# Patient Record
Sex: Female | Born: 1940
Health system: Southern US, Community
[De-identification: ages and names within clinical notes are randomized; demographics above are authoritative.]

## PROBLEM LIST (undated history)

## (undated) DIAGNOSIS — Z860100 Personal history of colon polyps, unspecified: Secondary | ICD-10-CM

## (undated) DIAGNOSIS — R110 Nausea: Secondary | ICD-10-CM

## (undated) DIAGNOSIS — F419 Anxiety disorder, unspecified: Secondary | ICD-10-CM

## (undated) DIAGNOSIS — R21 Rash and other nonspecific skin eruption: Secondary | ICD-10-CM

## (undated) DIAGNOSIS — I493 Ventricular premature depolarization: Secondary | ICD-10-CM

## (undated) DIAGNOSIS — K219 Gastro-esophageal reflux disease without esophagitis: Secondary | ICD-10-CM

## (undated) DIAGNOSIS — M549 Dorsalgia, unspecified: Secondary | ICD-10-CM

## (undated) DIAGNOSIS — C50919 Malignant neoplasm of unspecified site of unspecified female breast: Secondary | ICD-10-CM

## (undated) DIAGNOSIS — R351 Nocturia: Secondary | ICD-10-CM

## (undated) DIAGNOSIS — Z9889 Other specified postprocedural states: Secondary | ICD-10-CM

## (undated) DIAGNOSIS — E039 Hypothyroidism, unspecified: Secondary | ICD-10-CM

## (undated) DIAGNOSIS — L509 Urticaria, unspecified: Secondary | ICD-10-CM

## (undated) DIAGNOSIS — F329 Major depressive disorder, single episode, unspecified: Secondary | ICD-10-CM

## (undated) DIAGNOSIS — K5790 Diverticulosis of intestine, part unspecified, without perforation or abscess without bleeding: Secondary | ICD-10-CM

## (undated) DIAGNOSIS — M1990 Unspecified osteoarthritis, unspecified site: Secondary | ICD-10-CM

## (undated) DIAGNOSIS — R0602 Shortness of breath: Secondary | ICD-10-CM

## (undated) DIAGNOSIS — Z9289 Personal history of other medical treatment: Secondary | ICD-10-CM

## (undated) DIAGNOSIS — R350 Frequency of micturition: Secondary | ICD-10-CM

## (undated) DIAGNOSIS — I10 Essential (primary) hypertension: Secondary | ICD-10-CM

## (undated) DIAGNOSIS — T7840XA Allergy, unspecified, initial encounter: Secondary | ICD-10-CM

## (undated) DIAGNOSIS — F32A Depression, unspecified: Secondary | ICD-10-CM

## (undated) DIAGNOSIS — E785 Hyperlipidemia, unspecified: Secondary | ICD-10-CM

## (undated) DIAGNOSIS — R42 Dizziness and giddiness: Secondary | ICD-10-CM

## (undated) DIAGNOSIS — G8929 Other chronic pain: Secondary | ICD-10-CM

## (undated) DIAGNOSIS — R112 Nausea with vomiting, unspecified: Secondary | ICD-10-CM

## (undated) DIAGNOSIS — Z8619 Personal history of other infectious and parasitic diseases: Secondary | ICD-10-CM

## (undated) HISTORY — DX: Allergy, unspecified, initial encounter: T78.40XA

## (undated) HISTORY — DX: Malignant neoplasm of unspecified site of unspecified female breast: C50.919

## (undated) HISTORY — PX: OTHER SURGICAL HISTORY: SHX169

## (undated) HISTORY — PX: COLONOSCOPY: SHX174

## (undated) HISTORY — DX: Ventricular premature depolarization: I49.3

## (undated) HISTORY — DX: Personal history of other medical treatment: Z92.89

## (undated) HISTORY — DX: Rash and other nonspecific skin eruption: R21

## (undated) HISTORY — PX: HEMORRHOID SURGERY: SHX153

## (undated) HISTORY — DX: Urticaria, unspecified: L50.9

---

## 1970-07-05 HISTORY — PX: BREAST SURGERY: SHX581

## 1999-07-06 HISTORY — PX: ABDOMINAL HYSTERECTOMY: SHX81

## 2001-03-24 ENCOUNTER — Ambulatory Visit (HOSPITAL_COMMUNITY): Admission: RE | Admit: 2001-03-24 | Discharge: 2001-03-24 | Payer: Self-pay | Admitting: Obstetrics and Gynecology

## 2001-03-24 ENCOUNTER — Encounter: Payer: Self-pay | Admitting: Obstetrics and Gynecology

## 2001-04-18 ENCOUNTER — Other Ambulatory Visit: Admission: RE | Admit: 2001-04-18 | Discharge: 2001-04-18 | Payer: Self-pay | Admitting: Obstetrics and Gynecology

## 2001-06-09 ENCOUNTER — Ambulatory Visit (HOSPITAL_COMMUNITY): Admission: RE | Admit: 2001-06-09 | Discharge: 2001-06-09 | Payer: Self-pay | Admitting: Internal Medicine

## 2002-04-09 ENCOUNTER — Encounter: Payer: Self-pay | Admitting: Family Medicine

## 2002-04-09 ENCOUNTER — Ambulatory Visit (HOSPITAL_COMMUNITY): Admission: RE | Admit: 2002-04-09 | Discharge: 2002-04-09 | Payer: Self-pay | Admitting: Family Medicine

## 2003-05-17 ENCOUNTER — Ambulatory Visit (HOSPITAL_COMMUNITY): Admission: RE | Admit: 2003-05-17 | Discharge: 2003-05-17 | Payer: Self-pay | Admitting: General Surgery

## 2003-06-01 ENCOUNTER — Emergency Department (HOSPITAL_COMMUNITY): Admission: EM | Admit: 2003-06-01 | Discharge: 2003-06-01 | Payer: Self-pay | Admitting: Emergency Medicine

## 2003-06-06 ENCOUNTER — Ambulatory Visit (HOSPITAL_COMMUNITY): Admission: RE | Admit: 2003-06-06 | Discharge: 2003-06-06 | Payer: Self-pay | Admitting: Internal Medicine

## 2004-01-20 ENCOUNTER — Ambulatory Visit (HOSPITAL_COMMUNITY): Admission: RE | Admit: 2004-01-20 | Discharge: 2004-01-20 | Payer: Self-pay | Admitting: Family Medicine

## 2004-11-27 ENCOUNTER — Ambulatory Visit (HOSPITAL_COMMUNITY): Admission: RE | Admit: 2004-11-27 | Discharge: 2004-11-27 | Payer: Self-pay | Admitting: Family Medicine

## 2005-02-04 ENCOUNTER — Ambulatory Visit: Payer: Self-pay | Admitting: Internal Medicine

## 2005-02-11 ENCOUNTER — Ambulatory Visit (HOSPITAL_COMMUNITY): Admission: RE | Admit: 2005-02-11 | Discharge: 2005-02-11 | Payer: Self-pay | Admitting: Internal Medicine

## 2005-02-11 ENCOUNTER — Ambulatory Visit: Payer: Self-pay | Admitting: Internal Medicine

## 2007-03-31 ENCOUNTER — Ambulatory Visit (HOSPITAL_COMMUNITY): Admission: RE | Admit: 2007-03-31 | Discharge: 2007-03-31 | Payer: Self-pay | Admitting: Internal Medicine

## 2007-07-06 HISTORY — PX: TRANSTHORACIC ECHOCARDIOGRAM: SHX275

## 2008-12-30 ENCOUNTER — Ambulatory Visit (HOSPITAL_COMMUNITY): Admission: RE | Admit: 2008-12-30 | Discharge: 2008-12-30 | Payer: Self-pay | Admitting: Family Medicine

## 2009-07-05 DIAGNOSIS — Z8619 Personal history of other infectious and parasitic diseases: Secondary | ICD-10-CM

## 2009-07-05 HISTORY — DX: Personal history of other infectious and parasitic diseases: Z86.19

## 2010-11-03 DIAGNOSIS — Z9289 Personal history of other medical treatment: Secondary | ICD-10-CM

## 2010-11-03 HISTORY — DX: Personal history of other medical treatment: Z92.89

## 2010-11-20 NOTE — Op Note (Signed)
   NAME:  Leamer, SHANVI MOYD                          ACCOUNT NO.:  [id number]   MEDICAL RECORD NO.:  [id number]                   PATIENT TYPE:  AMB   LOCATION:  DAY                                  FACILITY:  [facility]   PHYSICIAN:  Dalia Heading, M.D.               DATE OF BIRTH:  May 12, 1941   DATE OF PROCEDURE:  05/17/2003  DATE OF DISCHARGE:                                 OPERATIVE REPORT   PREOPERATIVE DIAGNOSIS:  Hemorrhoidal disease.   POSTOPERATIVE DIAGNOSIS:  Hemorrhoidal disease.   PROCEDURE:  Extensive hemorrhoidectomy.   SURGEON:  Dalia Heading, M.D.   ANESTHESIA:  General endotracheal.   INDICATIONS FOR PROCEDURE:  The patient is a 70 year old white female who  presents for treatment of intractable hemorrhoidal disease.  The risks and  benefits of the procedure, including bleeding, infection, and recurrence of  the hemorrhoidal disease were fully explained to the patient who gave  informed consent.   DESCRIPTION OF PROCEDURE:  The patient was placed in the lithotomy position  after the induction of general endotracheal anesthesia.  The perineum was  prepped and draped using the usual sterile technique with Betadine.  Surgical site confirmation was performed.   The patient had previous hemorrhoid surgery along the left aspect of the  anus.  She had internal and external hemorrhoid disease at both the 7  o'clock and 11 o'clock positions.  Both internal and external hemorrhoids in  these areas were excised.  Any bleeding was controlled using Bovie  electrocautery.  The mucosal edges were reapproximated using 2-0 Vicryl  running suture.  Sensorcaine 0.5% was instilled into the surrounding  perineum.  No other mass lesions were noted.  Surgicel and viscous Xylocaine  packing was placed into the rectum.   The patient tolerated the procedure well.  The patient was extubated in the  operating room and went back to the recovery room awake and in stable  condition.   COMPLICATIONS:  None.    SPECIMENS:  Hemorrhoids.   ESTIMATED BLOOD LOSS:  Minimal.      ___________________________________________                                            Dalia Heading, M.D.   MAJ/MEDQ  D:  05/17/2003  T:  05/17/2003  Job:  [id number]   cc:   Patrica Duel, M.D.  [address]  [place]  [place] [address]  Fax: [phone]

## 2010-11-20 NOTE — Op Note (Signed)
NAME:  Jane Wilkerson, Jane Wilkerson                ACCOUNT NO.:  [id number]   MEDICAL RECORD NO.:  [id number]          PATIENT TYPE:  AMB   LOCATION:  DAY                           FACILITY:  [facility]   PHYSICIAN:  Lionel December, M.D.    DATE OF BIRTH:  April 22, 1941   DATE OF PROCEDURE:  02/11/2005  DATE OF DISCHARGE:                                 OPERATIVE REPORT   PROCEDURE:  Colonoscopy.   INDICATIONS:  [name] is a 70 year old Caucasian female with a family history  of colon carcinoma in her mother.  Her last colonoscopy was in June 2000.  On that exam, she had few sigmoid diverticula and four polyps, all of which  were hyperplastic.  She is complaining of intermittent LUQ pain with some  change in her bowel habits.  It is suspected that she may have IBS.  The  procedure risks were reviewed with the patient and informed consent was  obtained.   PREMEDICATION:  Demerol 50 mg IV Versed 5 mg IV.   FINDINGS:  Procedure performed in endoscopy suite.  The patient's vital  signs and O2 saturation were monitored during the procedure and remained  stable.  The patient was placed left lateral recumbent position and a rectal  examination performed.  No abnormality noted on external or digital exam.  The Olympus video scope was placed in the rectum and advanced under vision  into sigmoid colon and beyond.  The preparation was felt to be satisfactory.  She had few tiny diverticula at sigmoid colon.  As the scope was passed  across the splenic flexure, she was complaining of some discomfort, the same  pain that she has had off and on recently.  The scope was advanced into the  cecum, which was identified by ileocecal valve and appendiceal orifice.  Pictures taken for the record.  As the scope was withdrawn, the colonic  mucosa was carefully examined.  There was a tiny polyp in mid-transverse  colon, which was seen on the way out; however, as I got ready to remove it  via cold biopsy, I was not able to find it.  Mucosa of the rest of the colon was normal.  Rectal mucosa similarly was  normal.  The scope was retroflexed to examine anorectal junction, which was  unremarkable.  Endoscope was straightened and withdrawn.  The patient  tolerated the procedure well.   FINAL DIAGNOSES:  1.  Few tiny diverticula at sigmoid colon.  2.  Tiny polyp noted at midtransverse colon but could not be found again to      remove it.  It appeared to be hyperplastic.   RECOMMENDATIONS:  1.  High-fiber diet.  2.  Continue Levsin SL t.i.d. p.r.n.  3.  If she remains with pain, will consider abdominal-pelvic CT.  She will      call our office with a progress report in three to four weeks.       NR/MEDQ  D:  02/11/2005  T:  02/11/2005  Job:  [id number]   cc:   Patrica Duel, M.D.  [address], [address]  [place] [address]  Fax: [phone]

## 2010-11-20 NOTE — H&P (Signed)
NAME:  Jane Wilkerson                ACCOUNT NO.:  [id number]   MEDICAL RECORD NO.:  [id number]          PATIENT TYPE:  AMB   LOCATION:                                FACILITY:  [facility]   PHYSICIAN:  R. Roetta Sessions, M.D. DATE OF BIRTH:  October 12, 1940   DATE OF ADMISSION:  DATE OF DISCHARGE:  LH                                HISTORY & PHYSICAL   PRIMARY CARE PHYSICIAN:  Dr. Nobie Putnam.   CHIEF COMPLAINT:  Colonoscopy, family history of colon cancer.   HISTORY OF PRESENT ILLNESS:  Jane Wilkerson is a 70 year old Caucasian female,  patient of Dr. Patty Sermons, with history of irritable bowel syndrome. She  presents today requesting followup colonoscopy which was due last year. She  had initial colonoscopy by Dr. Karilyn Cota on December 26, 1998. She was found to  have a few small diverticula in the sigmoid colon, four small polyps - two  at the transverse and two at the rectum were benign and hyperplastic. She  has continued to do well. More recently, she has been under a significant  amount of stress at work. She has noticed some aching left upper quadrant  and left sided abdominal pain that lasts a few minutes generally. She also  has had some diarrhea and has been on antibiotics recently. She continues to  complain of postprandial fecal urgency with bowel movements about two to  three times a day. Denies any rectal bleeding, melena, nausea, or vomiting.   PAST MEDICAL HISTORY:  1.  Hypertension.  2.  GERD.  3.  IBS.  4.  Panic attacks.  5.  Hypercholesterolemia  6.  Hypothyroidism.  7.  Arthritis.  8.  Complete hysterectomy with bladder tack and hemorrhoidectomy.  9.  She had an EGD June 09, 2001 by Dr. Karilyn Cota. She was found to have      ulcerative esophagitis with small hiatal hernia.   CURRENT MEDICATIONS:  1.  Estradiol 0.5 mg daily.  2.  Avapro 150 mg daily.  3.  Ziac 2.5 mg daily.  4.  Nexium 40 mg b.i.d.  5.  Lorazepam 1 mg p.r.n.  6.  Zocor 20 mg daily.  7.  Allegra 180 mg daily.   ALLERGIES:  PENICILLIN.   FAMILY HISTORY:  Positive for mother diagnosed with colon cancer at age 45.  She has one son with hypertension and one son with diabetes mellitus. Father  deceased at age 32.   SOCIAL HISTORY:  [name] has been married for 44 years. She has three  children. She is employed full time with [employer]. She denies any  tobacco or drug use. She reports occasional alcohol use.   REVIEW OF SYSTEMS:  CONSTITUTIONAL:  Weight is stable. She is complaining of  some anorexia. Denies any fever or chills. CARDIOVASCULAR:  Denies any chest  pain or palpitations. PULMONARY:  Denies shortness of breath, dyspnea,  cough, or hemoptysis. GASTROINTESTINAL:  See HPI. She does have history of  chronic GERD. Her symptoms are well controlled on Nexium 40 mg daily.  EXTREMITIES:  She is complaining of frequent leg cramps, especially  while at  work, in both legs. Potassium was recently checked by her primary care  Jeanmarie Mccowen which was normal.   PHYSICAL EXAMINATION:  VITAL SIGNS:  Weight 149 pounds, height 62 inches,  temperature 98.2, blood pressure 122/80, pulse 64.  GENERAL:  [name] is a 70 year old Caucasian female who is alert,  oriented, pleasant, cooperative in no acute distress.  HEENT:  Sclerae are clear, nonicteric. Conjunctivae pink. Oropharynx pink  and moist without any lesions.  NECK:  Supple without any masses or thyromegaly.  CHEST:  Heart regular rate and rhythm with normal S1 and S2 without murmurs,  clicks, rubs, or gallops. Lungs are clear to auscultation bilaterally.  ABDOMEN:  Positive bowel sounds x4. No bruits auscultated. Soft, nontender,  nondistended. ________________ hepatosplenomegaly. No rebound tenderness or  guarding.  RECTAL:  Deferred.  EXTREMITIES:  Without clubbing or edema bilaterally.  SKIN:  Pink, warm and dry without any rash or jaundice.   IMPRESSION:  [name] is a 70 year old Caucasian female with a family  history of colon  cancer. She is due for repeat colonoscopy. She has a  history of IBS and has been under a significant amount of stress lately. She  is having some left upper quadrant and left sided abdominal pain and loose  stools which I suspect is due to her IBS. We will give a trial of  antispasmodics and see how she does. She also has chronic GERD which is well  controlled.   PLAN:  1.  Prescription for Levsin 1 sublingual up to t.i.d. p.r.n. abdominal      cramping and diarrhea #60 with 1 refill.  2.  She is to continue Nexium 40 mg b.i.d.  3.  Will schedule colonoscopy with Dr. Karilyn Cota in the near future. I have      discussed the procedure including the risks and benefits which include      but are not limited to bleeding, infection, perforation, and drug      reaction. She agrees with this plan, and consent will be obtained.       KC/MEDQ  D:  02/04/2005  T:  02/04/2005  Job:  [id number]   cc:   Patrica Duel, M.D.  [address]  [place]  [place] [address]  Fax: [phone]

## 2010-11-20 NOTE — H&P (Signed)
NAME:  Jane Wilkerson, Jane Wilkerson NO.:  [id number]   MEDICAL RECORD NO.:  [id number]                  PATIENT TYPE:   LOCATION:                                       FACILITY:   PHYSICIAN:  Dalia Heading, M.D.               DATE OF BIRTH:   DATE OF ADMISSION:  DATE OF DISCHARGE:                                HISTORY & PHYSICAL   CHIEF COMPLAINT:  Hemorrhoidal disease.   HISTORY OF PRESENT ILLNESS:  The patient is a 70 year old white female who  was referred for evaluation treatment of intractable hemorrhoidal disease.  She has had hemorrhoidal disease for years, but recently it has become more  prominent and painful.  It is made worse with constipation or straining.  Medications have not been helpful.  No bleeding is noted.  She had a  colonoscopy several years ago which was unremarkable.   PAST MEDICAL HISTORY:  Includes:  1. Reflux disease.  2. Hypertension.  3. Hypothyroidism.   PAST SURGICAL HISTORY:  Hysterectomy.   CURRENT MEDICATIONS:  1. Prevacid 30 mg p.o. every day.  2. Meprobamate 400 mg p.o. every day.  3. Synthroid 0.05 mg p.o. every day.  4. Avapro 1/2 tablet p.o. every day.  5. Estradiol 1 mg p.o. every day.  6. Ziac 2.5 mg p.o. every day.  7. Zocor 20 mg p.o. every day.   ALLERGIES:  PENICILLIN though she is unsure of her reaction from many years  ago.   REVIEW OF SYSTEMS:  Patient denies drinking or smoking.  She denies any  other cardiopulmonary difficulties except as listed.   PHYSICAL EXAMINATION:  GENERAL:  The patient is a well-developed, well-  nourished, white female in no acute distress.  VITAL SIGNS:  She is afebrile and vital signs are stable.  LUNGS:  Clear to auscultation with equal breath sounds bilaterally.  HEART:  Regular, rate and rhythm without S3, S4, or murmurs.  ABDOMEN:  Benign.  RECTAL:  Reveals hemorrhoidal disease circumferentially but most prominently  along the right lateral aspect.  Internal and  external hemorrhoidal disease  is noted in this area.  No other masses are noted.  The stool is hemoccult  negative.   IMPRESSION:  Hemorrhoidal disease.   PLAN:  The patient is scheduled for a hemorrhoidectomy on May 17, 2003.  The risks and benefits of the procedure including bleeding, infection, and  recurrence of the hemorrhoidal disease were fully explained to the patient,  gave informed consent.     ___________________________________________                                         Dalia Heading, M.D.   MAJ/MEDQ  D:  05/14/2003  T:  05/14/2003  Job:  [id number]   cc:   Patrica Duel, M.D.  [address], [address]  [place]  [place] [address]  Fax: [phone]

## 2011-04-21 ENCOUNTER — Other Ambulatory Visit (HOSPITAL_COMMUNITY): Payer: Self-pay | Admitting: Internal Medicine

## 2011-04-21 DIAGNOSIS — Z139 Encounter for screening, unspecified: Secondary | ICD-10-CM

## 2011-04-27 ENCOUNTER — Other Ambulatory Visit (HOSPITAL_COMMUNITY): Payer: Self-pay

## 2011-04-27 ENCOUNTER — Ambulatory Visit (HOSPITAL_COMMUNITY): Payer: Self-pay

## 2011-04-28 ENCOUNTER — Other Ambulatory Visit (HOSPITAL_COMMUNITY): Payer: Self-pay

## 2011-05-04 ENCOUNTER — Ambulatory Visit (HOSPITAL_COMMUNITY)
Admission: RE | Admit: 2011-05-04 | Discharge: 2011-05-04 | Disposition: A | Discharge status: Home / Self Care | Payer: Medicare Other | Source: Ambulatory Visit | Attending: Internal Medicine | Admitting: Internal Medicine

## 2011-05-04 DIAGNOSIS — Z139 Encounter for screening, unspecified: Secondary | ICD-10-CM

## 2011-05-04 DIAGNOSIS — Z780 Asymptomatic menopausal state: Secondary | ICD-10-CM | POA: Insufficient documentation

## 2011-05-04 DIAGNOSIS — M899 Disorder of bone, unspecified: Secondary | ICD-10-CM | POA: Insufficient documentation

## 2011-05-04 DIAGNOSIS — Z1231 Encounter for screening mammogram for malignant neoplasm of breast: Secondary | ICD-10-CM | POA: Insufficient documentation

## 2011-05-04 DIAGNOSIS — Z13820 Encounter for screening for osteoporosis: Secondary | ICD-10-CM | POA: Insufficient documentation

## 2011-05-13 ENCOUNTER — Other Ambulatory Visit: Payer: Self-pay | Admitting: Internal Medicine

## 2011-05-13 DIAGNOSIS — R928 Other abnormal and inconclusive findings on diagnostic imaging of breast: Secondary | ICD-10-CM

## 2011-06-02 ENCOUNTER — Ambulatory Visit (HOSPITAL_COMMUNITY)
Admission: RE | Admit: 2011-06-02 | Discharge: 2011-06-02 | Disposition: A | Discharge status: Home / Self Care | Payer: Medicare Other | Source: Ambulatory Visit | Attending: Internal Medicine | Admitting: Internal Medicine

## 2011-06-02 ENCOUNTER — Other Ambulatory Visit: Payer: Self-pay | Admitting: Internal Medicine

## 2011-06-02 DIAGNOSIS — R921 Mammographic calcification found on diagnostic imaging of breast: Secondary | ICD-10-CM

## 2011-06-02 DIAGNOSIS — R928 Other abnormal and inconclusive findings on diagnostic imaging of breast: Secondary | ICD-10-CM

## 2011-06-08 ENCOUNTER — Ambulatory Visit
Admission: RE | Admit: 2011-06-08 | Discharge: 2011-06-08 | Disposition: A | Discharge status: Home / Self Care | Payer: Medicare Other | Source: Ambulatory Visit | Attending: Internal Medicine | Admitting: Internal Medicine

## 2011-06-08 DIAGNOSIS — R921 Mammographic calcification found on diagnostic imaging of breast: Secondary | ICD-10-CM

## 2011-06-09 ENCOUNTER — Other Ambulatory Visit: Payer: Self-pay | Admitting: Internal Medicine

## 2011-06-09 DIAGNOSIS — C50911 Malignant neoplasm of unspecified site of right female breast: Secondary | ICD-10-CM

## 2011-06-10 ENCOUNTER — Other Ambulatory Visit: Payer: Self-pay | Admitting: *Deleted

## 2011-06-10 ENCOUNTER — Telehealth: Payer: Self-pay | Admitting: *Deleted

## 2011-06-10 DIAGNOSIS — D0510 Intraductal carcinoma in situ of unspecified breast: Secondary | ICD-10-CM

## 2011-06-10 NOTE — Telephone Encounter (Signed)
Confirmed [facility] for 06/16/11 at 0815.  Instructions and contact information given.  

## 2011-06-15 ENCOUNTER — Ambulatory Visit
Admission: RE | Admit: 2011-06-15 | Discharge: 2011-06-15 | Disposition: A | Discharge status: Home / Self Care | Payer: Medicare Other | Source: Ambulatory Visit | Attending: Internal Medicine | Admitting: Internal Medicine

## 2011-06-15 DIAGNOSIS — C50911 Malignant neoplasm of unspecified site of right female breast: Secondary | ICD-10-CM

## 2011-06-15 MED ORDER — GADOBENATE DIMEGLUMINE 529 MG/ML IV SOLN
7.0000 mL | Freq: Once | INTRAVENOUS | Status: AC | PRN
  Administered 2011-06-15: 7 mL via INTRAVENOUS

## 2011-06-16 ENCOUNTER — Ambulatory Visit (HOSPITAL_BASED_OUTPATIENT_CLINIC_OR_DEPARTMENT_OTHER): Payer: Medicare Other

## 2011-06-16 ENCOUNTER — Ambulatory Visit
Admission: RE | Admit: 2011-06-16 | Discharge: 2011-06-16 | Disposition: A | Discharge status: Home / Self Care | Payer: Medicare Other | Source: Ambulatory Visit | Attending: Radiation Oncology | Admitting: Radiation Oncology

## 2011-06-16 ENCOUNTER — Ambulatory Visit (HOSPITAL_BASED_OUTPATIENT_CLINIC_OR_DEPARTMENT_OTHER): Payer: Medicare Other | Admitting: Oncology

## 2011-06-16 ENCOUNTER — Other Ambulatory Visit: Payer: Medicare Other | Admitting: Lab

## 2011-06-16 ENCOUNTER — Encounter: Payer: Self-pay | Admitting: Oncology

## 2011-06-16 ENCOUNTER — Ambulatory Visit (HOSPITAL_BASED_OUTPATIENT_CLINIC_OR_DEPARTMENT_OTHER): Payer: Medicare Other | Admitting: Surgery

## 2011-06-16 ENCOUNTER — Ambulatory Visit: Payer: Medicare Other | Attending: Surgery | Admitting: Physical Therapy

## 2011-06-16 ENCOUNTER — Telehealth: Payer: Self-pay | Admitting: Oncology

## 2011-06-16 ENCOUNTER — Encounter: Payer: Self-pay | Admitting: *Deleted

## 2011-06-16 ENCOUNTER — Encounter (INDEPENDENT_AMBULATORY_CARE_PROVIDER_SITE_OTHER): Payer: Self-pay | Admitting: Surgery

## 2011-06-16 VITALS — BP 150/84 | HR 69 | Temp 97.9°F | Ht 60.5 in | Wt 152.7 lb

## 2011-06-16 VITALS — BP 150/84 | HR 69 | Temp 97.9°F | Ht 64.0 in | Wt 152.7 lb

## 2011-06-16 DIAGNOSIS — C50919 Malignant neoplasm of unspecified site of unspecified female breast: Secondary | ICD-10-CM | POA: Insufficient documentation

## 2011-06-16 DIAGNOSIS — M40299 Other kyphosis, site unspecified: Secondary | ICD-10-CM | POA: Insufficient documentation

## 2011-06-16 DIAGNOSIS — Z800 Family history of malignant neoplasm of digestive organs: Secondary | ICD-10-CM | POA: Insufficient documentation

## 2011-06-16 DIAGNOSIS — Z170 Estrogen receptor positive status [ER+]: Secondary | ICD-10-CM | POA: Insufficient documentation

## 2011-06-16 DIAGNOSIS — D0510 Intraductal carcinoma in situ of unspecified breast: Secondary | ICD-10-CM

## 2011-06-16 DIAGNOSIS — R293 Abnormal posture: Secondary | ICD-10-CM | POA: Insufficient documentation

## 2011-06-16 DIAGNOSIS — D0590 Unspecified type of carcinoma in situ of unspecified breast: Secondary | ICD-10-CM | POA: Insufficient documentation

## 2011-06-16 DIAGNOSIS — C50419 Malignant neoplasm of upper-outer quadrant of unspecified female breast: Secondary | ICD-10-CM

## 2011-06-16 DIAGNOSIS — E039 Hypothyroidism, unspecified: Secondary | ICD-10-CM | POA: Insufficient documentation

## 2011-06-16 DIAGNOSIS — D0511 Intraductal carcinoma in situ of right breast: Secondary | ICD-10-CM | POA: Insufficient documentation

## 2011-06-16 DIAGNOSIS — Z853 Personal history of malignant neoplasm of breast: Secondary | ICD-10-CM

## 2011-06-16 DIAGNOSIS — Z79899 Other long term (current) drug therapy: Secondary | ICD-10-CM | POA: Insufficient documentation

## 2011-06-16 DIAGNOSIS — C50411 Malignant neoplasm of upper-outer quadrant of right female breast: Secondary | ICD-10-CM

## 2011-06-16 DIAGNOSIS — IMO0001 Reserved for inherently not codable concepts without codable children: Secondary | ICD-10-CM | POA: Insufficient documentation

## 2011-06-16 LAB — COMPREHENSIVE METABOLIC PANEL
ALT: 14 U/L (ref 0–35)
Albumin: 4.1 g/dL (ref 3.5–5.2)
BUN: 10 mg/dL (ref 6–23)
CO2: 32 mEq/L (ref 19–32)
Calcium: 9.9 mg/dL (ref 8.4–10.5)
Chloride: 97 mEq/L (ref 96–112)
Creatinine, Ser: 1.14 mg/dL — ABNORMAL HIGH (ref 0.50–1.10)
Potassium: 3.5 mEq/L (ref 3.5–5.3)

## 2011-06-16 LAB — CBC WITH DIFFERENTIAL/PLATELET
BASO%: 0.5 % (ref 0.0–2.0)
Basophils Absolute: 0 10*3/uL (ref 0.0–0.1)
HCT: 37.2 % (ref 34.8–46.6)
HGB: 12.6 g/dL (ref 11.6–15.9)
MONO#: 0.8 10*3/uL (ref 0.1–0.9)
NEUT%: 62.9 % (ref 38.4–76.8)
WBC: 6.1 10*3/uL (ref 3.9–10.3)
lymph#: 1.3 10*3/uL (ref 0.9–3.3)

## 2011-06-16 NOTE — Progress Notes (Signed)
CC:   Jane Wilkerson, M.D. Jane Wilkerson, M.D. Jane Rear. Sherwood Gambler, MD Jane Hilty, MD  DIAGNOSIS:  Ductal carcinoma in situ of the right breast.  PREVIOUS INTERVENTION:  Right core needle biopsy 06/08/2011 revealing ductal carcinoma in situ.  HISTORY OF PRESENT ILLNESS:  Jane Wilkerson is a pleasant 70 year old female who underwent a screening mammogram.  She has found to have calcifications in the right lower inner quadrant.  These were biopsied and found to be high-grade DCIS with necrosis.  They were ER positive, PR negative.  An MRI confirmed the presence of a 2.4 x 2 x 1.2 cm mass. Nothing was seen on the left breast.  There were no enlarged lymph nodes.  She [facility] for consideration of treatment options in her newly diagnosed DCIS.  She has recovered well from her biopsy.  She is accompanied by her husband and her daughter.  She reports no pain or breast-related complaints prior to her mammogram.  PAST MEDICAL HISTORY: 1. Hypothyroidism. 2. Anxiety. 3. Eczema.  MEDICATIONS:  Aspirin, fish oil, Bystolic, estradiol, Cozaar, ProAir inhaler, levothyroxine, hydrochlorothiazide, calcium, lorazepam, Nexium, simvastatin, lidocaine, hydrocortisone cream.  ALLERGIES:  Penicillin causes a rash.  FAMILY HISTORY:  Her father had pancreatic cancer at 43.  Her mother had colon cancer at 17.  SOCIAL HISTORY:  She is retired.  She is accompanied by her husband and her daughter.  She has 3 children.  She denies any alcohol or tobacco use.  GYN HISTORY:  She had menses at 14.  She underwent menopause after her hysterectomy and has been on hormone replacement therapy for 10 years. She is currently taking estradiol.  She is GX, P3, with her 1st pregnancy at 70.  REVIEW OF SYSTEMS:  Positive for fatigue, cramping, glasses, palpitations, feet swelling, shortness of breath, three-pillow orthopnea, heartburn, history of ulcer, incontinence, breast lump,  easy bruising, arthritis, anxiety, and hypothyroidism.  All other systems were reviewed and found to be negative.  PHYSICAL EXAMINATION:  General:  She is a pleasant female in no distress, sitting comfortably on the exam room table.  Vitals Signs: Weight 152 pounds.  Height 5 feet 0 inches.  Blood pressure 150/84, pulse 69, respirations 20, temperature 97.9.  She has no palpable cervical or supraclavicular adenopathy.  She has ptotic breasts bilaterally.  She has no palpable abnormalities of her bilateral breasts.  She is alert and oriented x3.  IMPRESSION:  Ductal carcinoma in situ of right breast.  RECOMMENDATIONS:  Jane Wilkerson has a newly diagnosed ductal carcinoma in situ of the right breast.  We discussed the role of breast conservation in the management of this disease.  We discussed the role of radiation in decreasing local failures.  We discussed 30-35 treatments as an outpatient.  We discussed the process of simulation and the placement of tattoos.  Her surgery is going to be scheduled after the holidays, which I think is fine.  We discussed the excellent prognosis associated with ductal carcinoma in situ and the low likelihood of any spread to other parts of her body.  We discussed treatment in [place] versus treatment here at [facility].  She and her family are going to discuss this further.  We discussed enrollment in B43 and she will be meeting with Jane Wilkerson to discuss that as well.  We discussed possible side effects of treatment including but not limited to skin redness and fatigue.  She will follow up with me after her surgery.  I asked her again to  see me at [facility] if she wished to be treated there or here at [facility] if she wishes to be treated here.  I have also recommended that she stopped taking her estradiol.    ______________________________ Jane Wilkerson, M.D. SW/MEDQ  D:  06/16/2011  T:  06/16/2011  Job:  [id number]

## 2011-06-16 NOTE — Progress Notes (Signed)
Patient ID: Jane Wilkerson, female   DOB: 03/24/1941, 70 y.o.   MRN: [id number]  Chief Complaint  Patient presents with  . Breast Cancer    HPI Jane Wilkerson is a 70 y.o. female.   HPI This is a very pleasant female referred by Dr. Sherwood Gambler for evaluation of abnormal calcifications in her right breast. She had undergone normal screening mammography with calcifications were found. She was uncertain of her last mammograms.  She has had no problems with her breasts. She denies nipple discharge. She had a benign biopsy of the left breast in the 1970s. She is otherwise without complaints.  No past medical history on file.  Past Surgical History  Procedure Date  . Abdominal hysterectomy     Family History  Problem Relation Age of Onset  . Cancer Mother     colon  . Cancer Father     pancreatic    Social History History  Substance Use Topics  . Smoking status: Never Smoker   . Smokeless tobacco: Not on file  . Alcohol Use: No    Allergies  Allergen Reactions  . Penicillins Rash    Current Outpatient Prescriptions  Medication Sig Dispense Refill  . albuterol (PROVENTIL HFA;VENTOLIN HFA) 108 (90 BASE) MCG/ACT inhaler Inhale 2 puffs into the lungs every 6 (six) hours as needed.        Marland Kitchen aspirin 81 MG tablet Take 81 mg by mouth daily.        . Calcium Carbonate-Vit D-Min (CALCIUM 1200 PO) Take by mouth.        . esomeprazole (NEXIUM) 40 MG capsule Take 40 mg by mouth daily before breakfast.        . estradiol (VIVELLE-DOT) 0.05 MG/24HR Place 1 patch onto the skin once a week.        . fish oil-omega-3 fatty acids 1000 MG capsule Take 2 g by mouth daily.        . hydrochlorothiazide (HYDRODIURIL) 25 MG tablet Take 25 mg by mouth daily.        . hydrocortisone cream 0.5 % Apply topically 2 (two) times daily.        Marland Kitchen levothyroxine (SYNTHROID, LEVOTHROID) 50 MCG tablet Take 50 mcg by mouth daily.        Marland Kitchen LORazepam (ATIVAN) 1 MG tablet Take 1 mg by mouth every 4 (four) hours.        Marland Kitchen  losartan (COZAAR) 100 MG tablet Take 100 mg by mouth daily.        . nebivolol (BYSTOLIC) 10 MG tablet Take 10 mg by mouth daily.        . simvastatin (ZOCOR) 40 MG tablet Take 40 mg by mouth at bedtime.         No current facility-administered medications for this visit.   Facility-Administered Medications Ordered in Other Visits  Medication Dose Route Frequency Provider Last Rate Last Dose  . gadobenate dimeglumine (MULTIHANCE) injection 7 mL  7 mL Intravenous Once PRN Medication Radiologist   7 mL at 06/15/11 1004    Review of Systems Review of Systems  HENT: Positive for postnasal drip.   Respiratory: Positive for shortness of breath.   Cardiovascular: Positive for palpitations.  Gastrointestinal: Negative.   Genitourinary: Negative.   Musculoskeletal: Positive for arthralgias.  Neurological: Negative.   Hematological: Bruises/bleeds easily.  Psychiatric/Behavioral: Negative.     Blood pressure 150/84, pulse 69, temperature 97.9 F (36.6 C), height 5\' 4"  (1.626 m), weight 152 lb 11.2 oz (69.264  kg).  Physical Exam Physical Exam  Constitutional: She is oriented to person, place, and time. She appears well-developed and well-nourished. No distress.  HENT:  Head: Normocephalic and atraumatic.  Right Ear: External ear normal.  Left Ear: External ear normal.  Nose: Nose normal.  Mouth/Throat: Oropharynx is clear and moist. No oropharyngeal exudate.  Eyes: Conjunctivae are normal. Pupils are equal, round, and reactive to light. No scleral icterus.  Neck: Normal range of motion. Neck supple. No tracheal deviation present. No thyromegaly present.  Cardiovascular: Normal rate, regular rhythm, normal heart sounds and intact distal pulses.   No murmur heard. Pulmonary/Chest: Effort normal and breath sounds normal. No respiratory distress. She has no wheezes. She has no rales.  Abdominal: Soft. Bowel sounds are normal. She exhibits no distension. There is no tenderness.    Musculoskeletal: She exhibits no edema and no tenderness.  Lymphadenopathy:    She has no cervical adenopathy.    She has no axillary adenopathy.       Right: No supraclavicular adenopathy present.       Left: No supraclavicular adenopathy present.  Neurological: She is alert and oriented to person, place, and time.  Skin: Skin is warm and dry. No rash noted. No erythema.  Psychiatric: Her behavior is normal. Judgment normal.  On examination of her breast, there are no palpable masses in either breast. Areola and nipples are normal. Her biopsy incision is well-healed.  Data Reviewed I reviewed the mammograms and pathology report. Rectal mammograms and MRI, the calcifications are in the right breast for an area of enhancement of 2.4 cm. There are no other abnormalities. The final pathology showed intermediate grade ER positive ductal carcinoma in situ with some necrosis  Assessment     Patient with ductal carcinoma in situ of the right breast.    Plan    I had a long discussion with the patient and her family regarding the diagnosis. She has been discussed thoroughly in the multidisciplinary breast conference. We are recommending a needle localized lumpectomy and sentinel lymph node biopsy. I discussed this with her in detail. She was also given literature.  I did discuss conservative measures versus mastectomy with her as well. She went to proceed with needle localized lumpectomy and sentinel node biopsy. I discussed the risk of surgery which includes but is not limited to bleeding, infection, injury to surrounding structures including nerves, need for further surgery should margins be positive, etc. She understands and wishes to proceed. Likelihood of success is good.       Rhandi Despain A 06/16/2011, 9:39 AM

## 2011-06-16 NOTE — Progress Notes (Signed)
Jane Wilkerson [id number] April 01, 1941 70 y.o. 06/16/2011 11:35 AM  CC  Dr. Carman Ching  Dr. Glorious Peach., MD [address] [place] [address]  REASON FOR CONSULTATION:  70 year old female with new diagnosis of ductal carcinoma in situ of the right breast status post right needle core biopsy on 06/08/2011. Tumor was estrogen receptor +100% progesterone receptor -0% Patient was seen in the Multidisciplinary Breast Clinic for discussion of her treatment options. She was seen by Dr. Drue Second, Radiation Oncologist [facility]  STAGE: [id number]  REFERRING PHYSICIAN: Dr. [name] Miyamoto  HISTORY OF PRESENT ILLNESS:  Jane Wilkerson is a 70 y.o. female.  With medical history significant for hypothyroidism hypertension anxiety and hyperlipidemia. Patient began having screening mammograms in her 1s. She most recently presented for a screening mammogram at the breast Center on 05/04/2011. The mammogram showed calcifications in the right breast indicating additional evaluation. She went on to have a unilateral right diagnostic mammogram on 06/02/2011. There was noted to be suspicious calcifications in the right upper inner quadrant posteriorly. On 06/08/2011 patient underwent a stereotactic right breast biopsy with the pathology revealing a grade intermediate grade ductal carcinoma in situ with calcifications of the lower inner quadrant of the right breast. The tumor was ER +100% PR +0% on 06/15/2011 patient had MRI of the breasts performed bilaterally. The MRI showed biopsy-proven DCIS in the right breast at the 3:00 location there was a biopsy cavity with adjacent clumped looked nodular enhancement measuring 2.4 x 2.0 x 1.2 cm. Associated clip artifact was noted. No lymphadenopathy was identified. Patient is now seen in the multidisciplinary breast clinic for discussion of treatment options. She is without any  complaints.   Past Medical History:  #1 hypertension  #2 hypothyroidism  #3 anxiety  #4 hyperlipidemia No past medical history on file.  Past Surgical History: Past Surgical History  Procedure Date  . Abdominal hysterectomy     Family History: Family History  Problem Relation Age of Onset  . Cancer Mother     colon  . Cancer Father     pancreatic    Social History History  Substance Use Topics  . Smoking status: Never Smoker   . Smokeless tobacco: Not on file  . Alcohol Use: No    Allergies: Allergies  Allergen Reactions  . Penicillins Rash    Current Medications:  Current Outpatient Prescriptions  Medication Sig Dispense Refill  . albuterol (PROVENTIL HFA;VENTOLIN HFA) 108 (90 BASE) MCG/ACT inhaler Inhale 2 puffs into the lungs every 6 (six) hours as needed.        Marland Kitchen aspirin 81 MG tablet Take 81 mg by mouth daily.        . Calcium Carbonate-Vit D-Min (CALCIUM 1200 PO) Take by mouth.        . esomeprazole (NEXIUM) 40 MG capsule Take 40 mg by mouth daily before breakfast.        . estradiol (VIVELLE-DOT) 0.05 MG/24HR Place 1 patch onto the skin once a week.        . fish oil-omega-3 fatty acids 1000 MG capsule Take 2 g by mouth daily.        . hydrochlorothiazide (HYDRODIURIL) 25 MG tablet Take 25 mg by mouth daily.        . hydrocortisone cream 0.5 % Apply topically 2 (two) times daily.        Marland Kitchen levothyroxine (SYNTHROID, LEVOTHROID) 50 MCG tablet Take 50 mcg by mouth daily.        Marland Kitchen  LORazepam (ATIVAN) 1 MG tablet Take 1 mg by mouth every 4 (four) hours.        Marland Kitchen losartan (COZAAR) 100 MG tablet Take 100 mg by mouth daily.        . nebivolol (BYSTOLIC) 10 MG tablet Take 10 mg by mouth daily.        . simvastatin (ZOCOR) 40 MG tablet Take 40 mg by mouth at bedtime.          OB/GYN History: Menarche at age 69 she did go on hormone replacement therapy for 10 years today she has discontinued the hormone replacement therapy. Patient has 3 children Jane Wilkerson and  Jane Wilkerson she is G3 P3 00 first live birth was at the age of 25.  Fertility Discussion: Patient is postmenopausal Prior History of Cancer: She has no prior history of cancers  Health Maintenance:  Colonoscopy last colonoscopy was in 2011 Bone Density last bone density done October 2012 Last PAP smear last Pap smear was 15 years ago  Mammogram started in the 21s.  ECOG PERFORMANCE STATUS: 0 - Asymptomatic  Genetic Counseling/testing: Patient's family history is only significant for colon cancer in her mom at the age of 81 and bad with pancreatic cancer at the age of 84. Her father died at the age of 73 mother died at the age of 48. Patient has one brother who was alive and well one sister who is alive and well. There is no indication for genetic counseling or testing.  REVIEW OF SYSTEMS:  Constitutional: negative Eyes: negative Ears, nose, mouth, throat, and face: negative Respiratory: negative Cardiovascular: negative Gastrointestinal: negative Genitourinary:negative Integument/breast: positive for breast lump and breast tenderness Hematologic/lymphatic: negative Musculoskeletal:positive for arthralgias and stiff joints Neurological: negative Behavioral/Psych: negative Endocrine: negative Allergic/Immunologic: negative  PHYSICAL EXAMINATION: Blood pressure 150/84, pulse 69, temperature 97.9 F (36.6 C), temperature source Oral, height 5' 0.5" (1.537 m), weight 152 lb 11.2 oz (69.264 kg).  ZOX:WRUEA, healthy, no distress, well nourished, well developed and smiling SKIN: skin color, texture, turgor are normal, no rashes or significant lesions HEAD: Normocephalic, No masses, lesions, tenderness or abnormalities EYES: normal, PERRLA, EOMI, Conjunctiva are pink and non-injected, sclera clear EARS: External ears normal, Canals clear OROPHARYNX:no exudate, no erythema, lips, buccal mucosa, and tongue normal and dentition normal  NECK: supple, no adenopathy, no bruits, no JVD,  thyroid normal size, non-tender, without nodularity LYMPH:  no palpable lymphadenopathy, no hepatosplenomegaly BREAST:right breast normal without mass, skin or nipple changes or axillary nodes, left breast normal without mass, skin or nipple changes or axillary nodes, right breast does reveal site of recent biopsy otherwise no skin findings LUNGS: clear to auscultation , and palpation, clear to auscultation and percussion HEART: regular rate & rhythm, no murmurs, no gallops, S1 normal and S2 normal ABDOMEN:abdomen soft, non-tender, obese, normal bowel sounds and no masses or organomegaly BACK: Back symmetric, no curvature., No CVA tenderness, Range of motion is normal EXTREMITIES:less then 2 second capillary refill, no joint deformities, effusion, or inflammation, no edema, no skin discoloration, no clubbing, no cyanosis  NEURO: alert & oriented x 3 with fluent speech, no focal motor/sensory deficits, gait normal, reflexes normal and symmetric     STUDIES/RESULTS: Mr Breast Bilateral W Wo Contrast  06/15/2011  *RADIOLOGY REPORT*  Clinical Data: Newly-diagnosed right breast DCIS manifesting as mammographically detected calcifications  BUN and creatinine were obtained on site at [facility] at [address] Results:  BUN 13 mg/dL,  Creatinine 1.3 mg/dL.  BILATERAL BREAST MRI  WITH AND WITHOUT CONTRAST  Technique: Multiplanar, multisequence MR images of both breasts were obtained prior to and following the intravenous administration of 7ml of Multihance.  Three dimensional images were evaluated at the independent DynaCad workstation.  Comparison:  Prior mammograms  Findings: At the site of biopsy-proven DCIS in the right breast three o'clock location, there is a biopsy cavity with adjacent clumped nodular enhancement measuring overall 2.4 x 2.0 x 1.2 cm. Associated clip artifact is noted. This area corresponds approximately to the area of mammographically detectable calcifications.   Separate post biopsy change is noted along the expected location of the biopsy tract but this is not included in overall measurements.  Associated plateau type enhancement kinetics are noted.  No other area of abnormal enhancement is seen on either side.  No lymphadenopathy or abnormal T2-weighted hyperintensity is identified.  IMPRESSION: Solitary area of abnormal enhancement associated with post biopsy change in the right breast three o'clock location corresponding to biopsy-proven DCIS.  No MRI evidence for contralateral or multifocal/multicentric disease.  THREE-DIMENSIONAL MR IMAGE RENDERING ON INDEPENDENT WORKSTATION:  Three-dimensional MR images were rendered by post-processing of the original MR data on an independent workstation.  The three- dimensional MR images were interpreted, and findings were reported in the accompanying complete MRI report for this study.  BI-RADS CATEGORY 6:  Known biopsy-proven malignancy - appropriate action should be taken.  Recommendation:  Treatment plan  Original Report Authenticated By: Harrel Lemon, M.D.   Mm Breast Stereo Biopsy Right  06/09/2011  **ADDENDUM** CREATED: 06/09/2011 12:07:24  The pathology demonstrated DCIS.  This is concordant with imaging findings.  Dr. Jean Rosenthal discussed the findings with the patient by telephone and answered her questions.  The patient will be scheduled for the breast cancer multidisciplinary clinic on 06/16/2011.  Breast MRI is planned for 06/15/2011. The patient states that her biopsy site is clean and dry without hematoma formation.  Post biopsy wound care instructions were reviewed with the patient.  The patient was encouraged the Breast Center for additional questions or concerns.  Addended by:  Rolla Plate, M.D. on 06/09/2011 11:91:47.  **END ADDENDUM** SIGNED BY: Rolla Plate, M.D.   06/09/2011  *RADIOLOGY REPORT*  Clinical Data:  Right breast calcifications.  STEREOTACTIC-GUIDED VACUUM ASSISTED BIOPSY OF THE RIGHT  BREAST AND SPECIMEN RADIOGRAPH  I met with the patient, and we discussed the procedure of ultrasound-guided biopsy, including benefits and alternatives.  We discussed the high likelihood of a successful procedure. We discussed the risks of the procedure, including infection, bleeding, tissue injury, clip migration, and inadequate sampling.  Informed, written consent was given. Using sterile technique, 2% lidocaine, stereotactic guidance, and a 9 gauge vacuum assisted device, biopsy was performed of the calcifications located within the lower inner quadrant of the right breast.  Specimen radiograph was performed, showing representative calcifications within the specimen.  Specimens with calcifications are identified for pathology.  At the conclusion of the procedure, a T-shaped tissue marker clip was deployed into the biopsy cavity.  Follow-up 2-view mammogram confirmed clip to be in appropriate position.  IMPRESSION: Stereotactic-guided biopsy of right breast calcifications as discussed above.  No apparent complications.  Original Report Authenticated By: Rolla Plate, M.D.   Mm Breast Surgical Specimen  06/09/2011  **ADDENDUM** CREATED: 06/09/2011 12:07:24  The pathology demonstrated DCIS.  This is concordant with imaging findings.  Dr. Jean Rosenthal discussed the findings with the patient by telephone and answered her questions.  The patient will be scheduled for the breast cancer multidisciplinary clinic  on 06/16/2011.  Breast MRI is planned for 06/15/2011. The patient states that her biopsy site is clean and dry without hematoma formation.  Post biopsy wound care instructions were reviewed with the patient.  The patient was encouraged the Breast Center for additional questions or concerns.  Addended by:  Rolla Plate, M.D. on 06/09/2011 11:91:47.  **END ADDENDUM** SIGNED BY: Rolla Plate, M.D.   06/09/2011  *RADIOLOGY REPORT*  Clinical Data:  Right breast calcifications.  STEREOTACTIC-GUIDED VACUUM  ASSISTED BIOPSY OF THE RIGHT BREAST AND SPECIMEN RADIOGRAPH  I met with the patient, and we discussed the procedure of ultrasound-guided biopsy, including benefits and alternatives.  We discussed the high likelihood of a successful procedure. We discussed the risks of the procedure, including infection, bleeding, tissue injury, clip migration, and inadequate sampling.  Informed, written consent was given. Using sterile technique, 2% lidocaine, stereotactic guidance, and a 9 gauge vacuum assisted device, biopsy was performed of the calcifications located within the lower inner quadrant of the right breast.  Specimen radiograph was performed, showing representative calcifications within the specimen.  Specimens with calcifications are identified for pathology.  At the conclusion of the procedure, a T-shaped tissue marker clip was deployed into the biopsy cavity.  Follow-up 2-view mammogram confirmed clip to be in appropriate position.  IMPRESSION: Stereotactic-guided biopsy of right breast calcifications as discussed above.  No apparent complications.  Original Report Authenticated By: Rolla Plate, M.D.   Mm Digital Diagnostic Unilat R  06/02/2011  *RADIOLOGY REPORT*  Clinical Data:  The patient returns for evaluation of calcifications in the right breast noted on recent screening study dated 05/04/2011.  DIGITAL DIAGNOSTIC RIGHT MAMMOGRAM WITH CAD  Comparison:  12/30/2008  Findings:  Magnification views demonstrate calcifications in the right upper inner quadrant posteriorly that vary in size and shape. Linear forms are present.  Appearance is suspicious and biopsy is suggested.  Stereotactic core needle biopsy was discussed with the patient and she agreed with this plan.  IMPRESSION: Suspicious calcifications in the right upper inner quadrant posteriorly.  Stereotactic core needle biopsy is suggested.  This will be scheduled at a convenient time for the patient at the [facility].   BI-RADS CATEGORY 4:  Suspicious abnormality - biopsy should be considered.  Original Report Authenticated By: Daryl Eastern, M.D.     LABS:    Chemistry      Component Value Date/Time   NA 136 06/16/2011 0828   K 3.5 06/16/2011 0828   CL 97 06/16/2011 0828   CO2 32 06/16/2011 0828   BUN 10 06/16/2011 0828   CREATININE 1.14* 06/16/2011 0828      Component Value Date/Time   CALCIUM 9.9 06/16/2011 0828   ALKPHOS 57 06/16/2011 0828   AST 18 06/16/2011 0828   ALT 14 06/16/2011 0828   BILITOT 0.5 06/16/2011 0828      Lab Results  Component Value Date   WBC 6.1 06/16/2011   HGB 12.6 06/16/2011   HCT 37.2 06/16/2011   MCV 89.4 06/16/2011   PLT 299 06/16/2011       ASSESSMENT    71 year old female with new diagnosis of ductal carcinoma in situ with calcifications of the right breast. The area of concern on MRI measuring 2.4 cm in the right breast. The pathology does show a ductal carcinoma in situ with calcifications intermediate grade. The estrogen receptor on the DCIS is 100% positive progesterone receptor -0%. Patient is now seen in the multidisciplinary breast clinic for consideration of treatment  options. She was seen by Dr. Carman Ching and Dr. Lurline Hare.  Recommendations are made today by all of [place]. It is recommended that patient undergo a lumpectomy with sentinel node biopsy by Dr. Magnus Ivan. She will then go on to receive radiation therapy to the residual breast. Patient and I discussed adjuvant treatment consisting of an aromatase inhibitor or tamoxifen. Information on these 2 drugs are given to the patient.  Clinical Trial Eligibility: We also discussed clinical trial and her eligibility. Patient has DCIS and she certainly would be a candidate for the NSABP B. 43 clinical trial I discussed this with her extensively and she has agreed to participate. 1 she returns to followup with me I will have our research nurses see the patient.  Multidisciplinary  conference discussion patient's case was discussed at the month at the multidisciplinary breast clinic and conference today. The above recommendations were made as a group    PLAN:    We will proceed with her lumpectomy with sentinel node biopsy. Dr. Eliberto Ivory office will call the patient for appointment scheduling. Once her final pathology is released then we will discuss her case again at our multidisciplinary breast conference. She is also set up to see me back in about 3 weeks time to discuss further treatment options including the NSABP B- 43 clinical trial as well as ongoing adjuvant treatment and post lumpectomy and radiation.       Discussion: Patient is being treated per NCCN breast cancer care guidelines appropriate for stage. Clinical stage 0 (DCIS)   Thank you so much for allowing me to participate in the care of Jane Wilkerson. I will continue to follow up the patient with you and assist in her care.  All questions were answered. The patient knows to call the clinic with any problems, questions or concerns. We can certainly see the patient much sooner if necessary.  I spent 40 minutes counseling the patient face to face. The total time spent in the appointment was 60 minutes.  Drue Second, MD MedicalOncology [facility] [phone] (beeper) [phone] (Office)  06/16/2011, 11:35 AM 06/16/2011, 11:35 AM

## 2011-06-16 NOTE — Telephone Encounter (Signed)
gv pt appt schedule for jan. Per 1212 elec pof f/u 3wks.

## 2011-06-16 NOTE — Progress Notes (Signed)
Mailed after appt letter to pt. 

## 2011-06-16 NOTE — Patient Instructions (Addendum)
Breast Cancer, Early Stage, Surgery Choices Breast cancer is the most common cancer, except for skin cancer. It is the second most common cause of death, except for lung cancer, in women. The risk of a woman developing breast cancer is 12.5%. Breast cancer in women younger than 70 years old is rare. Most of these cancer cases have spread to the breast from another part of the body (metastatic). Finding out that you have breast cancer is an emotional shock and is difficult to understand, because it is an overwhelming, serious, and life-threatening disease. You will need to make important decisions about how you want it treated.  As a woman with early-stage breast cancer (DCIS or Stage I, IIA, IIB, IIIA, or IV) you may be able to choose which type of breast surgery to have. Your caregiver will help you understand what stage you are in. Often, your choices are:  Removal of the cancerous part(s) of the breast (breast-sparing surgery).   Lumpectomy.   Partial/Segmental mastectomy.   Removal of the whole breast (simple mastectomy). Research shows that women with early-stage breast cancer who have breast-sparing surgery along with radiation therapy live as long as those who have a mastectomy. Most women with breast cancer will lead long, healthy lives after treatment.   Reconstructive surgery. This type of surgery is to make the breast and nipple area as normal looking as it was before the mastectomy. It is usually done by a plastic surgeon at the same time as the mastectomy. There are several types of reconstructive surgery that should be discussed with your caregiver and plastic surgeon.   Lymph node surgery.   Sentinel (removing those lymph nodes in the armpit that breast cancer spreads to first).   Axillary lymph node dissection (removing all the lymph nodes in the armpit).  TREATMENT Treatment for breast cancer usually begins a few weeks after diagnosis. In these weeks, you should meet with a  surgeon, learn the facts about your surgery choices, treatment options, get a second opinion, and think about what is important to you.  Treatment choices include:  Surgery.   Radiation.   Chemotherapy.   Medicines, hormones.   Reconstructive breast surgery.   Any combination of the above treatments.  Choose which kind of surgery to have. If radiation, chemotherapy, or hormone therapy is considered, this also should be discussed before the surgery. Radical breast surgery is rarely performed now. Usually, lumpectomy, partial/segmental mastectomy, simple mastectomy in combination with radiation, chemotherapy, and/or hormone treatment is recommended. Clinical trial protocols (specific treatment plan with surgery and radiation, chemotherapy, and hormones) for breast cancer have been put in place in hospitals, universities, medical schools, and cancer centers all over the country. These patients are followed for several years to find out what treatment gives the best results for the protocol given, for the different stages of breast cancer.  Most women want to make this choice with help from their caregiver. After all, the kind of surgery you have will affect how you look and feel. But it is often hard to decide what to do. The more information you have, the better the decision you can make.  Talk to a surgeon about your breast cancer surgery choices. Find out what happens during surgery, types of problems that sometimes occur, and other kinds of treatment (if any) you will need after surgery. Be sure to ask a lot of questions and learn as much as you can. You may also wish to talk with family members, friends, or  others who have had breast cancer surgery, radiation therapy, chemotherapy, or hormone therapy.   After talking with a surgeon, you may want a second opinion. This means talking with another doctor or surgeon who might tell you about other treatment options. They may simply give you  information that can help you feel better about the choice you are making. Do not worry about hurting your surgeon's feelings. It is common practice to get a second opinion, and some insurance companies require it. Plus, it is better to get a second opinion than to worry that you have made the wrong choice.  STAGES OF BREAST CANCER Staging of breast cancer depends on the size of the tumor, if and how far it has spread, lymph node involvement in the armpits, and whether it has spread to other parts of the body. If you are unsure of the stage of your cancer, ask your caregiver. The following are the stages of breast cancer:  Stage 0: This means you either have DCIS or LCIS.   DCIS (Ductal Carcinoma in Situ) is very early breast cancer. It is often too small to form a lump. Your caregiver may refer to DCIS as noninvasive cancer.   LCIS (Lobular Carcinoma in Situ) is not cancer, but it may increase the [place] that you will get breast cancer. Talk with your caregiver about treatment options, if you are diagnosed with LCIS.   The 5 year survival rate in this stage is almost 100%.   Stage I:   Your cancer is less than 1 inch across (2 centimeters) or about the size of a quarter. The cancer is only in the breast. It has not spread to lymph nodes or other parts of your body.   Stage IIA:   No cancer is found in your breast. However, cancer is found in the lymph nodes under your arm, or   Your cancer is 1 inch (2 centimeters) or smaller. It has spread to 3 lymph nodes or less (the lymph nodes under your arm), or   Your cancer is about 1-2 inches (2-5 centimeters). It has not spread to the lymph nodes under your arm.   Stage IIB:   Your cancer is about 1-2 inches (2-5 centimeters). It has spread to the lymph nodes under your arm, or   Your cancer is larger than 2 inches (5 centimeters). It has not spread to the lymph nodes under your arm.   The 5 year survival rate is 85 to 90%.   Stage IIIA:     No cancer is found in the breast. It is found in lymph nodes under your arm. The lymph nodes are attached to each other, or   Your cancer is 2 inches (5 centimeters) or smaller. It has spread to lymph nodes under your arm. The lymph nodes are attached to each other, or   Your cancer is larger than 2 inches (5 centimeters) and has spread to lymph nodes under your arm, and they are not attached to each other.   Your cancer is smaller than 2 inches (5 centimeters) and has spread to lymph nodes above your collarbone.   Inflammatory cancer of the breast (red rash, tender and swollen breasts) is in the stage III category.   The 5 year survival rate is 45 to 67%.   Stage IV:   Cancer cells have spread to other parts of the body.   The 5 year survival rate is about 20%.  ABOUT LYMPH NODES  Lymph nodes  are part of your body's immune system, which helps fight infection and disease. Lymph nodes are small, round, and clustered (like a bunch of grapes) throughout your body.   Axillary lymph nodes are in the area under your arm. Breast cancer may spread to these lymph nodes first, even when the tumor in the breast is small. This is why most surgeons take out some of these lymph nodes at the time of breast surgery.   Lymphedema is swelling caused by a buildup of lymph fluid. You may have this type of swelling in your arm, if your lymph nodes are taken out with surgery or damaged by radiation therapy.   Lymphedema can show up soon after surgery. The symptoms are often mild and last for a short time.   Lymphedema can show up months or even years after cancer treatment is over. Often, lymphedema develops after an insect bite, minor injury, or burn on the arm where your lymph nodes were removed. Sometimes, this can be painful. One way to reduce the swelling is to work with a caregiver who specializes in rehabilitation, or with a physical therapist.   Sentinel lymph node biopsy is surgery to remove as  few lymph nodes as possible from under the arm. The surgeon first injects a dye in the breast to see which lymph nodes the breast tumor drains into. Then, he or she removes these nodes to see if they have any cancer. If there is no cancer, the surgeon may leave the other lymph nodes in place. This surgery is fairly new and is being studied experimentally. Talk with your surgeon if you want to learn more.  COMPARE YOUR CHOICES  BREAST-SPARING SURGERY  Is this surgery right for me?   Breast-sparing surgery with radiation is a safe choice for most women who have early-stage breast cancer. This means that your cancer is DCIS or at Stage I, IIA, IIB, or IIIA.  What are the names of the different kinds of surgery?  Lumpectomy.   Partial mastectomy.   Breast-sparing surgery.   Segmental mastectomy.   Simple mastectomy.   Sentinel lymph node removal.   Axillary lymph node excision.  What doctors am I likely to see?  Oncologist.   Surgeon.   Radiation Oncologist.   Chemotherapy Oncologist.  What will my breast look like after surgery?  Your breast should look a lot like it did before surgery. But if your tumor is large, your breast may look different or smaller after breast-sparing surgery.  Will I have feeling in the area around my breast?  Yes. You should still have feeling in your breast, nipple, and areola (dark area around your nipple).  Will I have pain after the surgery?  You may have pain after surgery. Talk with your caregiver about ways to control this pain.  What other problems can I expect?  You may feel very tired after radiation therapy. You may get swelling in your arm (lymphedema).  Will I need more surgery?  Maybe. You may need more surgery to remove lymph nodes from under your arm. Also, if the surgeon does not remove all your cancer the first time, you may need more surgery.  What other types of treatment will I need?  You may need radiation therapy, given  almost every day for 5 to 8 weeks. You may also need chemotherapy, hormone therapy, or both.  Will insurance pay for my surgery?  Check with your insurance company to find out how much it pays for  breast cancer surgery and other needed treatments.  Will the type of surgery I have affect how long I live?  Women with early-stage breast cancer who have breast-sparing surgery followed by radiation live just as long as women who have a mastectomy. Most women with breast cancer will lead long, healthy lives after treatment.  What are the chances that my cancer will come back after surgery?  About 10% of women who have breast-sparing surgery along with radiation therapy get cancer in the same breast within 12 years. If this happens, you will need a mastectomy, but it will not affect how long you live.  MASTECTOMY SURGERY Is this surgery right for me?   Mastectomy is a safe choice for women who have early-stage breast cancer (DCIS, Stage I, IIA, IIB, or IIIA). You may need a mastectomy if:   You have small breasts and a large tumor.   You have cancer in more than one part of your breast.   The tumor is under the nipple.   You do not have access to radiation therapy.  What are the names of the different kinds of surgery?  Total mastectomy.   Modified radical mastectomy (not usually done now).   Double mastectomy.   Simple mastectomy.  What doctors am I likely to see?  Oncologist.   Surgeon.   Radiation Oncologist.   Chemotherapy Oncologist.  What will my breast look like after surgery?  Your breast and nipple will be removed. You will have a flat chest on the side of your body where the breast was removed.  Will I have feeling in the area around my breast?  Maybe. After surgery, you will have no feeling (numb) in your chest wall and maybe also under your arm. This numb feeling should go away in 1-2 years, but it will never feel like it did before. Also, the skin where your breast  was may feel tight.  Will I have pain after the surgery?  You may have pain after surgery. Talk with your caregiver about ways to control this pain.  What other problems can I expect?  You may have pain in your neck or back. You may feel out of balance, if you had large breasts and do not have reconstruction surgery. You may get swelling in your arm (lymphedema).  Will I need more surgery?  Maybe. You may need surgery to remove lymph nodes from under your arm. Also, if you have problems after your mastectomy, you may need to see your surgeon for treatment.  What other types of treatment will I need?  You may also need chemotherapy, hormone therapy, or radiation therapy. Some women get all 3 types of therapy or any combination of the 3.  Will insurance pay for my surgery?  Check with your insurance company to find out how much it pays for breast cancer surgery and other needed treatments.  Will the type of surgery I have affect how long I live?  Women with early-stage breast cancer who have a mastectomy live as long as women who have breast-sparing surgery followed by radiation therapy. Most women with breast cancer will lead long, healthy lives after treatment.  What are the chances that my cancer will come back after surgery?  About 5% of women who have a mastectomy will get cancer on the same side of their chest within 12 years.  MASTECTOMY AND BREAST RECONSTRUCTION SURGERY  Is this surgery right for me?  If you have a mastectomy, you might  also want breast reconstruction surgery.   You can choose to have reconstruction surgery at the same time as your mastectomy.   You can wait and have it at a later date.  What are the names of the different kinds of surgery?  Breast implant.   Tissue flap surgery.   Reconstructive plastic surgery.  What doctors am I likely to see?  Oncologist.   Surgeon.   Radiation Oncologist.   Reconstructive Plastic Surgeon.   Chemotherapy  Oncologist.  What will my breast look like after surgery?  Although you will have a breast-like shape, your breast will not look the same as it did before surgery.  Will I have feeling in the area around my breast?  No. The area around your breast will always be numb (have no feeling).  Will I have pain after the surgery?  You are likely to have pain after major surgery, such as mastectomy and reconstruction surgery. There are many ways to deal with pain. Let your caregiver know if you need relief from pain.  What other problems can I expect?  It may take you many weeks or even months to recover from breast reconstruction surgery. If you have an implant, you may get infections, pain, or hardness. Also, you may not like how your breast-like shape looks. You may need more surgery if your implant breaks or leaks. If you have tissue flap surgery, you may lose strength in the part of your body where the flap came from. You may get swelling in your arm (lymphedema).  Will I need more surgery?  Yes. You will need surgery at least 2 more times to build a new breast-like shape. With implants, you may need more surgery months or years later. You may also need surgery to remove lymph nodes from under your arm.  What other types of treatment will I need?  You may need chemotherapy, hormone therapy, or radiation therapy. Some women get all 3 types of therapy or any combination of the 3.  Will insurance pay for my surgery?  Check with your insurance company to find out if it pays for breast reconstruction surgery. You should also ask if your insurance will pay for problems that may result from breast reconstruction surgery.  Will the type of surgery I have affect how long I live?  Women with early-stage breast cancer who have a mastectomy live the same amount of time as women who have breast-sparing surgery followed by radiation therapy. Most women with breast cancer will lead long, healthy lives after  treatment.  What are the chances that my cancer will come back after surgery?  About 5% of women who have a mastectomy will get cancer on the same side of their chest within 12 years. Breast reconstruction surgery does not affect the chances of your cancer coming back.  Where can I learn more about coping with life after cancer? To learn more about life after cancer, you might want to read "Facing Forward: Life After Cancer Treatment." You can get this booklet at ReviewTheMovie.co.za or by calling 1-800-4-CANCER. THINK ABOUT WHAT IS IMPORTANT TO YOU   After you have talked with your surgeon and learned the facts, you may also want to talk with your spouse or partner, family, friends, or other women who have had breast cancer surgery. The more you know about breast cancer, the treatment, and what happens afterward, the more informed you will be and the easier it will be for you to make good decisions  that are important to you.   Think about what is important to you. Here are some questions to think about:   Do I want to get a second opinion?   How important is it to me how my breast looks after cancer surgery?   How important is it to me how my breast feels after cancer surgery?   If I have breast-sparing surgery or more extensive surgery, am I willing to get radiation, hormone and/or chemotherapy?   If I have a mastectomy, do I also want breast reconstruction surgery?   If I have breast reconstruction surgery, do I want it at the same time as my mastectomy?   What treatment does my insurance cover, and what do I need to pay for?   Who would I like to talk with about my surgery, radiation, hormone, and chemotherapy choices?   What else do I want to know, do, or learn before I make my choice about breast cancer surgery?   After I have learned all I could and have talked with my surgeon, I will make a choice that feels right for me.   A patient who takes the time to be  well-informed will feel more comfortable about her decisions regarding surgery, and will feel better about the procedure following the surgery.   Coping and Support:   Be open and willing to talk to your family and friends about your cancer. Family and friends can be your best support group, to help you work through your concerns and worries.   Discussing your thoughts, concerns, and intimacy issues with your spouse or partner is very important and helpful.   Join support groups to share and learn how others with breast cancer cope and deal with their cancer. The [employer] Society can help you find support groups in your area.   Breast cancer may affect your confidence and feelings about being a total woman. It may interfere with your intimate relationship with your partner. Counseling may be necessary to help you overcome these feelings.   Talk to a counselor, your clergyperson, psychologist, or psychiatrist.   Talk to a medical social worker, if you have financial questions or problems.   Do not be afraid to ask for help, especially during your treatment.   Learn to be as independent as possible, as soon as possible.  Document Released: 09/11/2003 Document Revised: 03/03/2011 Document Reviewed: 05/19/2009 [facility] Patient Information 2012 [place], [place].  Tamoxifen oral tablet What is this medicine? TAMOXIFEN (ta MOX i fen) blocks the effects of estrogen. It is commonly used to treat breast cancer. It is also used to decrease the [place] of breast cancer coming back in women who have received treatment for the disease. It may also help prevent breast cancer in women who have a high risk of developing breast cancer. This medicine may be used for other purposes; ask your health care provider or pharmacist if you have questions. What should I tell my health care provider before I take this medicine? They need to know if you have any of these conditions: -blood clots -blood  disease -cataracts or impaired eyesight -endometriosis -high calcium levels -high cholesterol -irregular menstrual cycles -liver disease -stroke -uterine fibroids -an unusual or allergic reaction to tamoxifen, other medicines, foods, dyes, or preservatives -pregnant or trying to get pregnant -breast-feeding How should I use this medicine? Take this medicine by mouth with a glass of water. Follow the directions on the prescription label. You can take it with or  without food. Take your medicine at regular intervals. Do not take your medicine more often than directed. Do not stop taking except on your doctor's advice. A special MedGuide will be given to you by the pharmacist with each prescription and refill. Be sure to read this information carefully each time. Talk to your pediatrician regarding the use of this medicine in children. While this drug may be prescribed for selected conditions, precautions do apply. Overdosage: If you think you have taken too much of this medicine contact a poison control center or emergency room at once. NOTE: This medicine is only for you. Do not share this medicine with others. What if I miss a dose? If you miss a dose, take it as soon as you can. If it is almost time for your next dose, take only that dose. Do not take double or extra doses. What may interact with this medicine? -aminoglutethimide -bromocriptine -chemotherapy drugs -female hormones, like estrogens and birth control pills -letrozole -medroxyprogesterone -phenobarbital -rifampin -warfarin This list may not describe all possible interactions. Give your health care provider a list of all the medicines, herbs, non-prescription drugs, or dietary supplements you use. Also tell them if you smoke, drink alcohol, or use illegal drugs. Some items may interact with your medicine. What should I watch for while using this medicine? Visit your doctor or health care professional for regular checks on  your progress. You will need regular pelvic exams, breast exams, and mammograms. If you are taking this medicine to reduce your risk of getting breast cancer, you should know that this medicine does not prevent all types of breast cancer. If breast cancer or other problems occur, there is no guarantee that it will be found at an early stage. Do not become pregnant while taking this medicine or for 2 months after stopping this medicine. Stop taking this medicine if you get pregnant or think you are pregnant and contact your doctor. This medicine may harm your unborn baby. Women who can possibly become pregnant should use birth control methods that do not use hormones during tamoxifen treatment and for 2 months after therapy has stopped. Talk with your health care provider for birth control advice. Do not breast feed while taking this medicine. What side effects may I notice from receiving this medicine? Side effects that you should report to your doctor or health care professional as soon as possible: -changes in vision (blurred vision) -changes in your menstrual cycle -difficulty breathing or shortness of breath -difficulty walking or talking -new breast lumps -numbness -pelvic pain or pressure -redness, blistering, peeling or loosening of the skin, including inside the mouth -skin rash or itching (hives) -sudden chest pain -swelling of lips, face, or tongue -swelling, pain or tenderness in your calf or leg -unusual bruising or bleeding -vaginal discharge that is bloody, brown, or rust -weakness -yellowing of the whites of the eyes or skin Side effects that usually do not require medical attention (report to your doctor or health care professional if they continue or are bothersome): -fatigue -hair loss, although uncommon and is usually mild -headache -hot flashes -impotence (in men) -nausea, vomiting (mild) -vaginal discharge (white or clear) This list may not describe all possible side  effects. Call your doctor for medical advice about side effects. You may report side effects to FDA at 1-800-FDA-1088. Where should I keep my medicine? Keep out of the reach of children. Store at room temperature between 20 and 25 degrees C (68 and 77 degrees F). Protect from  light. Keep container tightly closed. Throw away any unused medicine after the expiration date. NOTE: This sheet is a summary. It may not cover all possible information. If you have questions about this medicine, talk to your doctor, pharmacist, or health care provider.  2012, [employer]. (03/07/2008 12:01:56 PM)  Exemestane tablets What is this medicine? EXEMESTANE (ex e MES tane) blocks the production of the hormone estrogen. Some types of breast cancer depend on estrogen to grow, and this medicine can stop tumor growth by blocking estrogen production. This medicine is for the treatment of breast cancer in postmenopausal women only. This medicine may be used for other purposes; ask your health care provider or pharmacist if you have questions. What should I tell my health care provider before I take this medicine? They need to know if you have any of these conditions: -an unusual or allergic reaction to exemestane, other medicines, foods, dyes, or preservatives -pregnant or trying to get pregnant -breast-feeding How should I use this medicine? Take this medicine by mouth with a glass of water. Follow the directions on the prescription label. Take your doses at regular intervals after a meal. Do not take your medicine more often than directed. Do not stop taking except on the advice of your doctor or health care professional. Contact your pediatrician regarding the use of this medicine in children. Special care may be needed. Overdosage: If you think you have taken too much of this medicine contact a poison control center or emergency room at once. NOTE: This medicine is only for you. Do not share this medicine  with others. What if I miss a dose? If you miss a dose, take the next dose as usual. Do not try to make up the missed dose. Do not take double or extra doses. What may interact with this medicine? Do not take this medicine with any of the following medications: -female hormones, like estrogens and birth control pills This medicine may also interact with the following medications: -androstenedione -phenytoin -rifabutin, rifampin, or rifapentine -St. John's Wort This list may not describe all possible interactions. Give your health care provider a list of all the medicines, herbs, non-prescription drugs, or dietary supplements you use. Also tell them if you smoke, drink alcohol, or use illegal drugs. Some items may interact with your medicine. What should I watch for while using this medicine? Visit your doctor or health care professional for regular checks on your progress. If you experience hot flashes or sweating while taking this medicine, avoid alcohol, smoking and drinks with caffeine. This may help to decrease these side effects. What side effects may I notice from receiving this medicine? Side effects that you should report to your doctor or health care professional as soon as possible: -any new or unusual symptoms -changes in vision -fever -leg or arm swelling -pain in bones, joints, or muscles -pain in hips, back, ribs, arms, shoulders, or legs Side effects that usually do not require medical attention (report to your doctor or health care professional if they continue or are bothersome): -difficulty sleeping -headache -hot flashes -sweating -unusually weak or tired This list may not describe all possible side effects. Call your doctor for medical advice about side effects. You may report side effects to FDA at 1-800-FDA-1088. Where should I keep my medicine? Keep out of the reach of children. Store at room temperature between 15 and 30 degrees C (59 and 86 degrees F). Throw  away any unused medicine after the expiration date. NOTE: This  sheet is a summary. It may not cover all possible information. If you have questions about this medicine, talk to your doctor, pharmacist, or health care provider.  2012, Elsevier/Gold Standard. (10/24/2007 11:48:29 AM)  Lab Results  Component Value Date   WBC 6.1 06/16/2011   HGB 12.6 06/16/2011   HCT 37.2 06/16/2011   MCV 89.4 06/16/2011   PLT 299 06/16/2011

## 2011-06-17 ENCOUNTER — Encounter: Payer: Self-pay | Admitting: *Deleted

## 2011-06-17 ENCOUNTER — Other Ambulatory Visit (INDEPENDENT_AMBULATORY_CARE_PROVIDER_SITE_OTHER): Payer: Self-pay | Admitting: Surgery

## 2011-06-17 DIAGNOSIS — D0510 Intraductal carcinoma in situ of unspecified breast: Secondary | ICD-10-CM

## 2011-06-17 NOTE — Progress Notes (Signed)
CSW met with pt, pt's daughter,and pt's husband in [facility].  CSW informed pt on the support services available at [facility], and provided pt with a Patient and Family support calendar.  Pt was thankful for the information, and reported feelings of increased stress since her diagnosis.  CSW validated pt's feelings and encouraged her to take advantage of the resources available.  Pt stated she would consider participating in support programs after her surgery.  Pt stated "i just needs some time alone, and away from everyone." CSW applauded pt's honesty and willingness to share.  CSW offerred pt additonal support and encouraged her to call CSW with any needs or concerns.  Tamala Julian, LCSW

## 2011-06-21 ENCOUNTER — Encounter (HOSPITAL_COMMUNITY): Payer: Self-pay | Admitting: Pharmacy Technician

## 2011-06-22 ENCOUNTER — Telehealth: Payer: Self-pay | Admitting: *Deleted

## 2011-06-22 NOTE — Telephone Encounter (Signed)
Levt VM for pt to return call concerning [facility] from 06/16/11.

## 2011-06-24 ENCOUNTER — Encounter (HOSPITAL_COMMUNITY): Payer: Self-pay

## 2011-06-24 ENCOUNTER — Encounter (HOSPITAL_COMMUNITY)
Admission: RE | Admit: 2011-06-24 | Discharge: 2011-06-24 | Disposition: A | Discharge status: Home / Self Care | Payer: Medicare Other | Source: Ambulatory Visit | Attending: Surgery | Admitting: Surgery

## 2011-06-24 ENCOUNTER — Encounter (HOSPITAL_COMMUNITY)
Admission: RE | Admit: 2011-06-24 | Discharge: 2011-06-24 | Disposition: A | Discharge status: Home / Self Care | Payer: Medicare Other | Source: Ambulatory Visit | Attending: Anesthesiology | Admitting: Anesthesiology

## 2011-06-24 DIAGNOSIS — D0510 Intraductal carcinoma in situ of unspecified breast: Secondary | ICD-10-CM

## 2011-06-24 HISTORY — DX: Nausea with vomiting, unspecified: R11.2

## 2011-06-24 HISTORY — DX: Unspecified osteoarthritis, unspecified site: M19.90

## 2011-06-24 HISTORY — DX: Personal history of colonic polyps: Z86.010

## 2011-06-24 HISTORY — DX: Personal history of other infectious and parasitic diseases: Z86.19

## 2011-06-24 HISTORY — DX: Frequency of micturition: R35.0

## 2011-06-24 HISTORY — DX: Other chronic pain: M54.9

## 2011-06-24 HISTORY — DX: Nocturia: R35.1

## 2011-06-24 HISTORY — DX: Personal history of colon polyps, unspecified: Z86.0100

## 2011-06-24 HISTORY — DX: Hypothyroidism, unspecified: E03.9

## 2011-06-24 HISTORY — DX: Dizziness and giddiness: R42

## 2011-06-24 HISTORY — DX: Other chronic pain: G89.29

## 2011-06-24 HISTORY — DX: Gastro-esophageal reflux disease without esophagitis: K21.9

## 2011-06-24 HISTORY — DX: Essential (primary) hypertension: I10

## 2011-06-24 HISTORY — DX: Other specified postprocedural states: Z98.890

## 2011-06-24 HISTORY — DX: Hyperlipidemia, unspecified: E78.5

## 2011-06-24 HISTORY — DX: Anxiety disorder, unspecified: F41.9

## 2011-06-24 HISTORY — DX: Shortness of breath: R06.02

## 2011-06-24 HISTORY — DX: Diverticulosis of intestine, part unspecified, without perforation or abscess without bleeding: K57.90

## 2011-06-24 LAB — BASIC METABOLIC PANEL
GFR calc Af Amer: 54 mL/min — ABNORMAL LOW (ref >90)
GFR calc non Af Amer: 47 mL/min — ABNORMAL LOW (ref >90)
Potassium: 4.2 mEq/L (ref 3.5–5.1)
Sodium: 138 mEq/L (ref 135–145)

## 2011-06-24 LAB — CBC
Hemoglobin: 12.7 g/dL (ref 12.0–15.0)
RBC: 4.29 MIL/uL (ref 3.87–5.11)
WBC: 7.6 10*3/uL (ref 4.0–10.5)

## 2011-06-24 LAB — SURGICAL PCR SCREEN
MRSA, PCR: NEGATIVE
Staphylococcus aureus: NEGATIVE

## 2011-06-24 NOTE — Progress Notes (Signed)
ekg requested from Dr.Fusco @ [place] in [place]

## 2011-06-24 NOTE — Progress Notes (Signed)
Pt states that stress test was done last year d/t having "palpitations" but she had just gotten into a fight with her son and granddaughter was sent away.She said a stress test was done to rule out any issues with heart.It was Anxiety-per pt

## 2011-06-24 NOTE — Progress Notes (Signed)
Pt states another allergy but unsure;will call Dr.Fusco's office to find out this

## 2011-06-24 NOTE — Pre-Procedure Instructions (Signed)
20 Jane Wilkerson  06/24/2011   Your procedure is scheduled on:  Fri,Dec 28 @ 1:00pm  Report to [facility] at 1000 AM.  Call this number if you have problems the morning of surgery: [phone]   Remember:   Do not eat food:After Midnight.  May have clear liquids: up to 4 Hours before arrival.(until 6am)  Clear liquids include soda, tea, black coffee, apple or grape juice, broth.  Take these medicines the morning of surgery with A SIP OF WATER: Albuterol--Bring your inhaler with you!!Nexium,Synthroid,Lorazepam,Bystolic   Do not wear jewelry, make-up or nail polish.  Do not wear lotions, powders, or perfumes. You may wear deodorant.  Do not shave 48 hours prior to surgery.  Do not bring valuables to the hospital.  Contacts, dentures or bridgework may not be worn into surgery.  Leave suitcase in the car. After surgery it may be brought to your room.  For patients admitted to the hospital, checkout time is 11:00 AM the day of discharge.   Patients discharged the day of surgery will not be allowed to drive home.  Name and phone number of your driver:   Special Instructions: CHG Shower Use Special Wash: 1/2 bottle night before surgery and 1/2 bottle morning of surgery.   Please read over the following fact sheets that you were given: Pain Booklet, Coughing and Deep Breathing, MRSA Information and Surgical Site Infection Prevention

## 2011-06-24 NOTE — Progress Notes (Signed)
Cardiologist is Dr.Hilty with [facility];last visit early Nov 2012-to request report  Stress test done in 2011 to request report Echo done 5+yrs ago

## 2011-07-01 MED ORDER — CIPROFLOXACIN IN D5W 400 MG/200ML IV SOLN
400.0000 mg | INTRAVENOUS | Status: AC
  Administered 2011-07-02: 400 mg via INTRAVENOUS
  Filled 2011-07-01: qty 200

## 2011-07-02 ENCOUNTER — Encounter (HOSPITAL_COMMUNITY)
Admission: RE | Disposition: A | Discharge status: Home / Self Care | Payer: Self-pay | Source: Ambulatory Visit | Attending: Surgery

## 2011-07-02 ENCOUNTER — Other Ambulatory Visit (INDEPENDENT_AMBULATORY_CARE_PROVIDER_SITE_OTHER): Payer: Self-pay | Admitting: Surgery

## 2011-07-02 ENCOUNTER — Ambulatory Visit
Admission: RE | Admit: 2011-07-02 | Discharge: 2011-07-02 | Disposition: A | Discharge status: Home / Self Care | Payer: Medicare Other | Source: Ambulatory Visit | Attending: Surgery | Admitting: Surgery

## 2011-07-02 ENCOUNTER — Ambulatory Visit (HOSPITAL_COMMUNITY): Payer: Medicare Other | Admitting: *Deleted

## 2011-07-02 ENCOUNTER — Encounter (HOSPITAL_COMMUNITY): Payer: Self-pay | Admitting: *Deleted

## 2011-07-02 ENCOUNTER — Ambulatory Visit (HOSPITAL_COMMUNITY)
Admission: RE | Admit: 2011-07-02 | Discharge: 2011-07-02 | Disposition: A | Discharge status: Home / Self Care | Payer: Medicare Other | Source: Ambulatory Visit | Attending: Surgery | Admitting: Surgery

## 2011-07-02 DIAGNOSIS — D0590 Unspecified type of carcinoma in situ of unspecified breast: Secondary | ICD-10-CM | POA: Insufficient documentation

## 2011-07-02 DIAGNOSIS — Z01812 Encounter for preprocedural laboratory examination: Secondary | ICD-10-CM | POA: Insufficient documentation

## 2011-07-02 DIAGNOSIS — J45909 Unspecified asthma, uncomplicated: Secondary | ICD-10-CM | POA: Insufficient documentation

## 2011-07-02 DIAGNOSIS — Z01818 Encounter for other preprocedural examination: Secondary | ICD-10-CM | POA: Insufficient documentation

## 2011-07-02 DIAGNOSIS — D0510 Intraductal carcinoma in situ of unspecified breast: Secondary | ICD-10-CM

## 2011-07-02 DIAGNOSIS — K219 Gastro-esophageal reflux disease without esophagitis: Secondary | ICD-10-CM | POA: Insufficient documentation

## 2011-07-02 DIAGNOSIS — I10 Essential (primary) hypertension: Secondary | ICD-10-CM | POA: Insufficient documentation

## 2011-07-02 HISTORY — PX: BREAST LUMPECTOMY: SHX2

## 2011-07-02 SURGERY — BREAST LUMPECTOMY WITH SENTINEL LYMPH NODE BX
Anesthesia: General | Site: Breast | Laterality: Right | Wound class: Clean

## 2011-07-02 MED ORDER — METHYLENE BLUE 1 % INJ SOLN
INTRAMUSCULAR | Status: DC | PRN
  Administered 2011-07-02: 2 mL

## 2011-07-02 MED ORDER — PROMETHAZINE HCL 25 MG/ML IJ SOLN: 6.2500 mg | INTRAMUSCULAR | Status: DC | PRN

## 2011-07-02 MED ORDER — EPHEDRINE SULFATE 50 MG/ML IJ SOLN
INTRAMUSCULAR | Status: DC | PRN
  Administered 2011-07-02 (×6): 5 mg via INTRAVENOUS

## 2011-07-02 MED ORDER — ONDANSETRON HCL 4 MG/2ML IJ SOLN
INTRAMUSCULAR | Status: DC | PRN
  Administered 2011-07-02: 4 mg via INTRAVENOUS

## 2011-07-02 MED ORDER — LIDOCAINE HCL (CARDIAC) 20 MG/ML IV SOLN
INTRAVENOUS | Status: DC | PRN
  Administered 2011-07-02: 60 mg via INTRAVENOUS

## 2011-07-02 MED ORDER — SODIUM CHLORIDE 0.9 % IJ SOLN
INTRAMUSCULAR | Status: DC | PRN
  Administered 2011-07-02: 3 mL

## 2011-07-02 MED ORDER — MIDAZOLAM HCL 5 MG/5ML IJ SOLN
INTRAMUSCULAR | Status: DC | PRN
  Administered 2011-07-02: 2 mg via INTRAVENOUS

## 2011-07-02 MED ORDER — MEPERIDINE HCL 25 MG/ML IJ SOLN: 6.2500 mg | INTRAMUSCULAR | Status: DC | PRN

## 2011-07-02 MED ORDER — TECHNETIUM TC 99M SULFUR COLLOID FILTERED
1.0000 | Freq: Once | INTRAVENOUS | Status: AC | PRN
  Administered 2011-07-02: 1 via INTRADERMAL

## 2011-07-02 MED ORDER — PROPOFOL 10 MG/ML IV EMUL
INTRAVENOUS | Status: DC | PRN
  Administered 2011-07-02: 100 mg via INTRAVENOUS

## 2011-07-02 MED ORDER — FENTANYL CITRATE 0.05 MG/ML IJ SOLN
INTRAMUSCULAR | Status: DC | PRN
  Administered 2011-07-02 (×8): 25 ug via INTRAVENOUS

## 2011-07-02 MED ORDER — DEXAMETHASONE SODIUM PHOSPHATE 10 MG/ML IJ SOLN
INTRAMUSCULAR | Status: DC | PRN
  Administered 2011-07-02: 10 mg via INTRAVENOUS

## 2011-07-02 MED ORDER — HYDROMORPHONE HCL PF 1 MG/ML IJ SOLN
0.2500 mg | INTRAMUSCULAR | Status: DC | PRN
  Administered 2011-07-02 (×2): 0.25 mg via INTRAVENOUS

## 2011-07-02 MED ORDER — LACTATED RINGERS IV SOLN
INTRAVENOUS | Status: DC | PRN
  Administered 2011-07-02 (×2): via INTRAVENOUS

## 2011-07-02 MED ORDER — OXYCODONE-ACETAMINOPHEN 5-325 MG PO TABS: ORAL_TABLET | ORAL | Status: AC

## 2011-07-02 MED ORDER — BUPIVACAINE-EPINEPHRINE 0.25% -1:200000 IJ SOLN
INTRAMUSCULAR | Status: DC | PRN
  Administered 2011-07-02: 16 mL

## 2011-07-02 MED ORDER — DROPERIDOL 2.5 MG/ML IJ SOLN
INTRAMUSCULAR | Status: DC | PRN
  Administered 2011-07-02: 0.625 mg via INTRAVENOUS

## 2011-07-02 MED ORDER — KETOROLAC TROMETHAMINE 30 MG/ML IJ SOLN
INTRAMUSCULAR | Status: DC | PRN
  Administered 2011-07-02: 30 mg via INTRAVENOUS

## 2011-07-02 SURGICAL SUPPLY — 51 items
APPLIER CLIP 9.375 MED OPEN (MISCELLANEOUS)
BENZOIN TINCTURE PRP APPL 2/3 (GAUZE/BANDAGES/DRESSINGS) IMPLANT
BINDER BREAST LRG (GAUZE/BANDAGES/DRESSINGS) IMPLANT
BINDER BREAST XLRG (GAUZE/BANDAGES/DRESSINGS) IMPLANT
BLADE SURG 15 STRL LF DISP TIS (BLADE) IMPLANT
BLADE SURG 15 STRL SS (BLADE)
CANISTER SUCTION 2500CC (MISCELLANEOUS) ×2 IMPLANT
CHLORAPREP W/TINT 26ML (MISCELLANEOUS) ×2 IMPLANT
CLIP APPLIE 9.375 MED OPEN (MISCELLANEOUS) IMPLANT
CLOTH BEACON ORANGE TIMEOUT ST (SAFETY) ×2 IMPLANT
CONT SPEC 4OZ CLIKSEAL STRL BL (MISCELLANEOUS) ×8 IMPLANT
COVER PROBE W GEL 5X96 (DRAPES) ×2 IMPLANT
COVER SURGICAL LIGHT HANDLE (MISCELLANEOUS) ×2 IMPLANT
DEVICE DUBIN SPECIMEN MAMMOGRA (MISCELLANEOUS) ×2 IMPLANT
DRAPE LAPAROSCOPIC ABDOMINAL (DRAPES) ×2 IMPLANT
DRSG TEGADERM 2-3/8X2-3/4 SM (GAUZE/BANDAGES/DRESSINGS) ×2 IMPLANT
DRSG TEGADERM 4X4.75 (GAUZE/BANDAGES/DRESSINGS) ×2 IMPLANT
ELECT CAUTERY BLADE 6.4 (BLADE) IMPLANT
ELECT REM PT RETURN 9FT ADLT (ELECTROSURGICAL) ×2
ELECTRODE REM PT RTRN 9FT ADLT (ELECTROSURGICAL) ×1 IMPLANT
GLOVE BIO SURGEON STRL SZ 6.5 (GLOVE) ×2 IMPLANT
GLOVE BIOGEL PI IND STRL 7.0 (GLOVE) ×1 IMPLANT
GLOVE BIOGEL PI INDICATOR 7.0 (GLOVE) ×1
GLOVE SURG SIGNA 7.5 PF LTX (GLOVE) ×4 IMPLANT
GLOVE SURG SS PI 8.0 STRL IVOR (GLOVE) ×2 IMPLANT
GOWN PREVENTION PLUS XLARGE (GOWN DISPOSABLE) ×2 IMPLANT
GOWN STRL NON-REIN LRG LVL3 (GOWN DISPOSABLE) ×2 IMPLANT
KIT BASIN OR (CUSTOM PROCEDURE TRAY) ×2 IMPLANT
KIT MARKER MARGIN INK (KITS) ×2 IMPLANT
KIT ROOM TURNOVER OR (KITS) ×2 IMPLANT
NEEDLE 18GX1X1/2 (RX/OR ONLY) (NEEDLE) ×2 IMPLANT
NEEDLE HYPO 25GX1X1/2 BEV (NEEDLE) ×4 IMPLANT
NS IRRIG 1000ML POUR BTL (IV SOLUTION) ×2 IMPLANT
PACK GENERAL/GYN (CUSTOM PROCEDURE TRAY) ×2 IMPLANT
PACK SURGICAL SETUP 50X90 (CUSTOM PROCEDURE TRAY) IMPLANT
PAD ARMBOARD 7.5X6 YLW CONV (MISCELLANEOUS) ×4 IMPLANT
PENCIL BUTTON HOLSTER BLD 10FT (ELECTRODE) ×2 IMPLANT
SPONGE GAUZE 4X4 12PLY (GAUZE/BANDAGES/DRESSINGS) IMPLANT
SPONGE LAP 4X18 X RAY DECT (DISPOSABLE) IMPLANT
STRIP CLOSURE SKIN 1/2X4 (GAUZE/BANDAGES/DRESSINGS) IMPLANT
SUT MON AB 4-0 PC3 18 (SUTURE) ×4 IMPLANT
SUT SILK 2 0 SH (SUTURE) IMPLANT
SUT VIC AB 3-0 SH 27 (SUTURE) ×1
SUT VIC AB 3-0 SH 27XBRD (SUTURE) ×1 IMPLANT
SYR BULB 3OZ (MISCELLANEOUS) ×2 IMPLANT
SYR CONTROL 10ML LL (SYRINGE) ×4 IMPLANT
TOWEL OR 17X24 6PK STRL BLUE (TOWEL DISPOSABLE) ×2 IMPLANT
TOWEL OR 17X26 10 PK STRL BLUE (TOWEL DISPOSABLE) ×2 IMPLANT
TUBE CONNECTING 12X1/4 (SUCTIONS) IMPLANT
WATER STERILE IRR 1000ML POUR (IV SOLUTION) IMPLANT
YANKAUER SUCT BULB TIP NO VENT (SUCTIONS) IMPLANT

## 2011-07-02 NOTE — Interval H&P Note (Signed)
History and Physical Interval Note: she has had no change in her history or exam  07/02/2011 12:34 PM  Jane Wilkerson  has presented today for surgery, with the diagnosis of right breast cancer DCIS   The various methods of treatment have been discussed with the patient and family. After consideration of risks, benefits and other options for treatment, the patient has consented to  Procedure(s): RIGHT BREAST NEEDLE LOCALIZED LUMPECTOMY WITH SENTINEL LYMPH NODE BX as a surgical intervention .  The patients' history has been reviewed, patient examined, no change in status, stable for surgery.  I have reviewed the patients' chart and labs.  Questions were answered to the patient's satisfaction.     Aveion Nguyen A

## 2011-07-02 NOTE — Op Note (Signed)
07/02/2011  1:59 PM  PATIENT:  Jane Wilkerson  70 y.o. female  PRE-OPERATIVE DIAGNOSIS:  right breast cancer DCIS   POST-OPERATIVE DIAGNOSIS:  right breast cancer DCIS   PROCEDURE:  Procedure(s): RIGHT BREAST NEEDLE LOCALIZED LUMPECTOMY WITH SENTINEL LYMPH NODE BX  SURGEON:  Surgeon(s): Shelly Rubenstein, MD  PHYSICIAN ASSISTANT:   ASSISTANTS: none   ANESTHESIA:   local and general  EBL:     BLOOD ADMINISTERED:none  DRAINS: none   LOCAL MEDICATIONS USED:  MARCAINE 16CC  SPECIMEN:  Excision  DISPOSITION OF SPECIMEN:  PATHOLOGY  COUNTS:  YES  TOURNIQUET:  * No tourniquets in log *  DICTATION: .dictated on hospital system  [phone]  PLAN OF CARE: Discharge to home after PACU  PATIENT DISPOSITION:  PACU - hemodynamically stable.   Delay start of Pharmacological VTE agent (>24hrs) due to surgical blood loss or risk of bleeding:  {YES/NO/NOT APPLICABLE:20182

## 2011-07-02 NOTE — H&P (View-Only) (Signed)
Patient ID: Jane Wilkerson, female   DOB: 12/27/1940, 70 y.o.   MRN: [id number]  Chief Complaint  Patient presents with  . Breast Cancer    HPI Jane Wilkerson is a 70 y.o. female.   HPI This is a very pleasant female referred by Dr. Fusco for evaluation of abnormal calcifications in her right breast. She had undergone normal screening mammography with calcifications were found. She was uncertain of her last mammograms.  She has had no problems with her breasts. She denies nipple discharge. She had a benign biopsy of the left breast in the 1970s. She is otherwise without complaints.  No past medical history on file.  Past Surgical History  Procedure Date  . Abdominal hysterectomy     Family History  Problem Relation Age of Onset  . Cancer Mother     colon  . Cancer Father     pancreatic    Social History History  Substance Use Topics  . Smoking status: Never Smoker   . Smokeless tobacco: Not on file  . Alcohol Use: No    Allergies  Allergen Reactions  . Penicillins Rash    Current Outpatient Prescriptions  Medication Sig Dispense Refill  . albuterol (PROVENTIL HFA;VENTOLIN HFA) 108 (90 BASE) MCG/ACT inhaler Inhale 2 puffs into the lungs every 6 (six) hours as needed.        . aspirin 81 MG tablet Take 81 mg by mouth daily.        . Calcium Carbonate-Vit D-Min (CALCIUM 1200 PO) Take by mouth.        . esomeprazole (NEXIUM) 40 MG capsule Take 40 mg by mouth daily before breakfast.        . estradiol (VIVELLE-DOT) 0.05 MG/24HR Place 1 patch onto the skin once a week.        . fish oil-omega-3 fatty acids 1000 MG capsule Take 2 g by mouth daily.        . hydrochlorothiazide (HYDRODIURIL) 25 MG tablet Take 25 mg by mouth daily.        . hydrocortisone cream 0.5 % Apply topically 2 (two) times daily.        . levothyroxine (SYNTHROID, LEVOTHROID) 50 MCG tablet Take 50 mcg by mouth daily.        . LORazepam (ATIVAN) 1 MG tablet Take 1 mg by mouth every 4 (four) hours.        .  losartan (COZAAR) 100 MG tablet Take 100 mg by mouth daily.        . nebivolol (BYSTOLIC) 10 MG tablet Take 10 mg by mouth daily.        . simvastatin (ZOCOR) 40 MG tablet Take 40 mg by mouth at bedtime.         No current facility-administered medications for this visit.   Facility-Administered Medications Ordered in Other Visits  Medication Dose Route Frequency Provider Last Rate Last Dose  . gadobenate dimeglumine (MULTIHANCE) injection 7 mL  7 mL Intravenous Once PRN Medication Radiologist   7 mL at 06/15/11 1004    Review of Systems Review of Systems  HENT: Positive for postnasal drip.   Respiratory: Positive for shortness of breath.   Cardiovascular: Positive for palpitations.  Gastrointestinal: Negative.   Genitourinary: Negative.   Musculoskeletal: Positive for arthralgias.  Neurological: Negative.   Hematological: Bruises/bleeds easily.  Psychiatric/Behavioral: Negative.     Blood pressure 150/84, pulse 69, temperature 97.9 F (36.6 C), height 5' 4" (1.626 m), weight 152 lb 11.2 oz (69.264   kg).  Physical Exam Physical Exam  Constitutional: She is oriented to person, place, and time. She appears well-developed and well-nourished. No distress.  HENT:  Head: Normocephalic and atraumatic.  Right Ear: External ear normal.  Left Ear: External ear normal.  Nose: Nose normal.  Mouth/Throat: Oropharynx is clear and moist. No oropharyngeal exudate.  Eyes: Conjunctivae are normal. Pupils are equal, round, and reactive to light. No scleral icterus.  Neck: Normal range of motion. Neck supple. No tracheal deviation present. No thyromegaly present.  Cardiovascular: Normal rate, regular rhythm, normal heart sounds and intact distal pulses.   No murmur heard. Pulmonary/Chest: Effort normal and breath sounds normal. No respiratory distress. She has no wheezes. She has no rales.  Abdominal: Soft. Bowel sounds are normal. She exhibits no distension. There is no tenderness.    Musculoskeletal: She exhibits no edema and no tenderness.  Lymphadenopathy:    She has no cervical adenopathy.    She has no axillary adenopathy.       Right: No supraclavicular adenopathy present.       Left: No supraclavicular adenopathy present.  Neurological: She is alert and oriented to person, place, and time.  Skin: Skin is warm and dry. No rash noted. No erythema.  Psychiatric: Her behavior is normal. Judgment normal.  On examination of her breast, there are no palpable masses in either breast. Areola and nipples are normal. Her biopsy incision is well-healed.  Data Reviewed I reviewed the mammograms and pathology report. Rectal mammograms and MRI, the calcifications are in the right breast for an area of enhancement of 2.4 cm. There are no other abnormalities. The final pathology showed intermediate grade ER positive ductal carcinoma in situ with some necrosis  Assessment     Patient with ductal carcinoma in situ of the right breast.    Plan    I had a long discussion with the patient and her family regarding the diagnosis. She has been discussed thoroughly in the multidisciplinary breast conference. We are recommending a needle localized lumpectomy and sentinel lymph node biopsy. I discussed this with her in detail. She was also given literature.  I did discuss conservative measures versus mastectomy with her as well. She went to proceed with needle localized lumpectomy and sentinel node biopsy. I discussed the risk of surgery which includes but is not limited to bleeding, infection, injury to surrounding structures including nerves, need for further surgery should margins be positive, etc. She understands and wishes to proceed. Likelihood of success is good.       Mylinda Brook A 06/16/2011, 9:39 AM    

## 2011-07-02 NOTE — Anesthesia Postprocedure Evaluation (Signed)
  Anesthesia Post-op Note  Patient: Jane Wilkerson  Procedure(s) Performed:  BREAST LUMPECTOMY WITH SENTINEL LYMPH NODE BX  Patient Location: PACU  Anesthesia Type: General  Level of Consciousness: sedated  Airway and Oxygen Therapy: Patient Spontanous Breathing and Patient connected to nasal cannula oxygen  Post-op Pain: mild  Post-op Assessment: Post-op Vital signs reviewed, Patient's Cardiovascular Status Stable, Respiratory Function Stable and Patent Airway  Post-op Vital Signs: Reviewed and stable  Complications: No apparent anesthesia complications

## 2011-07-02 NOTE — Transfer of Care (Signed)
Immediate Anesthesia Transfer of Care Note  Patient: Jane Wilkerson  Procedure(s) Performed:  BREAST LUMPECTOMY WITH SENTINEL LYMPH NODE BX  Patient Location: PACU  Anesthesia Type: General  Level of Consciousness: awake and patient cooperative  Airway & Oxygen Therapy: Patient Spontanous Breathing and Patient connected to nasal cannula oxygen  Post-op Assessment: Report given to PACU RN and Post -op Vital signs reviewed and stable  Post vital signs: Reviewed  Complications: No apparent anesthesia complications

## 2011-07-02 NOTE — Op Note (Signed)
NAME:  Jane Wilkerson, Jane Wilkerson                ACCOUNT NO.:  [id number]  MEDICAL RECORD NO.:  [id number]  LOCATION:  [facility]                         FACILITY:  [facility]  PHYSICIAN:  [name] Miyamoto, M.D. DATE OF BIRTH:  07-09-1940  DATE OF PROCEDURE:  07/02/2011 DATE OF DISCHARGE:  07/02/2011                              OPERATIVE REPORT   PREOPERATIVE DIAGNOSIS:  Ductal carcinoma in situ of the right breast.  POSTOPERATIVE DIAGNOSIS:  Ductal carcinoma in situ of the right breast.  PROCEDURE:  Needle localized right breast lumpectomy and right sentinel lymph node biopsy.  SURGEON:  [name] Miyamoto, MD  ANESTHESIA:  General and 0.25% Marcaine.  ESTIMATED BLOOD LOSS:  Minimal.  INDICATIONS:  This is a 70 year old female who was found to have microcalcifications in the medial aspect of the right breast. Stereotactic biopsy was performed and showed ductal carcinoma in situ with necrosis.  Decision made to proceed with needle localized lumpectomy and sentinel lymph node biopsy.  FINDINGS:  The patient was found to have 3 sentinel lymph nodes, which were hot, but not blue.  These were sent to Pathology for evaluation. Radiology confirmed the suspicious area was in the specimen removed.  PROCEDURE IN DETAIL:  The patient was brought to the operating room, identified as Jane Wilkerson.  She had already had radioactive isotope injected in the right breast in the holding area, and had a needle localization wire placed at the Breast Center.  General anesthesia was induced after she was placed supine on the operating room table.  I then injected the blue dye underneath the areola of the right breast and thoroughly massaged the breast.  Her right breast and axilla were then prepped and draped in the usual sterile fashion.  The localization wire was at 3 o'clock position of the breast.  I made a transverse incision across the breast incorporating the wire.  I then did a wide lumpectomy of the upper inner  quadrant of the breast going down to the chest wall. Once the specimen was removed, I marked all quadrants with paint and x- ray confirmed that the suspicious area was in the specimen.  The medial area of the dissection was slightly fibrotic, so I took another medial margin there.  At this point, I packed the wound.  I then brought the Neoprobe onto the field and identified an area of increased uptake in the right axilla.  I then made a small incision with a scalpel.  I took this down to the breast tissue with electrocautery.  I was then with the aid of Neoprobe dissected out 3 sentinel lymph nodes.  All were hot, but none had any uptake of the blue dye.  At this point, hemostasis was achieved with cautery.  I anesthetized both wounds further with Marcaine.  I then irrigated with saline and achieved hemostasis with cautery.  I then closed subcutaneous tissue with both wounds with 3-0 Vicryl sutures, and closed the skin with running 4-0 Monocryl. Steri-Strips, gauze, and Tegaderm were then applied.  The patient tolerated the procedure well.  All counts were correct at the end of the procedure.  The patient was then extubated in operating room and taken in stable  condition to recovery room.     [name] Miyamoto, M.D.     DB/MEDQ  D:  07/02/2011  T:  07/02/2011  Job:  [id number]

## 2011-07-02 NOTE — Anesthesia Preprocedure Evaluation (Signed)
Anesthesia Evaluation  Patient identified by MRN, date of birth, ID band Patient awake    Reviewed: Allergy & Precautions, H&P , NPO status , Patient's Chart, lab work & pertinent test results  History of Anesthesia Complications (+) PONV  Airway Mallampati: II TM Distance: >3 FB Neck ROM: Full    Dental No notable dental hx. (+) Teeth Intact   Pulmonary shortness of breath and with exertion, asthma ,  clear to auscultation  Pulmonary exam normal       Cardiovascular hypertension, On Medications and On Home Beta Blockers Regular Normal    Neuro/Psych Negative Neurological ROS  Negative Psych ROS   GI/Hepatic Neg liver ROS, PUD, GERD-  Medicated and Controlled,  Endo/Other  Negative Endocrine ROS  Renal/GU negative Renal ROS  Genitourinary negative   Musculoskeletal   Abdominal   Peds  Hematology negative hematology ROS (+)   Anesthesia Other Findings   Reproductive/Obstetrics negative OB ROS                           Anesthesia Physical Anesthesia Plan  ASA: II  Anesthesia Plan: General   Post-op Pain Management:    Induction: Intravenous  Airway Management Planned: LMA  Additional Equipment:   Intra-op Plan:   Post-operative Plan: Extubation in OR  Informed Consent: I have reviewed the patients History and Physical, chart, labs and discussed the procedure including the risks, benefits and alternatives for the proposed anesthesia with the patient or authorized representative who has indicated his/her understanding and acceptance.     Plan Discussed with: CRNA and Surgeon  Anesthesia Plan Comments:         Anesthesia Quick Evaluation

## 2011-07-02 NOTE — Preoperative (Signed)
Beta Blockers   Reason not to administer Beta Blockers:Not Applicable; pt took Bystolic this am, qd dose.

## 2011-07-05 ENCOUNTER — Telehealth (INDEPENDENT_AMBULATORY_CARE_PROVIDER_SITE_OTHER): Payer: Self-pay | Admitting: Surgery

## 2011-07-07 ENCOUNTER — Encounter: Payer: Self-pay | Admitting: *Deleted

## 2011-07-07 ENCOUNTER — Encounter: Payer: Self-pay | Admitting: Oncology

## 2011-07-07 ENCOUNTER — Ambulatory Visit (HOSPITAL_BASED_OUTPATIENT_CLINIC_OR_DEPARTMENT_OTHER): Payer: Medicare Other | Admitting: Oncology

## 2011-07-07 DIAGNOSIS — F411 Generalized anxiety disorder: Secondary | ICD-10-CM | POA: Diagnosis not present

## 2011-07-07 DIAGNOSIS — F419 Anxiety disorder, unspecified: Secondary | ICD-10-CM | POA: Insufficient documentation

## 2011-07-07 DIAGNOSIS — C50919 Malignant neoplasm of unspecified site of unspecified female breast: Secondary | ICD-10-CM | POA: Diagnosis not present

## 2011-07-07 HISTORY — DX: Anxiety disorder, unspecified: F41.9

## 2011-07-12 NOTE — Progress Notes (Signed)
OFFICE PROGRESS NOTE  CC  Cassell Smiles., MD, MD [address] [place] [address]  DIAGNOSIS: 71 year old female with ductal carcinoma in situ of the right breast status post lumpectomy performed on 07/02/2011.  PRIOR THERAPY:  #1 patient is status post lumpectomy of the right breast done on 07/02/2011. The final pathology revealed a ductal carcinoma in situ. The tumor was ER +100% PR 0%.  #2 patient is now seen in medical oncology for adjuvant treatment.  CURRENT THERAPY:patient will be referred to Dr. Lurline Hare for consideration of postlumpectomy radiation.  INTERVAL HISTORY: 71 year old female seen in followup today after having had her lumpectomy with sentinel node biopsy performed on tell 07/02/2011. The final pathology revealed a high grade ductal carcinoma in situ with calcifications. In situ carcinoma was 4 mm from nearest margin which was the posterior margin. 3 sentinel nodes were negative for metastatic disease. Right medial margin also showed high-grade ductal carcinoma in situ the in situ carcinoma was present at the resection margin. The total tumor size was 1.5 cm. The tumor was ER positive PR negative. Postoperatively patient is doing well and is without significant complaints. Apparently patient was not yet seen by Dr. Carman Ching. But she did receive a call from his office and there is plans for reexcision of the margins.  Today patient denies any fevers chills night sweats headaches shortness of breath chest pains palpitations no myalgias or arthralgias. She does have a little bit of pain at the incision sites. There is no redness or erythema in the breast. Remainder of the 10 point review of systems is negative MEDICAL HISTORY: Past Medical History  Diagnosis Date  . PONV (postoperative nausea and vomiting)   . Hypertension     takes Bystolic and Losartan daily  . Hyperlipidemia     takes Zocor daily  . Asthma   . Shortness of  breath     with exertion  . Dizziness     occasionally  . Arthritis     back   . Chronic back pain   . Osteoporosis   . History of shingles 2011  . GERD (gastroesophageal reflux disease)     takes Nexium daily  . Gastric ulcer   . Hemorrhoids   . Hx of colonic polyps   . Diverticulosis   . Cancer     breast  . Urinary frequency   . Nocturia   . Hypothyroidism     takes Synthroid daily  . Cataract     right eye  . Anxiety     takes Ativan every 4hrs  . Anxiety 07/07/2011    ALLERGIES:  is allergic to hydrocodone; ziac; and penicillins.  MEDICATIONS:  Current Outpatient Prescriptions  Medication Sig Dispense Refill  . albuterol (PROVENTIL HFA;VENTOLIN HFA) 108 (90 BASE) MCG/ACT inhaler Inhale 2 puffs into the lungs every 6 (six) hours as needed.        . Calcium Carbonate-Vit D-Min (CALCIUM 1200 PO) Take by mouth.        . esomeprazole (NEXIUM) 40 MG capsule Take 40 mg by mouth 2 (two) times daily.       . fish oil-omega-3 fatty acids 1000 MG capsule Take 2 g by mouth daily.        . hydrochlorothiazide (HYDRODIURIL) 25 MG tablet Take 25 mg by mouth daily.        . hydrocortisone cream 0.5 % Apply topically 2 (two) times daily as needed. For pain      .  levothyroxine (SYNTHROID, LEVOTHROID) 50 MCG tablet Take 50 mcg by mouth daily.        Marland Kitchen LORazepam (ATIVAN) 1 MG tablet Take 1 mg by mouth every 4 (four) hours.       Marland Kitchen losartan (COZAAR) 100 MG tablet Take 100 mg by mouth daily.        . Multiple Vitamin (MULITIVITAMIN WITH MINERALS) TABS Take 1 tablet by mouth daily.        . nebivolol (BYSTOLIC) 10 MG tablet Take 10 mg by mouth daily.        Marland Kitchen oxyCODONE-acetaminophen (ROXICET) 5-325 MG per tablet Take 1 to 2 percocet as needed for pain  40 tablet  0  . simvastatin (ZOCOR) 40 MG tablet Take 40 mg by mouth at bedtime.          SURGICAL HISTORY:  Past Surgical History  Procedure Date  . Breast surgery 1972    left lumpectomy  . Abdominal hysterectomy 2001  . Hemorrhoid  surgery   . Colonscopy     REVIEW OF SYSTEMS:  Pertinent items are noted in HPI.   PHYSICAL EXAMINATION: General appearance: alert, cooperative and appears stated age Head: Normocephalic, without obvious abnormality, atraumatic Neck: no adenopathy, no carotid bruit, no JVD, supple, symmetrical, trachea midline and thyroid not enlarged, symmetric, no tenderness/mass/nodules Lymph nodes: Cervical, supraclavicular, and axillary nodes normal. Resp: clear to auscultation bilaterally and normal percussion bilaterally Back: symmetric, no curvature. ROM normal. No CVA tenderness. Cardio: regular rate and rhythm, S1, S2 normal, no murmur, click, rub or gallop GI: soft, non-tender; bowel sounds normal; no masses,  no organomegaly Extremities: extremities normal, atraumatic, no cyanosis or edema Neurologic: Alert and oriented X 3, normal strength and tone. Normal symmetric reflexes. Normal coordination and gait  ECOG PERFORMANCE STATUS: 1 - Symptomatic but completely ambulatory  Blood pressure 160/92, pulse 72, temperature 98.4 F (36.9 C), temperature source Oral, height 5\' 2"  (1.575 m), weight 153 lb 3.2 oz (69.491 kg).  LABORATORY DATA: Lab Results  Component Value Date   WBC 7.6 06/24/2011   HGB 12.7 06/24/2011   HCT 38.2 06/24/2011   MCV 89.0 06/24/2011   PLT 290 06/24/2011      Chemistry      Component Value Date/Time   NA 138 06/24/2011 1134   K 4.2 06/24/2011 1134   CL 98 06/24/2011 1134   CO2 31 06/24/2011 1134   BUN 12 06/24/2011 1134   CREATININE 1.16* 06/24/2011 1134      Component Value Date/Time   CALCIUM 9.6 06/24/2011 1134   ALKPHOS 57 06/16/2011 0828   AST 18 06/16/2011 0828   ALT 14 06/16/2011 0828   BILITOT 0.5 06/16/2011 0828       RADIOGRAPHIC STUDIES: None  ASSESSMENT: 71 year old female with high-grade ductal carcinoma in situ of the right breast status post lumpectomy with sentinel node biopsy. The final pathology revealed a 1.5 cm DCIS that is ER  positive PR negative. Postoperatively she is doing well. However there is margin involvement with a tumor. She is there will 4 gong to have a reexcision. Once she has had the reexcision she will be referred to Dr. Lurline Hare for consideration of postlumpectomy radiation therapy. I discussed the patient's pathology and in great deal today and the rationale for reexcision. We also discussed the rationale for postlumpectomy radiation therapy. Once patient completes her radiation therapy then we would consider her going on adjuvant antiestrogen therapy most likely consisting of either tamoxifen or a Aromasin for per prevention  of another breast cancer.   PLAN: patient will be seen back by Dr. Carman Ching sometime next week to plan for reexcision. Thereafter she will be seen by Dr. Lurline Hare I have gone ahead and referred the patient back to Dr. Michell Heinrich. And I will plan on seeing her back in about 3 months time or sooner if need arises.   All questions were answered. The patient knows to call the clinic with any problems, questions or concerns. We can certainly see the patient much sooner if necessary.  I spent 25 minutes counseling the patient face to face. The total time spent in the appointment was 30 minutes.    Drue Second, MD Medical/Oncology [facility] [phone] (beeper) [phone] (Office)  07/12/2011, 5:47 PM

## 2011-07-14 ENCOUNTER — Ambulatory Visit: Payer: Medicare Other | Admitting: Radiation Oncology

## 2011-07-14 ENCOUNTER — Ambulatory Visit: Payer: Medicare Other

## 2011-07-15 ENCOUNTER — Ambulatory Visit (INDEPENDENT_AMBULATORY_CARE_PROVIDER_SITE_OTHER): Payer: Medicare Other | Admitting: Surgery

## 2011-07-15 ENCOUNTER — Encounter (INDEPENDENT_AMBULATORY_CARE_PROVIDER_SITE_OTHER): Payer: Self-pay | Admitting: Surgery

## 2011-07-15 VITALS — BP 142/90 | HR 68 | Temp 97.6°F | Resp 18 | Ht 62.0 in | Wt 150.8 lb

## 2011-07-15 DIAGNOSIS — Z09 Encounter for follow-up examination after completed treatment for conditions other than malignant neoplasm: Secondary | ICD-10-CM

## 2011-07-15 NOTE — Progress Notes (Signed)
Subjective:     Patient ID: Jane Wilkerson, female   DOB: 1941/02/22, 71 y.o.   MRN: [id number]  HPI  She is here for her first postoperative visit status post needle localized right breast lumpectomy and sentinel lymph node biopsy. She is doing moderately well with minimal discomfort Review of Systems     Objective:   Physical Exam On exam, both incisions are healing well. There is some blistering of the skin from the Steri-Strips. There is no evidence of infection.  On the final pathology, all nodes were negative. I had taken a second medial margin and this was positive for ductal carcinoma in situ    Assessment:    Patient status post lumpectomy for ductal carcinoma in situ with positive medial margin*    Plan:     Reexcision of the medial margin is recommended to achieve negative margins. I discussed this with the patient and her husband in detail. I discussed the risks of surgery which includes but is not limited bleeding, infection, need for further surgery, injury, et Karie Soda. They understand and wished to proceed with reexcision. Surgical posterior schedule.

## 2011-07-27 ENCOUNTER — Encounter (HOSPITAL_COMMUNITY): Payer: Self-pay | Admitting: Pharmacy Technician

## 2011-08-04 ENCOUNTER — Encounter (HOSPITAL_COMMUNITY)
Admission: RE | Admit: 2011-08-04 | Discharge: 2011-08-04 | Disposition: A | Discharge status: Home / Self Care | Payer: Medicare Other | Source: Ambulatory Visit | Attending: Surgery | Admitting: Surgery

## 2011-08-04 ENCOUNTER — Encounter (HOSPITAL_COMMUNITY): Payer: Self-pay

## 2011-08-04 DIAGNOSIS — Z01812 Encounter for preprocedural laboratory examination: Secondary | ICD-10-CM | POA: Diagnosis not present

## 2011-08-04 DIAGNOSIS — D0590 Unspecified type of carcinoma in situ of unspecified breast: Secondary | ICD-10-CM | POA: Diagnosis not present

## 2011-08-04 HISTORY — DX: Major depressive disorder, single episode, unspecified: F32.9

## 2011-08-04 HISTORY — DX: Depression, unspecified: F32.A

## 2011-08-04 LAB — CBC
MCH: 29.4 pg (ref 26.0–34.0)
MCHC: 33.5 g/dL (ref 30.0–36.0)
Platelets: 268 10*3/uL (ref 150–400)
RDW: 13.3 % (ref 11.5–15.5)

## 2011-08-04 LAB — BASIC METABOLIC PANEL
BUN: 12 mg/dL (ref 6–23)
Calcium: 10 mg/dL (ref 8.4–10.5)
Creatinine, Ser: 1.18 mg/dL — ABNORMAL HIGH (ref 0.50–1.10)
GFR calc Af Amer: 53 mL/min — ABNORMAL LOW (ref >90)
GFR calc non Af Amer: 46 mL/min — ABNORMAL LOW (ref >90)
Glucose, Bld: 98 mg/dL (ref 70–99)

## 2011-08-04 LAB — SURGICAL PCR SCREEN: MRSA, PCR: NEGATIVE

## 2011-08-04 NOTE — Pre-Procedure Instructions (Signed)
21 Jane Wilkerson  08/04/2011   Your procedure is scheduled on:   Wed, Feb 6  Report to [facility] at 0630 AM.  Call this number if you have problems the morning of surgery: [phone]   Remember:   Do not eat food:After Midnight.  May have clear liquids: up to 4 Hours before arrival.  Clear liquids include soda, tea, black coffee, apple or grape juice, broth.  Take these medicines the morning of surgery with A SIP OF WATER: *Nexium,Bystolic,Lorazepam, Thyroid**   Do not wear jewelry, make-up or nail polish.  Do not wear lotions, powders, or perfumes. You may wear deodorant.  Do not shave 48 hours prior to surgery.  Do not bring valuables to the hospital.  Contacts, dentures or bridgework may not be worn into surgery.  Leave suitcase in the car. After surgery it may be brought to your room.  For patients admitted to the hospital, checkout time is 11:00 AM the day of discharge.   Patients discharged the day of surgery will not be allowed to drive home.  Name and phone number of your driver: Fayrene Fearing**  Special Instructions: CHG Shower Use Special Wash: 1/2 bottle night before surgery and 1/2 bottle morning of surgery.   Please read over the following fact sheets that you were given: Pain Booklet, Coughing and Deep Breathing, MRSA Information and Surgical Site Infection Prevention

## 2011-08-05 DIAGNOSIS — F329 Major depressive disorder, single episode, unspecified: Secondary | ICD-10-CM | POA: Diagnosis not present

## 2011-08-05 DIAGNOSIS — D493 Neoplasm of unspecified behavior of breast: Secondary | ICD-10-CM | POA: Diagnosis not present

## 2011-08-05 DIAGNOSIS — F411 Generalized anxiety disorder: Secondary | ICD-10-CM | POA: Diagnosis not present

## 2011-08-10 MED ORDER — CIPROFLOXACIN IN D5W 400 MG/200ML IV SOLN
400.0000 mg | INTRAVENOUS | Status: AC
  Administered 2011-08-11: 400 mg via INTRAVENOUS
  Filled 2011-08-10: qty 200

## 2011-08-10 NOTE — H&P (Signed)
Jane Wilkerson is an 71 y.o. female.   Chief Complaint: right breast dcis HPI: just had right breast needle loc.  Pathology showed +margines.  Now presents for reexcision of lumpectomy site  Past Medical History  Diagnosis Date  . PONV (postoperative nausea and vomiting)   . Hyperlipidemia     takes Zocor daily  . Shortness of breath     with exertion  . Dizziness     occasionally  . Arthritis     back   . Chronic back pain   . Osteoporosis   . History of shingles 2011  . GERD (gastroesophageal reflux disease)     takes Nexium daily  . Gastric ulcer   . Hemorrhoids   . Hx of colonic polyps   . Diverticulosis   . Cancer     breast  . Urinary frequency   . Nocturia   . Hypothyroidism     takes Synthroid daily  . Cataract     right eye  . Anxiety     takes Ativan every 4hrs  . Anxiety 07/07/2011  . Hypertension     takes Bystolic and Losartan daily  . Asthma     uses inhaler prn  . Depression     has appt to discuss depression 07/26/2011    Past Surgical History  Procedure Date  . Abdominal hysterectomy 2001  . Hemorrhoid surgery   . Colonscopy   . Breast surgery 1972    left lumpectomy  . Breast lumpectomy 07/02/11    right breast and SNL    Family History  Problem Relation Age of Onset  . Cancer Mother     colon  . Cancer Father     pancreatic  . Anesthesia problems Neg Hx   . Hypotension Neg Hx   . Malignant hyperthermia Neg Hx   . Pseudochol deficiency Neg Hx    Social History:  reports that she has never smoked. She does not have any smokeless tobacco history on file. She reports that she does not drink alcohol or use illicit drugs.  Allergies:  Allergies  Allergen Reactions  . Hydrocodone Other (See Comments)    Reaction unknown  . Oxycodone Hcl Itching  . Ziac (Bisoprolol-Hydrochlorothiazide) Other (See Comments)    Reaction unknown  . Penicillins Rash    Medications Prior to Admission  Medication Dose Route Frequency Provider Last Rate  Last Dose  . ciprofloxacin (CIPRO) IVPB 400 mg  400 mg Intravenous 120 min pre-op Shelly Rubenstein, MD       Medications Prior to Admission  Medication Sig Dispense Refill  . albuterol (PROVENTIL HFA;VENTOLIN HFA) 108 (90 BASE) MCG/ACT inhaler Inhale 2 puffs into the lungs every 6 (six) hours as needed. For shortness of breath      . esomeprazole (NEXIUM) 40 MG capsule Take 40 mg by mouth 2 (two) times daily.       . fish oil-omega-3 fatty acids 1000 MG capsule Take 2 g by mouth daily.       . hydrocortisone cream 0.5 % Apply 1 application topically 2 (two) times daily as needed. For hemorrhoid pain      . LORazepam (ATIVAN) 1 MG tablet Take 1 mg by mouth every 6 (six) hours as needed. For anxiety      . losartan (COZAAR) 100 MG tablet Take 100 mg by mouth daily.        . nebivolol (BYSTOLIC) 10 MG tablet Take 10 mg by mouth daily.        Marland Kitchen  simvastatin (ZOCOR) 40 MG tablet Take 40 mg by mouth at bedtime.          No results found for this or any previous visit (from the past 48 hour(s)). No results found.  Review of Systems  All other systems reviewed and are negative.    There were no vitals taken for this visit. Physical Exam  Constitutional: She is oriented to person, place, and time. She appears well-developed and well-nourished. No distress.  HENT:  Head: Normocephalic and atraumatic.  Eyes: Conjunctivae are normal. Pupils are equal, round, and reactive to light.  Neck: Normal range of motion. Neck supple.  Cardiovascular: Normal rate, regular rhythm, normal heart sounds and intact distal pulses.   Respiratory: Effort normal and breath sounds normal. No respiratory distress. She has no wheezes.  GI: Soft. Bowel sounds are normal.  Musculoskeletal: Normal range of motion.  Neurological: She is alert and oriented to person, place, and time.  Skin: Skin is warm and dry. No erythema.  Psychiatric: Her behavior is normal. Judgment normal.     Assessment/Plan Pt with DCIS of  right breast s/p lumpectomy with findings of positive margins on final path.  Re-excision to achieve negative margins recommended.  Risks discussed in detail in office.  Likelihood of success is moderate  Pj Zehner A 08/10/2011, 5:03 PM

## 2011-08-11 ENCOUNTER — Encounter (HOSPITAL_COMMUNITY): Payer: Self-pay | Admitting: Vascular Surgery

## 2011-08-11 ENCOUNTER — Ambulatory Visit (HOSPITAL_COMMUNITY)
Admission: RE | Admit: 2011-08-11 | Discharge: 2011-08-11 | Disposition: A | Discharge status: Home / Self Care | Payer: Medicare Other | Source: Ambulatory Visit | Attending: Surgery | Admitting: Surgery

## 2011-08-11 ENCOUNTER — Telehealth (INDEPENDENT_AMBULATORY_CARE_PROVIDER_SITE_OTHER): Payer: Self-pay | Admitting: General Surgery

## 2011-08-11 ENCOUNTER — Ambulatory Visit (HOSPITAL_COMMUNITY): Payer: Medicare Other | Admitting: Anesthesiology

## 2011-08-11 ENCOUNTER — Encounter (HOSPITAL_COMMUNITY): Payer: Self-pay | Admitting: *Deleted

## 2011-08-11 ENCOUNTER — Other Ambulatory Visit (INDEPENDENT_AMBULATORY_CARE_PROVIDER_SITE_OTHER): Payer: Self-pay | Admitting: Surgery

## 2011-08-11 ENCOUNTER — Encounter (HOSPITAL_COMMUNITY): Payer: Self-pay | Admitting: Anesthesiology

## 2011-08-11 ENCOUNTER — Encounter (HOSPITAL_COMMUNITY)
Admission: RE | Disposition: A | Discharge status: Home / Self Care | Payer: Self-pay | Source: Ambulatory Visit | Attending: Surgery

## 2011-08-11 DIAGNOSIS — Z01812 Encounter for preprocedural laboratory examination: Secondary | ICD-10-CM | POA: Diagnosis not present

## 2011-08-11 DIAGNOSIS — D0590 Unspecified type of carcinoma in situ of unspecified breast: Secondary | ICD-10-CM | POA: Diagnosis not present

## 2011-08-11 DIAGNOSIS — N6019 Diffuse cystic mastopathy of unspecified breast: Secondary | ICD-10-CM | POA: Diagnosis not present

## 2011-08-11 DIAGNOSIS — C50919 Malignant neoplasm of unspecified site of unspecified female breast: Secondary | ICD-10-CM | POA: Diagnosis not present

## 2011-08-11 HISTORY — PX: BREAST SURGERY: SHX581

## 2011-08-11 HISTORY — DX: Nausea: R11.0

## 2011-08-11 SURGERY — EXCISION, LESION, BREAST
Anesthesia: General | Site: Breast | Laterality: Right | Wound class: Clean

## 2011-08-11 MED ORDER — EPHEDRINE SULFATE 50 MG/ML IJ SOLN
INTRAMUSCULAR | Status: DC | PRN
  Administered 2011-08-11: 15 mg via INTRAVENOUS
  Administered 2011-08-11: 10 mg via INTRAVENOUS

## 2011-08-11 MED ORDER — MORPHINE SULFATE 2 MG/ML IJ SOLN: 2.0000 mg | INTRAMUSCULAR | Status: DC | PRN

## 2011-08-11 MED ORDER — 0.9 % SODIUM CHLORIDE (POUR BTL) OPTIME
TOPICAL | Status: DC | PRN
  Administered 2011-08-11: 1000 mL

## 2011-08-11 MED ORDER — SODIUM CHLORIDE 0.9 % IJ SOLN: 3.0000 mL | Freq: Two times a day (BID) | INTRAMUSCULAR | Status: DC

## 2011-08-11 MED ORDER — LACTATED RINGERS IV SOLN
INTRAVENOUS | Status: DC | PRN
  Administered 2011-08-11: 08:00:00 via INTRAVENOUS

## 2011-08-11 MED ORDER — FENTANYL CITRATE 0.05 MG/ML IJ SOLN
INTRAMUSCULAR | Status: DC | PRN
  Administered 2011-08-11: 100 ug via INTRAVENOUS

## 2011-08-11 MED ORDER — SODIUM CHLORIDE 0.9 % IV SOLN: 250.0000 mL | INTRAVENOUS | Status: DC | PRN

## 2011-08-11 MED ORDER — ONDANSETRON HCL 4 MG/2ML IJ SOLN: 4.0000 mg | Freq: Four times a day (QID) | INTRAMUSCULAR | Status: DC | PRN

## 2011-08-11 MED ORDER — HYDROMORPHONE HCL PF 1 MG/ML IJ SOLN: 0.2500 mg | INTRAMUSCULAR | Status: DC | PRN

## 2011-08-11 MED ORDER — SODIUM CHLORIDE 0.9 % IJ SOLN: 3.0000 mL | INTRAMUSCULAR | Status: DC | PRN

## 2011-08-11 MED ORDER — ONDANSETRON HCL 4 MG/2ML IJ SOLN: 4.0000 mg | Freq: Once | INTRAMUSCULAR | Status: DC | PRN

## 2011-08-11 MED ORDER — BUPIVACAINE-EPINEPHRINE 0.25% -1:200000 IJ SOLN
INTRAMUSCULAR | Status: DC | PRN
  Administered 2011-08-11: 20 mL

## 2011-08-11 MED ORDER — PROPOFOL 10 MG/ML IV EMUL
INTRAVENOUS | Status: DC | PRN
  Administered 2011-08-11: 140 mg via INTRAVENOUS

## 2011-08-11 MED ORDER — OXYCODONE HCL 5 MG PO TABS: 5.0000 mg | ORAL_TABLET | ORAL | Status: DC | PRN

## 2011-08-11 MED ORDER — ACETAMINOPHEN 325 MG PO TABS: 650.0000 mg | ORAL_TABLET | ORAL | Status: DC | PRN

## 2011-08-11 MED ORDER — OXYCODONE-ACETAMINOPHEN 5-325 MG PO TABS: ORAL_TABLET | ORAL | Status: AC

## 2011-08-11 MED ORDER — PROMETHAZINE HCL 25 MG/ML IJ SOLN: 12.5000 mg | Freq: Four times a day (QID) | INTRAMUSCULAR | Status: DC | PRN

## 2011-08-11 MED ORDER — ONDANSETRON HCL 4 MG/2ML IJ SOLN
INTRAMUSCULAR | Status: DC | PRN
  Administered 2011-08-11: 4 mg via INTRAVENOUS

## 2011-08-11 MED ORDER — ACETAMINOPHEN 650 MG RE SUPP: 650.0000 mg | RECTAL | Status: DC | PRN

## 2011-08-11 MED ORDER — MORPHINE SULFATE 2 MG/ML IJ SOLN: 0.0500 mg/kg | INTRAMUSCULAR | Status: DC | PRN

## 2011-08-11 MED ORDER — MEPERIDINE HCL 25 MG/ML IJ SOLN: 6.2500 mg | INTRAMUSCULAR | Status: DC | PRN

## 2011-08-11 MED ORDER — MIDAZOLAM HCL 5 MG/5ML IJ SOLN
INTRAMUSCULAR | Status: DC | PRN
  Administered 2011-08-11: 2 mg via INTRAVENOUS

## 2011-08-11 SURGICAL SUPPLY — 39 items
BENZOIN TINCTURE PRP APPL 2/3 (GAUZE/BANDAGES/DRESSINGS) IMPLANT
BLADE SURG 15 STRL LF DISP TIS (BLADE) ×1 IMPLANT
BLADE SURG 15 STRL SS (BLADE) ×1
CHLORAPREP W/TINT 26ML (MISCELLANEOUS) ×2 IMPLANT
CLOTH BEACON ORANGE TIMEOUT ST (SAFETY) ×2 IMPLANT
CONT SPEC 4OZ CLIKSEAL STRL BL (MISCELLANEOUS) ×2 IMPLANT
COVER SURGICAL LIGHT HANDLE (MISCELLANEOUS) ×2 IMPLANT
DECANTER SPIKE VIAL GLASS SM (MISCELLANEOUS) ×2 IMPLANT
DRAPE CHEST BREAST 15X10 FENES (DRAPES) IMPLANT
DRAPE PED LAPAROTOMY (DRAPES) ×2 IMPLANT
ELECT CAUTERY BLADE 6.4 (BLADE) ×2 IMPLANT
ELECT REM PT RETURN 9FT ADLT (ELECTROSURGICAL) ×2
ELECTRODE REM PT RTRN 9FT ADLT (ELECTROSURGICAL) ×1 IMPLANT
GLOVE BIO SURGEON STRL SZ7.5 (GLOVE) ×2 IMPLANT
GLOVE BIOGEL PI IND STRL 7.5 (GLOVE) ×1 IMPLANT
GLOVE BIOGEL PI INDICATOR 7.5 (GLOVE) ×1
GLOVE ECLIPSE 7.0 STRL STRAW (GLOVE) ×2 IMPLANT
GLOVE SURG SIGNA 7.5 PF LTX (GLOVE) ×2 IMPLANT
GOWN PREVENTION PLUS XLARGE (GOWN DISPOSABLE) ×2 IMPLANT
GOWN STRL NON-REIN LRG LVL3 (GOWN DISPOSABLE) ×2 IMPLANT
KIT BASIN OR (CUSTOM PROCEDURE TRAY) ×2 IMPLANT
KIT ROOM TURNOVER OR (KITS) ×2 IMPLANT
NEEDLE HYPO 25GX1X1/2 BEV (NEEDLE) ×2 IMPLANT
NS IRRIG 1000ML POUR BTL (IV SOLUTION) ×2 IMPLANT
PACK SURGICAL SETUP 50X90 (CUSTOM PROCEDURE TRAY) ×2 IMPLANT
PAD ARMBOARD 7.5X6 YLW CONV (MISCELLANEOUS) ×2 IMPLANT
PENCIL BUTTON HOLSTER BLD 10FT (ELECTRODE) ×2 IMPLANT
SPONGE GAUZE 4X4 12PLY (GAUZE/BANDAGES/DRESSINGS) ×2 IMPLANT
SPONGE LAP 18X18 X RAY DECT (DISPOSABLE) ×2 IMPLANT
STRIP CLOSURE SKIN 1/2X4 (GAUZE/BANDAGES/DRESSINGS) ×2 IMPLANT
SUT MNCRL AB 4-0 PS2 18 (SUTURE) ×2 IMPLANT
SUT VIC AB 3-0 SH 27 (SUTURE) ×1
SUT VIC AB 3-0 SH 27X BRD (SUTURE) ×1 IMPLANT
SYR BULB 3OZ (MISCELLANEOUS) ×2 IMPLANT
SYR CONTROL 10ML LL (SYRINGE) ×2 IMPLANT
TAPE CLOTH SURG 4X10 WHT LF (GAUZE/BANDAGES/DRESSINGS) ×2 IMPLANT
TOWEL OR 17X24 6PK STRL BLUE (TOWEL DISPOSABLE) IMPLANT
TOWEL OR 17X26 10 PK STRL BLUE (TOWEL DISPOSABLE) ×2 IMPLANT
TOWEL OR NON WOVEN STRL DISP B (DISPOSABLE) ×2 IMPLANT

## 2011-08-11 NOTE — Anesthesia Preprocedure Evaluation (Addendum)
Anesthesia Evaluation  Patient identified by MRN, date of birth, ID band Patient awake    Reviewed: Allergy & Precautions, H&P , NPO status , Patient's Chart, lab work & pertinent test results, reviewed documented beta blocker date and time   History of Anesthesia Complications (+) PONV  Airway Mallampati: II TM Distance: >3 FB Neck ROM: Full    Dental  (+) Teeth Intact and Dental Advisory Given   Pulmonary shortness of breath, asthma ,  clear to auscultation        Cardiovascular hypertension, Pt. on medications Regular Normal    Neuro/Psych PSYCHIATRIC DISORDERS Anxiety    GI/Hepatic PUD, GERD-  Medicated and Controlled,  Endo/Other  Hypothyroidism   Renal/GU      Musculoskeletal   Abdominal   Peds  Hematology   Anesthesia Other Findings   Reproductive/Obstetrics                          Anesthesia Physical Anesthesia Plan  ASA: II  Anesthesia Plan: General   Post-op Pain Management:    Induction: Intravenous  Airway Management Planned: LMA  Additional Equipment:   Intra-op Plan:   Post-operative Plan: Extubation in OR  Informed Consent: I have reviewed the patients History and Physical, chart, labs and discussed the procedure including the risks, benefits and alternatives for the proposed anesthesia with the patient or authorized representative who has indicated his/her understanding and acceptance.   Dental advisory given  Plan Discussed with: Anesthesiologist, CRNA and Surgeon  Anesthesia Plan Comments:        Anesthesia Quick Evaluation

## 2011-08-11 NOTE — Interval H&P Note (Signed)
History and Physical Interval Note: she has had no change in her history or exam  08/11/2011 7:30 AM  Jaclyn Shaggy Cogan  has presented today for surgery, with the diagnosis of positive margin breast cancer  The various methods of treatment have been discussed with the patient and family. After consideration of risks, benefits and other options for treatment, the patient has consented to  Procedure(s): RE-EXCISION OF RIGHT BREAST LUMPECTOMY as a surgical intervention .  The patients' history has been reviewed, patient examined, no change in status, stable for surgery.  I have reviewed the patients' chart and labs.  Questions were answered to the patient's satisfaction.     Corene Resnick A

## 2011-08-11 NOTE — Op Note (Signed)
08/11/2011  8:55 AM  PATIENT:  Jane Wilkerson  71 y.o. female  PRE-OPERATIVE DIAGNOSIS:  positive margin breast cancer right breast DCIS  POST-OPERATIVE DIAGNOSIS:  positive margin breast cancer  PROCEDURE:  Procedure(s): RE-EXCISION OF RIGHT BREAST LUMPECTOMY SITE  SURGEON:  Surgeon(s): Shelly Rubenstein, MD  PHYSICIAN ASSISTANT:   ASSISTANTS: none   ANESTHESIA:   general  EBL:  Total I/O In: 800 [I.V.:800] Out: 0   BLOOD ADMINISTERED:none  DRAINS: none   LOCAL MEDICATIONS USED:  MARCAINE 20CC  SPECIMEN:  Excision  DISPOSITION OF SPECIMEN:  PATHOLOGY  COUNTS:  YES  TOURNIQUET:  * No tourniquets in log *  DICTATION: .Dragon Dictation Indications: This is a 71 year old female who had undergone a needle localized right breast lumpectomy and sentinel node biopsy for ductal carcinoma in situ. Nodes were negative. The posterior medial margin however was positive. The decision was made to re\re excise this area.  Procedure: The patient was brought to the operating room and identified as the correct patient. She was placed supine on the operating room table and general anesthesia was induced. Her right breast was then prepped and draped in the usual sterile fashion. Using a scalpel, I excised the patient's previous scar in the upper-inner quadrant of the right breast. I took this down into the breast tissue with electrocautery. I then again performed a wide lumpectomy going far medial and posterior to the biopsy site. This took the biopsy site all the way down to the chest wall. There was a lot of calcified breast tissue medial toward the nipple. Once the specimen was excised it was sent to pathology. Hemostasis was achieved with the cautery. I anesthetized the wound with Marcaine. I then closed the subcutaneous tissue with interrupted 3-0 Vicryl sutures and closed the skin with a running 4-0 Monocryl suture. Steri-Strips and gauze were applied. The patient tolerated the procedure  well.  She was then taken in a stable condition from the operating room to the recovery. All the counts were correct. PLAN OF CARE: Discharge to home after PACU  PATIENT DISPOSITION:  PACU - hemodynamically stable.   Delay start of Pharmacological VTE agent (>24hrs) due to surgical blood loss or risk of bleeding:  {YES/NO/NOT APPLICABLE:20182

## 2011-08-11 NOTE — Telephone Encounter (Signed)
Call in ultram 50mg  1 to 2 po q 4 to 6 hours prn #25

## 2011-08-11 NOTE — Telephone Encounter (Signed)
rx called to [facility] spoke with Jeanice Lim Ultram 50mg  1-2 PO Q 4-6 HOURS PRN, #25

## 2011-08-11 NOTE — Preoperative (Signed)
Beta Blockers   Reason not to administer Beta Blockers:Not Applicable. No home beta blockers 

## 2011-08-11 NOTE — Anesthesia Procedure Notes (Signed)
Procedure Name: LMA Insertion Date/Time: 08/11/2011 8:20 AM Performed by: Margaree Mackintosh Pre-anesthesia Checklist: Patient identified, Emergency Drugs available, Suction available, Patient being monitored and Timeout performed Patient Re-evaluated:Patient Re-evaluated prior to inductionOxygen Delivery Method: Circle System Utilized Preoxygenation: Pre-oxygenation with 100% oxygen Intubation Type: IV induction Ventilation: Mask ventilation without difficulty LMA: LMA inserted LMA Size: 4.0 Number of attempts: 1 Placement Confirmation: positive ETCO2 and breath sounds checked- equal and bilateral Tube secured with: Tape Dental Injury: Teeth and Oropharynx as per pre-operative assessment

## 2011-08-11 NOTE — Anesthesia Postprocedure Evaluation (Signed)
  Anesthesia Post-op Note  Patient: Jane Wilkerson  Procedure(s) Performed:  RE-EXCISION OF BREAST LUMPECTOMY - Re-excision ductal carcinoma in situ right breast posterior medial margin  Patient Location: PACU  Anesthesia Type: General  Level of Consciousness: awake, alert  and oriented  Airway and Oxygen Therapy: Patient Spontanous Breathing  Post-op Pain: mild  Post-op Assessment: Post-op Vital signs reviewed, Patient's Cardiovascular Status Stable, Respiratory Function Stable, Patent Airway, No signs of Nausea or vomiting and Pain level controlled  Post-op Vital Signs: Reviewed and stable  Complications: No apparent anesthesia complications

## 2011-08-11 NOTE — Transfer of Care (Signed)
Immediate Anesthesia Transfer of Care Note  Patient: Jane Wilkerson  Procedure(s) Performed:  RE-EXCISION OF BREAST LUMPECTOMY - Re-excision ductal carcinoma in situ right breast posterior medial margin  Patient Location: PACU  Anesthesia Type: General  Level of Consciousness: awake and sedated  Airway & Oxygen Therapy: Patient Spontanous Breathing and Patient connected to nasal cannula oxygen  Post-op Assessment: Report given to PACU RN and Post -op Vital signs reviewed and stable  Post vital signs: Reviewed  Complications: No apparent anesthesia complications

## 2011-08-11 NOTE — Telephone Encounter (Signed)
The patients husband contacted the office stating an rx was written upon discharge for oxycodone and his wife found out in December she is allergic to it. Would like another pain medication.

## 2011-08-12 ENCOUNTER — Telehealth (INDEPENDENT_AMBULATORY_CARE_PROVIDER_SITE_OTHER): Payer: Self-pay | Admitting: General Surgery

## 2011-08-12 NOTE — Telephone Encounter (Signed)
[name] contacted the office stating that Jane Wilkerson has chills and has a fever of 100, pt is instructed to take Tylenol and I will call her back with further instructions. Paged Dr Magnus Ivan for advise

## 2011-08-12 NOTE — Telephone Encounter (Signed)
Spoke with Dr Magnus Ivan, if the area is not red and hot to the touch, or signs of infection she should continue taking tylenol for the fever and contact us with any changes. Called the patients husband and he understood instructions

## 2011-08-18 ENCOUNTER — Encounter: Payer: Self-pay | Admitting: Oncology

## 2011-08-18 ENCOUNTER — Ambulatory Visit (HOSPITAL_BASED_OUTPATIENT_CLINIC_OR_DEPARTMENT_OTHER): Payer: Medicare Other | Admitting: Oncology

## 2011-08-18 ENCOUNTER — Other Ambulatory Visit: Payer: Medicare Other | Admitting: Lab

## 2011-08-18 ENCOUNTER — Telehealth: Payer: Self-pay | Admitting: *Deleted

## 2011-08-18 DIAGNOSIS — C50919 Malignant neoplasm of unspecified site of unspecified female breast: Secondary | ICD-10-CM | POA: Diagnosis not present

## 2011-08-18 DIAGNOSIS — D0510 Intraductal carcinoma in situ of unspecified breast: Secondary | ICD-10-CM

## 2011-08-18 DIAGNOSIS — F419 Anxiety disorder, unspecified: Secondary | ICD-10-CM

## 2011-08-18 DIAGNOSIS — F411 Generalized anxiety disorder: Secondary | ICD-10-CM

## 2011-08-18 DIAGNOSIS — D0590 Unspecified type of carcinoma in situ of unspecified breast: Secondary | ICD-10-CM | POA: Diagnosis not present

## 2011-08-18 DIAGNOSIS — C50419 Malignant neoplasm of upper-outer quadrant of unspecified female breast: Secondary | ICD-10-CM

## 2011-08-18 LAB — CBC WITH DIFFERENTIAL/PLATELET
Eosinophils Absolute: 0.2 10*3/uL (ref 0.0–0.5)
MONO#: 0.8 10*3/uL (ref 0.1–0.9)
NEUT#: 4.4 10*3/uL (ref 1.5–6.5)
RBC: 4.07 10*6/uL (ref 3.70–5.45)
RDW: 14 % (ref 11.2–14.5)
WBC: 6.8 10*3/uL (ref 3.9–10.3)
lymph#: 1.3 10*3/uL (ref 0.9–3.3)

## 2011-08-18 LAB — BASIC METABOLIC PANEL
CO2: 30 mEq/L (ref 19–32)
Glucose, Bld: 96 mg/dL (ref 70–99)
Potassium: 4 mEq/L (ref 3.5–5.3)
Sodium: 137 mEq/L (ref 135–145)

## 2011-08-18 NOTE — Progress Notes (Signed)
OFFICE PROGRESS NOTE  CC  Cassell Smiles., MD, MD [address] [place] [address]  DIAGNOSIS: 71 year old female with ductal carcinoma in situ of the right breast status post lumpectomy performed on 07/02/2011.  PRIOR THERAPY:  #1 patient is status post lumpectomy of the right breast done on 07/02/2011. The final pathology revealed a ductal carcinoma in situ. The tumor was ER +100% PR 0%.  #2 Patient is s/p re-excision of the margins anre final pathology is negative for any residual carcinoma.  #3 To be seen by Dr. Michell Heinrich for radiation consult  CURRENT THERAPY:patient will be referred to Dr. Lurline Hare for consideration of postlumpectomy radiation.  INTERVAL HISTORY: 71 year old female seen in followup today after having had her lumpectomy with sentinel node biopsy performed on tell 07/02/2011. The final pathology revealed a high grade ductal carcinoma in situ with calcifications. In situ carcinoma was 4 mm from nearest margin which was the posterior margin. 3 sentinel nodes were negative for metastatic disease. Right medial margin also showed high-grade ductal carcinoma in situ the in situ carcinoma was present at the resection margin. The total tumor size was 1.5 cm. The tumor was ER positive PR negative. Postoperatively patient is doing well and is without significant complaints.Patient now on s/p re-excision with final pathology showing negative margins. On celexa for depression and feels better.  Otherwise feeling better. Daughter in the hospital recently. Recently laid off of her job.  Today patient denies any fevers chills night sweats headaches shortness of breath chest pains palpitations no myalgias or arthralgias. She does have a little bit of pain at the incision sites. There is no redness or erythema in the breast. Remainder of the 10 point review of systems is negative MEDICAL HISTORY: Past Medical History  Diagnosis Date  . PONV  (postoperative nausea and vomiting)   . Hyperlipidemia     takes Zocor daily  . Shortness of breath     with exertion  . Dizziness     occasionally  . Arthritis     back   . Chronic back pain   . Osteoporosis   . History of shingles 2011  . GERD (gastroesophageal reflux disease)     takes Nexium daily  . Gastric ulcer   . Hemorrhoids   . Hx of colonic polyps   . Diverticulosis   . Cancer     breast  . Urinary frequency   . Nocturia   . Hypothyroidism     takes Synthroid daily  . Cataract     right eye  . Anxiety     takes Ativan every 4hrs  . Anxiety 07/07/2011  . Hypertension     takes Bystolic and Losartan daily  . Asthma     uses inhaler prn  . Depression     has appt to discuss depression 07/26/2011  . Nausea alone     onset last Wednesday  08/04/11    ALLERGIES:  is allergic to hydrocodone; oxycodone hcl; ziac; and penicillins.  MEDICATIONS:  Current Outpatient Prescriptions  Medication Sig Dispense Refill  . albuterol (PROVENTIL HFA;VENTOLIN HFA) 108 (90 BASE) MCG/ACT inhaler Inhale 2 puffs into the lungs every 6 (six) hours as needed. For shortness of breath      . Calcium-Vitamin D-Vitamin K (VIACTIV PO) Take 1 tablet by mouth 2 (two) times daily. chewable      . citalopram (CELEXA) 20 MG tablet Take 20 mg by mouth daily.      Marland Kitchen esomeprazole (NEXIUM)  40 MG capsule Take 40 mg by mouth 2 (two) times daily.       . fish oil-omega-3 fatty acids 1000 MG capsule Take 2 g by mouth daily.       . hydrochlorothiazide (MICROZIDE) 12.5 MG capsule Take 12.5 mg by mouth daily.      . hydrocortisone cream 0.5 % Apply 1 application topically 2 (two) times daily as needed. For hemorrhoid pain      . levothyroxine (SYNTHROID, LEVOTHROID) 50 MCG tablet Take 50 mcg by mouth daily.      Marland Kitchen LORazepam (ATIVAN) 1 MG tablet Take 1 mg by mouth every 6 (six) hours as needed. For anxiety      . losartan (COZAAR) 100 MG tablet Take 100 mg by mouth daily.        . mupirocin ointment  (BACTROBAN) 2 % Apply 1 application topically 2 (two) times daily. EACH NOSTRIL FOR 5 DAYS. Last day was Aug 10 2011      . nebivolol (BYSTOLIC) 10 MG tablet Take 10 mg by mouth daily.        Marland Kitchen oxyCODONE-acetaminophen (ROXICET) 5-325 MG per tablet Take 1 to 2 percocet as needed for pain  25 tablet  0  . simvastatin (ZOCOR) 40 MG tablet Take 40 mg by mouth at bedtime.          SURGICAL HISTORY:  Past Surgical History  Procedure Date  . Abdominal hysterectomy 2001  . Hemorrhoid surgery   . Colonscopy   . Breast surgery 1972    left lumpectomy  . Breast lumpectomy 07/02/11    right breast and SNL    REVIEW OF SYSTEMS:  Pertinent items are noted in HPI.   PHYSICAL EXAMINATION: General appearance: alert, cooperative and appears stated age Head: Normocephalic, without obvious abnormality, atraumatic Neck: no adenopathy, no carotid bruit, no JVD, supple, symmetrical, trachea midline and thyroid not enlarged, symmetric, no tenderness/mass/nodules Lymph nodes: Cervical, supraclavicular, and axillary nodes normal. Resp: clear to auscultation bilaterally and normal percussion bilaterally Back: symmetric, no curvature. ROM normal. No CVA tenderness. Cardio: regular rate and rhythm, S1, S2 normal, no murmur, click, rub or gallop GI: soft, non-tender; bowel sounds normal; no masses,  no organomegaly Extremities: extremities normal, atraumatic, no cyanosis or edema Neurologic: Alert and oriented X 3, normal strength and tone. Normal symmetric reflexes. Normal coordination and gait  ECOG PERFORMANCE STATUS: 1 - Symptomatic but completely ambulatory  Blood pressure 144/82, pulse 61, temperature 98.3 F (36.8 C), temperature source Oral, height 5\' 2"  (1.575 m), weight 150 lb 8 oz (68.266 kg).  LABORATORY DATA: Lab Results  Component Value Date   WBC 6.8 08/18/2011   HGB 12.0 08/18/2011   HCT 36.2 08/18/2011   MCV 89.0 08/18/2011   PLT 329 08/18/2011      Chemistry      Component Value  Date/Time   NA 141 08/04/2011 1057   K 4.0 08/04/2011 1057   CL 102 08/04/2011 1057   CO2 29 08/04/2011 1057   BUN 12 08/04/2011 1057   CREATININE 1.18* 08/04/2011 1057      Component Value Date/Time   CALCIUM 10.0 08/04/2011 1057   ALKPHOS 57 06/16/2011 0828   AST 18 06/16/2011 0828   ALT 14 06/16/2011 0828   BILITOT 0.5 06/16/2011 0828    FINAL DIAGNOSIS Diagnosis Breast, excision, right, posterior medial margin - BENIGN BREAST WITH FIBROCYSTIC CHANGE - PREVIOUS SURGICAL SITE CHANGES. - NEGATIVE FOR ATYPIA OR MALIGNANCY. - SEE COMMENT. Microscopic Comment The surgical resection  margin(s) of the specimen were inked and microscopically evaluated. (CR:jy) 08/12/11 [place] RUND DO Pathologist, Electronic Signature   RADIOGRAPHIC STUDIES: None  ASSESSMENT: 71 year old female with:  1.  high-grade ductal carcinoma in situ of the right breast status post lumpectomy with sentinel node biopsy. The final pathology revealed a 1.5 cm DCIS that is ER positive PR negative. S/P re-excisioPostoperatively she is doing welln  2. To see Dr. Michell Heinrich for radiation  PLAN:  1. Post op doing well and is doing well 2. Refer to Radiation for post-op radiation. She will see Dr. Jabier Gauss 3. Follow up with me after completion of RT   All questions were answered. The patient knows to call the clinic with any problems, questions or concerns. We can certainly see the patient much sooner if necessary.  I spent 25 minutes counseling the patient face to face. The total time spent in the appointment was 30 minutes.    Drue Second, MD Medical/Oncology [facility] [phone] (beeper) [phone] (Office)  08/18/2011, 9:24 AM

## 2011-08-18 NOTE — Telephone Encounter (Signed)
gave patient appointment to see dr.wentworth on 08-19-2011 starting at 3:30pm gave patient appointment for 11-2011 printed out calendar and gave to the patient

## 2011-08-19 ENCOUNTER — Ambulatory Visit
Admission: RE | Admit: 2011-08-19 | Discharge: 2011-08-19 | Disposition: A | Discharge status: Home / Self Care | Payer: Medicare Other | Source: Ambulatory Visit | Attending: Radiation Oncology | Admitting: Radiation Oncology

## 2011-08-19 ENCOUNTER — Encounter: Payer: Self-pay | Admitting: Radiation Oncology

## 2011-08-19 DIAGNOSIS — D0590 Unspecified type of carcinoma in situ of unspecified breast: Secondary | ICD-10-CM | POA: Diagnosis not present

## 2011-08-19 DIAGNOSIS — C50419 Malignant neoplasm of upper-outer quadrant of unspecified female breast: Secondary | ICD-10-CM

## 2011-08-19 DIAGNOSIS — Z800 Family history of malignant neoplasm of digestive organs: Secondary | ICD-10-CM | POA: Diagnosis not present

## 2011-08-19 DIAGNOSIS — E039 Hypothyroidism, unspecified: Secondary | ICD-10-CM | POA: Diagnosis not present

## 2011-08-19 DIAGNOSIS — Z79899 Other long term (current) drug therapy: Secondary | ICD-10-CM | POA: Diagnosis not present

## 2011-08-19 DIAGNOSIS — Z170 Estrogen receptor positive status [ER+]: Secondary | ICD-10-CM | POA: Diagnosis not present

## 2011-08-19 NOTE — Progress Notes (Signed)
HERE TODAY FOR CONSULT RIGHT BREAST.   HAS APPOINTMENT WITH SURGEON 2/18, HE WILL REMOVE STERISTRIPS.  HAS QUESTIONS REGARDING THE SHOOTING PAINS AT SURGICAL SITE.  I EXPLAINED TO HER THAT IT WAS THE NERVE ENDINGS HEALING.  SHE IS VERY ANXIOUS

## 2011-08-23 ENCOUNTER — Ambulatory Visit (INDEPENDENT_AMBULATORY_CARE_PROVIDER_SITE_OTHER): Payer: Medicare Other | Admitting: Surgery

## 2011-08-23 ENCOUNTER — Encounter (INDEPENDENT_AMBULATORY_CARE_PROVIDER_SITE_OTHER): Payer: Self-pay | Admitting: Surgery

## 2011-08-23 VITALS — BP 124/82 | HR 76 | Temp 98.4°F | Resp 16 | Ht 62.0 in | Wt 148.8 lb

## 2011-08-23 DIAGNOSIS — Z09 Encounter for follow-up examination after completed treatment for conditions other than malignant neoplasm: Secondary | ICD-10-CM

## 2011-08-23 NOTE — Progress Notes (Signed)
Subjective:     Patient ID: Jane Wilkerson, female   DOB: 1940-08-25, 71 y.o.   MRN: [id number]  HPI She is doing well since reexcision. She has no complaints today. She is tolerating her oncologist  Review of Systems     Objective:   Physical Exam    On exam, the incision is healing very well. I removed her Steri-Strips. We again discussed the pathology. Assessment:     Patient doing well status post reexcision of right breast cancer with negative margins    Plan:     She will be starting radiation soon. I will see her back in 6 months.

## 2011-08-24 NOTE — Progress Notes (Signed)
CC:   Jane Wilkerson, M.D. Jane Wilkerson, M.D.  DIAGNOSIS:  Ductal carcinoma in situ of the right breast.  PREVIOUS INTERVENTIONS: 1. Right lumpectomy and sentinel lymph node biopsy on 07/02/2011,     revealing high-grade ductal carcinoma in situ with close margins     and 0 out of 3 lymph nodes positive. 2. Re-excision with no residual disease on 08/11/2011.  INTERVAL HISTORY:  Jane Wilkerson reports for followup today.  She did well following her surgery.  She had some pain and unfortunately a positive margin, so she underwent re-excision by Dr. Magnus Ivan and has no residual disease.  She is feeling well and doing well.  She has minimal discomfort.  She has a followup appointment with Dr. Magnus Ivan on Monday. She is stressed about her children and a son who is living with her, as well as a grandson and her husband who accompanies her today.  She met with Dr. Welton Flakes, who recommended adjuvant antiestrogen therapy after she completes radiation.  PHYSICAL EXAMINATION:  She has a healing incision over the right breast. There are Steri-Strips in place.  No erythema or signs of infection.  IMPRESSION:  Ductal carcinoma in situ of the right breast, status post lumpectomy.  RECOMMENDATIONS:  Jane Wilkerson is healing up nicely.  I believe she would benefit from postoperative radiation in terms of local control.  We discussed 30 to 33 treatments as an outpatient.  She would like to be treated in [place].  I have consented her for skin changes and lung damage. We discussed 30 to 33 treatments as an outpatient.  I will have our schedulers in [place] get in contact with her.    ______________________________ Lurline Hare, M.D. SW/MEDQ  D:  08/19/2011  T:  08/24/2011  Job:  [id number]

## 2011-08-27 DIAGNOSIS — D0590 Unspecified type of carcinoma in situ of unspecified breast: Secondary | ICD-10-CM | POA: Diagnosis not present

## 2011-08-27 DIAGNOSIS — Z510 Encounter for antineoplastic radiation therapy: Secondary | ICD-10-CM | POA: Diagnosis not present

## 2011-08-27 DIAGNOSIS — C50219 Malignant neoplasm of upper-inner quadrant of unspecified female breast: Secondary | ICD-10-CM | POA: Diagnosis not present

## 2011-08-30 DIAGNOSIS — C50219 Malignant neoplasm of upper-inner quadrant of unspecified female breast: Secondary | ICD-10-CM | POA: Diagnosis not present

## 2011-08-30 DIAGNOSIS — D0590 Unspecified type of carcinoma in situ of unspecified breast: Secondary | ICD-10-CM | POA: Diagnosis not present

## 2011-08-30 DIAGNOSIS — Z510 Encounter for antineoplastic radiation therapy: Secondary | ICD-10-CM | POA: Diagnosis not present

## 2011-08-31 DIAGNOSIS — D0590 Unspecified type of carcinoma in situ of unspecified breast: Secondary | ICD-10-CM | POA: Diagnosis not present

## 2011-08-31 DIAGNOSIS — Z510 Encounter for antineoplastic radiation therapy: Secondary | ICD-10-CM | POA: Diagnosis not present

## 2011-09-06 DIAGNOSIS — L259 Unspecified contact dermatitis, unspecified cause: Secondary | ICD-10-CM | POA: Diagnosis not present

## 2011-09-06 DIAGNOSIS — C50219 Malignant neoplasm of upper-inner quadrant of unspecified female breast: Secondary | ICD-10-CM | POA: Diagnosis not present

## 2011-09-06 DIAGNOSIS — Z510 Encounter for antineoplastic radiation therapy: Secondary | ICD-10-CM | POA: Diagnosis not present

## 2011-09-07 ENCOUNTER — Encounter: Payer: Self-pay | Admitting: *Deleted

## 2011-09-07 DIAGNOSIS — C50219 Malignant neoplasm of upper-inner quadrant of unspecified female breast: Secondary | ICD-10-CM | POA: Diagnosis not present

## 2011-09-07 NOTE — Progress Notes (Signed)
[facility] Psychosocial Distress Screening Clinical Social Work  Clinical Social Work was referred by distress screening protocol.  The patient scored a 8 on the Psychosocial Distress Thermometer which indicates severe distress. Clinical Social Worker spoke with patient to assess for distress and other psychosocial needs. The patient states she begins her radiation treatment tomorrow in [place] and is quite anxious. We discussed her fear of the unknown and CSW encouraged patient to call her after several treatments if she still exhibits the same level of anxiety.  The patient states this cancer journey has been very difficult for her because her family is very demanding and she is unable to focus on her health and recovery. The patient supports her daughter in caring for her granddaughter.  The patient's adult son also lives with her but does not provide a high level of support. Jane Wilkerson identifies her spouse as her only support at this time but worries that "this is too much on him". The patient states she is on Celexa but is unsure if it is working. CSW encouraged patient to continue taking it and then discuss with her MD at her next appointment. We discussed counseling as an option to help patient identify appropriate coping skills.  CSW encouraged patient to contact her if she would like to pursue counseling. Patient was very appreciative of phone call.   Clinical Social Worker follow up needed: no  If yes, follow up plan:  Jane Wilkerson, MSW, [facility] Clinical Social Worker [facility] [phone]

## 2011-09-14 DIAGNOSIS — C50219 Malignant neoplasm of upper-inner quadrant of unspecified female breast: Secondary | ICD-10-CM | POA: Diagnosis not present

## 2011-09-21 DIAGNOSIS — C50219 Malignant neoplasm of upper-inner quadrant of unspecified female breast: Secondary | ICD-10-CM | POA: Diagnosis not present

## 2011-09-28 DIAGNOSIS — C50219 Malignant neoplasm of upper-inner quadrant of unspecified female breast: Secondary | ICD-10-CM | POA: Diagnosis not present

## 2011-10-04 DIAGNOSIS — C50219 Malignant neoplasm of upper-inner quadrant of unspecified female breast: Secondary | ICD-10-CM | POA: Diagnosis not present

## 2011-10-04 DIAGNOSIS — Z510 Encounter for antineoplastic radiation therapy: Secondary | ICD-10-CM | POA: Diagnosis not present

## 2011-10-05 DIAGNOSIS — C50219 Malignant neoplasm of upper-inner quadrant of unspecified female breast: Secondary | ICD-10-CM | POA: Diagnosis not present

## 2011-10-05 DIAGNOSIS — Z510 Encounter for antineoplastic radiation therapy: Secondary | ICD-10-CM | POA: Diagnosis not present

## 2011-10-06 DIAGNOSIS — Z510 Encounter for antineoplastic radiation therapy: Secondary | ICD-10-CM | POA: Diagnosis not present

## 2011-10-06 DIAGNOSIS — C50219 Malignant neoplasm of upper-inner quadrant of unspecified female breast: Secondary | ICD-10-CM | POA: Diagnosis not present

## 2011-10-07 DIAGNOSIS — Z510 Encounter for antineoplastic radiation therapy: Secondary | ICD-10-CM | POA: Diagnosis not present

## 2011-10-07 DIAGNOSIS — C50219 Malignant neoplasm of upper-inner quadrant of unspecified female breast: Secondary | ICD-10-CM | POA: Diagnosis not present

## 2011-10-08 DIAGNOSIS — Z510 Encounter for antineoplastic radiation therapy: Secondary | ICD-10-CM | POA: Diagnosis not present

## 2011-10-08 DIAGNOSIS — C50219 Malignant neoplasm of upper-inner quadrant of unspecified female breast: Secondary | ICD-10-CM | POA: Diagnosis not present

## 2011-10-11 DIAGNOSIS — Z510 Encounter for antineoplastic radiation therapy: Secondary | ICD-10-CM | POA: Diagnosis not present

## 2011-10-11 DIAGNOSIS — C50219 Malignant neoplasm of upper-inner quadrant of unspecified female breast: Secondary | ICD-10-CM | POA: Diagnosis not present

## 2011-10-12 DIAGNOSIS — Z510 Encounter for antineoplastic radiation therapy: Secondary | ICD-10-CM | POA: Diagnosis not present

## 2011-10-12 DIAGNOSIS — C50219 Malignant neoplasm of upper-inner quadrant of unspecified female breast: Secondary | ICD-10-CM | POA: Diagnosis not present

## 2011-10-13 DIAGNOSIS — Z510 Encounter for antineoplastic radiation therapy: Secondary | ICD-10-CM | POA: Diagnosis not present

## 2011-10-13 DIAGNOSIS — C50219 Malignant neoplasm of upper-inner quadrant of unspecified female breast: Secondary | ICD-10-CM | POA: Diagnosis not present

## 2011-10-14 DIAGNOSIS — C50219 Malignant neoplasm of upper-inner quadrant of unspecified female breast: Secondary | ICD-10-CM | POA: Diagnosis not present

## 2011-10-14 DIAGNOSIS — Z510 Encounter for antineoplastic radiation therapy: Secondary | ICD-10-CM | POA: Diagnosis not present

## 2011-10-15 DIAGNOSIS — Z510 Encounter for antineoplastic radiation therapy: Secondary | ICD-10-CM | POA: Diagnosis not present

## 2011-10-15 DIAGNOSIS — C50219 Malignant neoplasm of upper-inner quadrant of unspecified female breast: Secondary | ICD-10-CM | POA: Diagnosis not present

## 2011-10-18 DIAGNOSIS — Z510 Encounter for antineoplastic radiation therapy: Secondary | ICD-10-CM | POA: Diagnosis not present

## 2011-10-18 DIAGNOSIS — C50219 Malignant neoplasm of upper-inner quadrant of unspecified female breast: Secondary | ICD-10-CM | POA: Diagnosis not present

## 2011-10-19 DIAGNOSIS — C50219 Malignant neoplasm of upper-inner quadrant of unspecified female breast: Secondary | ICD-10-CM | POA: Diagnosis not present

## 2011-10-19 DIAGNOSIS — Z510 Encounter for antineoplastic radiation therapy: Secondary | ICD-10-CM | POA: Diagnosis not present

## 2011-10-20 DIAGNOSIS — C50219 Malignant neoplasm of upper-inner quadrant of unspecified female breast: Secondary | ICD-10-CM | POA: Diagnosis not present

## 2011-10-20 DIAGNOSIS — Z510 Encounter for antineoplastic radiation therapy: Secondary | ICD-10-CM | POA: Diagnosis not present

## 2011-10-21 DIAGNOSIS — C50219 Malignant neoplasm of upper-inner quadrant of unspecified female breast: Secondary | ICD-10-CM | POA: Diagnosis not present

## 2011-10-21 DIAGNOSIS — Z510 Encounter for antineoplastic radiation therapy: Secondary | ICD-10-CM | POA: Diagnosis not present

## 2011-10-22 DIAGNOSIS — Z510 Encounter for antineoplastic radiation therapy: Secondary | ICD-10-CM | POA: Diagnosis not present

## 2011-10-22 DIAGNOSIS — C50219 Malignant neoplasm of upper-inner quadrant of unspecified female breast: Secondary | ICD-10-CM | POA: Diagnosis not present

## 2011-11-18 ENCOUNTER — Other Ambulatory Visit: Payer: Self-pay | Admitting: Medical Oncology

## 2011-11-18 DIAGNOSIS — C50419 Malignant neoplasm of upper-outer quadrant of unspecified female breast: Secondary | ICD-10-CM

## 2011-11-19 ENCOUNTER — Other Ambulatory Visit (HOSPITAL_BASED_OUTPATIENT_CLINIC_OR_DEPARTMENT_OTHER): Payer: Medicare Other | Admitting: Lab

## 2011-11-19 ENCOUNTER — Telehealth: Payer: Self-pay | Admitting: *Deleted

## 2011-11-19 ENCOUNTER — Encounter: Payer: Self-pay | Admitting: Oncology

## 2011-11-19 ENCOUNTER — Ambulatory Visit (HOSPITAL_BASED_OUTPATIENT_CLINIC_OR_DEPARTMENT_OTHER): Payer: Medicare Other | Admitting: Oncology

## 2011-11-19 VITALS — BP 151/86 | HR 66 | Temp 98.6°F | Ht 62.0 in | Wt 150.8 lb

## 2011-11-19 DIAGNOSIS — D0590 Unspecified type of carcinoma in situ of unspecified breast: Secondary | ICD-10-CM

## 2011-11-19 DIAGNOSIS — D0510 Intraductal carcinoma in situ of unspecified breast: Secondary | ICD-10-CM

## 2011-11-19 DIAGNOSIS — C50419 Malignant neoplasm of upper-outer quadrant of unspecified female breast: Secondary | ICD-10-CM

## 2011-11-19 DIAGNOSIS — Z170 Estrogen receptor positive status [ER+]: Secondary | ICD-10-CM | POA: Diagnosis not present

## 2011-11-19 LAB — CBC WITH DIFFERENTIAL/PLATELET
BASO%: 0.5 % (ref 0.0–2.0)
Basophils Absolute: 0 10*3/uL (ref 0.0–0.1)
HCT: 36.5 % (ref 34.8–46.6)
LYMPH%: 11.8 % — ABNORMAL LOW (ref 14.0–49.7)
MCH: 29.6 pg (ref 25.1–34.0)
MCHC: 33.3 g/dL (ref 31.5–36.0)
MONO#: 0.8 10*3/uL (ref 0.1–0.9)
NEUT%: 72.4 % (ref 38.4–76.8)
Platelets: 268 10*3/uL (ref 145–400)
WBC: 6.1 10*3/uL (ref 3.9–10.3)

## 2011-11-19 LAB — COMPREHENSIVE METABOLIC PANEL
AST: 16 U/L (ref 0–37)
BUN: 13 mg/dL (ref 6–23)
Calcium: 9.4 mg/dL (ref 8.4–10.5)
Chloride: 99 mEq/L (ref 96–112)
Creatinine, Ser: 1.2 mg/dL — ABNORMAL HIGH (ref 0.50–1.10)
Glucose, Bld: 93 mg/dL (ref 70–99)

## 2011-11-19 MED ORDER — EXEMESTANE 25 MG PO TABS: 25.0000 mg | ORAL_TABLET | Freq: Every day | ORAL | Status: AC

## 2011-11-19 NOTE — Patient Instructions (Signed)
1. Begin Aromasin 25 mg daily, we discussed the risks and benefits., Your prescription was sent to your pharmacy.  2. I will see you back in 3 months.  Exemestane tablets What is this medicine? EXEMESTANE (ex e MES tane) blocks the production of the hormone estrogen. Some types of breast cancer depend on estrogen to grow, and this medicine can stop tumor growth by blocking estrogen production. This medicine is for the treatment of breast cancer in postmenopausal women only. This medicine may be used for other purposes; ask your health care provider or pharmacist if you have questions. What should I tell my health care provider before I take this medicine? They need to know if you have any of these conditions: -an unusual or allergic reaction to exemestane, other medicines, foods, dyes, or preservatives -pregnant or trying to get pregnant -breast-feeding How should I use this medicine? Take this medicine by mouth with a glass of water. Follow the directions on the prescription label. Take your doses at regular intervals after a meal. Do not take your medicine more often than directed. Do not stop taking except on the advice of your doctor or health care professional. Contact your pediatrician regarding the use of this medicine in children. Special care may be needed. Overdosage: If you think you have taken too much of this medicine contact a poison control center or emergency room at once. NOTE: This medicine is only for you. Do not share this medicine with others. What if I miss a dose? If you miss a dose, take the next dose as usual. Do not try to make up the missed dose. Do not take double or extra doses. What may interact with this medicine? Do not take this medicine with any of the following medications: -female hormones, like estrogens and birth control pills This medicine may also interact with the following medications: -androstenedione -phenytoin -rifabutin, rifampin, or  rifapentine -St. John's Wort This list may not describe all possible interactions. Give your health care provider a list of all the medicines, herbs, non-prescription drugs, or dietary supplements you use. Also tell them if you smoke, drink alcohol, or use illegal drugs. Some items may interact with your medicine. What should I watch for while using this medicine? Visit your doctor or health care professional for regular checks on your progress. If you experience hot flashes or sweating while taking this medicine, avoid alcohol, smoking and drinks with caffeine. This may help to decrease these side effects. What side effects may I notice from receiving this medicine? Side effects that you should report to your doctor or health care professional as soon as possible: -any new or unusual symptoms -changes in vision -fever -leg or arm swelling -pain in bones, joints, or muscles -pain in hips, back, ribs, arms, shoulders, or legs Side effects that usually do not require medical attention (report to your doctor or health care professional if they continue or are bothersome): -difficulty sleeping -headache -hot flashes -sweating -unusually weak or tired This list may not describe all possible side effects. Call your doctor for medical advice about side effects. You may report side effects to FDA at 1-800-FDA-1088. Where should I keep my medicine? Keep out of the reach of children. Store at room temperature between 15 and 30 degrees C (59 and 86 degrees F). Throw away any unused medicine after the expiration date. NOTE: This sheet is a summary. It may not cover all possible information. If you have questions about this medicine, talk to your doctor,  pharmacist, or health care provider.  2012, [employer] Standard. (10/24/2007 11:48:29 AM)

## 2011-11-19 NOTE — Telephone Encounter (Signed)
gave patient appointment for 03-09-2012 starting at 9:30am with labs printed out calendar and gave to the patient

## 2011-11-19 NOTE — Progress Notes (Signed)
OFFICE PROGRESS NOTE  CC  Cassell Smiles., MD, MD [address] [place] [address]  DIAGNOSIS: 71 year old female with ductal carcinoma in situ of the right breast status post lumpectomy performed on 07/02/2011.  PRIOR THERAPY:  #1 patient is status post lumpectomy of the right breast done on 07/02/2011. The final pathology revealed a ductal carcinoma in situ. The tumor was ER +100% PR 0%.  #2 Patient is s/p re-excision of the margins anre final pathology is negative for any residual carcinoma.  #3 patient is status post radiation therapy to the right breast. All of her therapy was given as an [facility]. She received a total of 30-33 treatments.  #4 patient will begin Aromasin 25 mg daily starting on 11/19/2011  CURRENT THERAPY:Aromasin 25 mg daily  INTERVAL HISTORY: 71 year old female seen in followup today.overall patient is doing well.she tolerated her radiation well without any significant complaints. She has healed up very nicely. She denies any fevers chills night sweats headaches shortness of breath chest pains palpitations no myalgias or arthralgias no vaginal discharge. No double vision blurring of vision. Remainder of the 10 point review of systems is negative.  MEDICAL HISTORY: Past Medical History  Diagnosis Date  . PONV (postoperative nausea and vomiting)   . Hyperlipidemia     takes Zocor daily  . Shortness of breath     with exertion  . Dizziness     occasionally  . Arthritis     back   . Chronic back pain   . Osteoporosis   . History of shingles 2011  . GERD (gastroesophageal reflux disease)     takes Nexium daily  . Gastric ulcer   . Hemorrhoids   . Hx of colonic polyps   . Diverticulosis   . Urinary frequency   . Nocturia   . Hypothyroidism     takes Synthroid daily  . Cataract     right eye  . Anxiety     takes Ativan every 4hrs  . Anxiety 07/07/2011  . Hypertension     takes  Bystolic and Losartan daily  . Asthma     uses inhaler prn  . Depression     has appt to discuss depression 07/26/2011  . Nausea alone     onset last Wednesday  08/04/11  . Cancer     rt breast    ALLERGIES:  is allergic to hydrocodone; oxycodone hcl; ziac; and penicillins.  MEDICATIONS:  Current Outpatient Prescriptions  Medication Sig Dispense Refill  . albuterol (PROVENTIL HFA;VENTOLIN HFA) 108 (90 BASE) MCG/ACT inhaler Inhale 2 puffs into the lungs every 6 (six) hours as needed. For shortness of breath      . Calcium-Vitamin D-Vitamin K (VIACTIV PO) Take 1 tablet by mouth 2 (two) times daily. chewable      . citalopram (CELEXA) 20 MG tablet Take 20 mg by mouth daily.      Marland Kitchen esomeprazole (NEXIUM) 40 MG capsule Take 40 mg by mouth 2 (two) times daily.       . fish oil-omega-3 fatty acids 1000 MG capsule Take 2 g by mouth daily.       . hydrochlorothiazide (MICROZIDE) 12.5 MG capsule Take 12.5 mg by mouth daily.      . hydrocortisone cream 0.5 % Apply 1 application topically 2 (two) times daily as needed. For hemorrhoid pain      . levothyroxine (SYNTHROID, LEVOTHROID) 50 MCG tablet Take 50 mcg by mouth daily.      Marland Kitchen  LORazepam (ATIVAN) 1 MG tablet Take 1 mg by mouth every 6 (six) hours as needed. For anxiety      . losartan (COZAAR) 100 MG tablet Take 100 mg by mouth daily.        . nebivolol (BYSTOLIC) 10 MG tablet Take 10 mg by mouth daily.        . simvastatin (ZOCOR) 40 MG tablet Take 40 mg by mouth at bedtime.        . mupirocin ointment (BACTROBAN) 2 % Apply 1 application topically 2 (two) times daily. EACH NOSTRIL FOR 5 DAYS. Last day was Aug 10 2011        SURGICAL HISTORY:  Past Surgical History  Procedure Date  . Abdominal hysterectomy 2001  . Hemorrhoid surgery   . Colonscopy   . Breast surgery 1972    left lumpectomy  . Breast lumpectomy 07/02/11    right breast and SNL  . Breast surgery 08/11/11    margins on rt breast    REVIEW OF SYSTEMS:  Pertinent items are  noted in HPI.   PHYSICAL EXAMINATION: General appearance: alert, cooperative and appears stated age Head: Normocephalic, without obvious abnormality, atraumatic Neck: no adenopathy, no carotid bruit, no JVD, supple, symmetrical, trachea midline and thyroid not enlarged, symmetric, no tenderness/mass/nodules Lymph nodes: Cervical, supraclavicular, and axillary nodes normal. Resp: clear to auscultation bilaterally and normal percussion bilaterally Back: symmetric, no curvature. ROM normal. No CVA tenderness. Cardio: regular rate and rhythm, S1, S2 normal, no murmur, click, rub or gallop GI: soft, non-tender; bowel sounds normal; no masses,  no organomegaly Extremities: extremities normal, atraumatic, no cyanosis or edema Neurologic: Alert and oriented X 3, normal strength and tone. Normal symmetric reflexes. Normal coordination and gait Bilateral breast examination is performed right breast reveals a well-healed incision over the right breast no masses no nipple discharge there is some darkening of the skin. Left breast no masses or nipple discharge the  ECOG PERFORMANCE STATUS: 1 - Symptomatic but completely ambulatory  Blood pressure 151/86, pulse 66, temperature 98.6 F (37 C), temperature source Oral, height 5\' 2"  (1.575 m), weight 150 lb 12.8 oz (68.402 kg).  LABORATORY DATA: Lab Results  Component Value Date   WBC 6.1 11/19/2011   HGB 12.2 11/19/2011   HCT 36.5 11/19/2011   MCV 88.8 11/19/2011   PLT 268 11/19/2011      Chemistry      Component Value Date/Time   NA 137 08/18/2011 0820   K 4.0 08/18/2011 0820   CL 98 08/18/2011 0820   CO2 30 08/18/2011 0820   BUN 14 08/18/2011 0820   CREATININE 1.23* 08/18/2011 0820      Component Value Date/Time   CALCIUM 9.3 08/18/2011 0820   ALKPHOS 57 06/16/2011 0828   AST 18 06/16/2011 0828   ALT 14 06/16/2011 0828   BILITOT 0.5 06/16/2011 0828    FINAL DIAGNOSIS Diagnosis Breast, excision, right, posterior medial margin - BENIGN BREAST  WITH FIBROCYSTIC CHANGE - PREVIOUS SURGICAL SITE CHANGES. - NEGATIVE FOR ATYPIA OR MALIGNANCY. - SEE COMMENT. Microscopic Comment The surgical resection margin(s) of the specimen were inked and microscopically evaluated. (CR:jy) 08/12/11 [place] RUND DO Pathologist, Electronic Signature   RADIOGRAPHIC STUDIES: None  ASSESSMENT: 71 year old female with:  1.  high-grade ductal carcinoma in situ of the right breast status post lumpectomy with sentinel node biopsy. The final pathology revealed a 1.5 cm DCIS that is ER positive PR negative.   #2 patient is now status post radiation therapy to the  right breast for high-grade DCIS. Overall she tolerated it well.  #3 because the tumor was estrogen receptor positive she will begin Aromasin 25 mg daily as a chemopreventive therapy for the ipsilateral as well as contralateral breast. Risks and benefits of treatment we'll have been discussed with the patient very clearly and literature was given to her.  PLAN:  #1 patient will proceed with Aromasin 25 mg on a daily basis. A total of 5 years of therapy is planned.  #2 prescription to her pharmacy was sent today.  #3 I will plan on seeing her back in about 3 months time in followup. Of course she knows to call me sooner if need arises.  All questions were answered. The patient knows to call the clinic with any problems, questions or concerns. We can certainly see the patient much sooner if necessary.  I spent 30 minutes counseling the patient face to face. The total time spent in the appointment was 30 minutes.    Drue Second, MD Medical/Oncology [facility] [phone] (beeper) [phone] (Office)  11/19/2011, 9:32 AM

## 2011-11-19 NOTE — Telephone Encounter (Signed)
gave patient appointment for 03-09-2012  starting at 9:30am printed out calendar and gave to the patient

## 2011-12-03 DIAGNOSIS — D0590 Unspecified type of carcinoma in situ of unspecified breast: Secondary | ICD-10-CM | POA: Diagnosis not present

## 2011-12-03 DIAGNOSIS — E785 Hyperlipidemia, unspecified: Secondary | ICD-10-CM | POA: Diagnosis not present

## 2011-12-03 DIAGNOSIS — Z79899 Other long term (current) drug therapy: Secondary | ICD-10-CM | POA: Diagnosis not present

## 2011-12-03 DIAGNOSIS — E039 Hypothyroidism, unspecified: Secondary | ICD-10-CM | POA: Diagnosis not present

## 2011-12-09 DIAGNOSIS — R5383 Other fatigue: Secondary | ICD-10-CM | POA: Diagnosis not present

## 2011-12-09 DIAGNOSIS — E785 Hyperlipidemia, unspecified: Secondary | ICD-10-CM | POA: Diagnosis not present

## 2011-12-09 DIAGNOSIS — Z6827 Body mass index (BMI) 27.0-27.9, adult: Secondary | ICD-10-CM | POA: Diagnosis not present

## 2011-12-09 DIAGNOSIS — R5381 Other malaise: Secondary | ICD-10-CM | POA: Diagnosis not present

## 2011-12-09 DIAGNOSIS — I10 Essential (primary) hypertension: Secondary | ICD-10-CM | POA: Diagnosis not present

## 2011-12-14 ENCOUNTER — Encounter (INDEPENDENT_AMBULATORY_CARE_PROVIDER_SITE_OTHER): Payer: Self-pay | Admitting: Surgery

## 2012-02-22 ENCOUNTER — Ambulatory Visit (INDEPENDENT_AMBULATORY_CARE_PROVIDER_SITE_OTHER): Payer: Medicare Other | Admitting: Surgery

## 2012-02-22 ENCOUNTER — Encounter (INDEPENDENT_AMBULATORY_CARE_PROVIDER_SITE_OTHER): Payer: Self-pay | Admitting: Surgery

## 2012-02-22 VITALS — BP 144/86 | HR 82 | Temp 98.6°F | Resp 16 | Ht 62.0 in | Wt 154.0 lb

## 2012-02-22 DIAGNOSIS — Z853 Personal history of malignant neoplasm of breast: Secondary | ICD-10-CM

## 2012-02-22 DIAGNOSIS — Z86000 Personal history of in-situ neoplasm of breast: Secondary | ICD-10-CM

## 2012-02-22 NOTE — Progress Notes (Signed)
Subjective:     Patient ID: Jane Wilkerson, female   DOB: [birth year], 71 y.o.   MRN: [id number]  HPI She is here for long-term followup of ductal carcinoma in situ of the right breast. She is status post right breast lumpectomy in December of 2012 followed by radiation to the right breast. She is currently on anti-hormonal therapy. She reports significant fatigue and nausea with the medication but this is improving. She has no problems regarding her breasts Review of Systems     Objective:   Physical Exam    On exam, there is no right axillary adenopathy. Her incision is well-healed. There is some mild fullness at the 12:00 position around the right areola Assessment:     Long-term followup ductal carcinoma in situ of the right breast    Plan:     She will be due for her mammograms in October. She continues to be followed by the oncologist. I will see her back in 6 months unless something changes

## 2012-03-09 ENCOUNTER — Ambulatory Visit (HOSPITAL_BASED_OUTPATIENT_CLINIC_OR_DEPARTMENT_OTHER): Payer: Medicare Other | Admitting: Oncology

## 2012-03-09 ENCOUNTER — Encounter: Payer: Self-pay | Admitting: Oncology

## 2012-03-09 ENCOUNTER — Other Ambulatory Visit (HOSPITAL_BASED_OUTPATIENT_CLINIC_OR_DEPARTMENT_OTHER): Payer: Medicare Other | Admitting: Lab

## 2012-03-09 VITALS — BP 128/82 | HR 68 | Temp 99.5°F | Resp 20 | Ht 62.0 in | Wt 153.8 lb

## 2012-03-09 DIAGNOSIS — D0510 Intraductal carcinoma in situ of unspecified breast: Secondary | ICD-10-CM

## 2012-03-09 DIAGNOSIS — D0590 Unspecified type of carcinoma in situ of unspecified breast: Secondary | ICD-10-CM

## 2012-03-09 DIAGNOSIS — Z170 Estrogen receptor positive status [ER+]: Secondary | ICD-10-CM

## 2012-03-09 LAB — COMPREHENSIVE METABOLIC PANEL (CC13)
Albumin: 3.9 g/dL (ref 3.5–5.0)
BUN: 9 mg/dL (ref 7.0–26.0)
CO2: 28 mEq/L (ref 22–29)
Calcium: 9.7 mg/dL (ref 8.4–10.4)
Chloride: 97 mEq/L — ABNORMAL LOW (ref 98–107)
Creatinine: 1.2 mg/dL — ABNORMAL HIGH (ref 0.6–1.1)
Glucose: 104 mg/dl — ABNORMAL HIGH (ref 70–99)
Potassium: 4.2 mEq/L (ref 3.5–5.1)

## 2012-03-09 LAB — CBC WITH DIFFERENTIAL/PLATELET
Basophils Absolute: 0 10*3/uL (ref 0.0–0.1)
Eosinophils Absolute: 0.2 10*3/uL (ref 0.0–0.5)
HCT: 37.6 % (ref 34.8–46.6)
HGB: 12.7 g/dL (ref 11.6–15.9)
MCH: 30.1 pg (ref 25.1–34.0)
MONO#: 0.9 10*3/uL (ref 0.1–0.9)
NEUT#: 4.9 10*3/uL (ref 1.5–6.5)
NEUT%: 72.4 % (ref 38.4–76.8)
WBC: 6.8 10*3/uL (ref 3.9–10.3)
lymph#: 0.8 10*3/uL — ABNORMAL LOW (ref 0.9–3.3)

## 2012-03-09 NOTE — Progress Notes (Signed)
OFFICE PROGRESS NOTE  CC  Cassell Smiles., MD [address] [place] [address]  DIAGNOSIS: 71 year old female with ductal carcinoma in situ of the right breast status post lumpectomy performed on 07/02/2011.  PRIOR THERAPY:  #1 patient is status post lumpectomy of the right breast done on 07/02/2011. The final pathology revealed a ductal carcinoma in situ. The tumor was ER +100% PR 0%.  #2 Patient is s/p re-excision of the margins anre final pathology is negative for any residual carcinoma.  #3 patient is status post radiation therapy to the right breast. All of her therapy was given as an [facility]. She received a total of 30-33 treatments.  #4 patient will begin Aromasin 25 mg daily starting on 11/19/2011  CURRENT THERAPY:Aromasin 25 mg daily  INTERVAL HISTORY: 71 year old female seen in followup today.She complains of significant problems from the Aromasin. She has aches pains she is not able to take properly. She does not have any nausea but she does complain of having abdominal discomfort. She has no peripheral paresthesias she has not noticed any vaginal discharge or bleeding. She does state that emotionally she is very labile since being on the Aromasin. She is very concerned about this. She is not able to focus on her tasks. All of these things began after she started Aromasin. She otherwise denies any fevers chills night sweats headaches no shortness of breath no chest pains or palpitations. Remainder of the 10 point review of systems is negative.  MEDICAL HISTORY: Past Medical History  Diagnosis Date  . PONV (postoperative nausea and vomiting)   . Hyperlipidemia     takes Zocor daily  . Shortness of breath     with exertion  . Dizziness     occasionally  . Arthritis     back   . Chronic back pain   . Osteoporosis   . History of shingles 2011  . GERD (gastroesophageal reflux disease)     takes  Nexium daily  . Gastric ulcer   . Hemorrhoids   . Hx of colonic polyps   . Diverticulosis   . Urinary frequency   . Nocturia   . Hypothyroidism     takes Synthroid daily  . Cataract     right eye  . Anxiety     takes Ativan every 4hrs  . Anxiety 07/07/2011  . Hypertension     takes Bystolic and Losartan daily  . Asthma     uses inhaler prn  . Depression     has appt to discuss depression 07/26/2011  . Nausea alone     onset last Wednesday  08/04/11  . Cancer     rt breast    ALLERGIES:  is allergic to hydrocodone; oxycodone hcl; ziac; and penicillins.  MEDICATIONS:  Current Outpatient Prescriptions  Medication Sig Dispense Refill  . albuterol (PROVENTIL HFA;VENTOLIN HFA) 108 (90 BASE) MCG/ACT inhaler Inhale 2 puffs into the lungs every 6 (six) hours as needed. For shortness of breath      . Calcium-Vitamin D-Vitamin K (VIACTIV PO) Take 1 tablet by mouth 2 (two) times daily. chewable      . citalopram (CELEXA) 20 MG tablet Take 20 mg by mouth daily.      Marland Kitchen esomeprazole (NEXIUM) 40 MG capsule Take 40 mg by mouth 2 (two) times daily.       Marland Kitchen exemestane (AROMASIN) 25 MG tablet Take 25 mg by mouth daily after breakfast.      .  fish oil-omega-3 fatty acids 1000 MG capsule Take 2 g by mouth daily.       . hydrochlorothiazide (MICROZIDE) 12.5 MG capsule Take 12.5 mg by mouth daily.      . hydrocortisone cream 0.5 % Apply 1 application topically 2 (two) times daily as needed. For hemorrhoid pain      . levothyroxine (SYNTHROID, LEVOTHROID) 50 MCG tablet Take 50 mcg by mouth daily.      Marland Kitchen LORazepam (ATIVAN) 1 MG tablet Take 1 mg by mouth every 6 (six) hours as needed. For anxiety      . losartan (COZAAR) 100 MG tablet Take 100 mg by mouth daily.        . mupirocin ointment (BACTROBAN) 2 % Apply 1 application topically 2 (two) times daily. EACH NOSTRIL FOR 5 DAYS. Last day was Aug 10 2011      . nebivolol (BYSTOLIC) 10 MG tablet Take 10 mg by mouth daily.        . simvastatin (ZOCOR) 40  MG tablet Take 40 mg by mouth at bedtime.          SURGICAL HISTORY:  Past Surgical History  Procedure Date  . Abdominal hysterectomy 2001  . Hemorrhoid surgery   . Colonscopy   . Breast surgery 1972    left lumpectomy  . Breast lumpectomy 07/02/11    right breast and SNL  . Breast surgery 08/11/11    margins on rt breast    REVIEW OF SYSTEMS:  Pertinent items are noted in HPI.   PHYSICAL EXAMINATION:  HEENT. Exam EOMI PERRLA sclerae anicteric no conjunctival pallor oral because is moist neck is supple lungs are clear bilaterally to auscultation cardiovascular is regular rate rhythm abdomen is soft nontender nondistended bowel sounds are present no HSM extremities no edema neuro patient's alert oriented otherwise nonfocal. Bilateral breast examination is performed right breast reveals a well-healed incision over the right breast no masses no nipple discharge there is some darkening of the skin. Left breast no masses or nipple discharge the  ECOG PERFORMANCE STATUS: 1 - Symptomatic but completely ambulatory  Blood pressure 128/82, pulse 68, temperature 99.5 F (37.5 C), temperature source Oral, resp. rate 20, height 5\' 2"  (1.575 m), weight 153 lb 12.8 oz (69.763 kg).  LABORATORY DATA: Lab Results  Component Value Date   WBC 6.8 03/09/2012   HGB 12.7 03/09/2012   HCT 37.6 03/09/2012   MCV 89.1 03/09/2012   PLT 281 03/09/2012      Chemistry      Component Value Date/Time   NA 137 03/09/2012 0934   NA 138 11/19/2011 0821   K 4.2 03/09/2012 0934   K 3.8 11/19/2011 0821   CL 97* 03/09/2012 0934   CL 99 11/19/2011 0821   CO2 28 03/09/2012 0934   CO2 31 11/19/2011 0821   BUN 9.0 03/09/2012 0934   BUN 13 11/19/2011 0821   CREATININE 1.2* 03/09/2012 0934   CREATININE 1.20* 11/19/2011 0821      Component Value Date/Time   CALCIUM 9.7 03/09/2012 0934   CALCIUM 9.4 11/19/2011 0821   ALKPHOS 65 03/09/2012 0934   ALKPHOS 60 11/19/2011 0821   AST 16 03/09/2012 0934   AST 16 11/19/2011 0821   ALT 16 03/09/2012  0934   ALT 12 11/19/2011 0821   BILITOT 0.40 03/09/2012 0934   BILITOT 0.4 11/19/2011 0821    FINAL DIAGNOSIS Diagnosis Breast, excision, right, posterior medial margin - BENIGN BREAST WITH FIBROCYSTIC CHANGE - PREVIOUS SURGICAL SITE CHANGES. -  NEGATIVE FOR ATYPIA OR MALIGNANCY. - SEE COMMENT. Microscopic Comment The surgical resection margin(s) of the specimen were inked and microscopically evaluated. (CR:jy) 08/12/11 [place] RUND DO Pathologist, Electronic Signature   RADIOGRAPHIC STUDIES: None  ASSESSMENT: 71 year old female with:  1.  high-grade ductal carcinoma in situ of the right breast status post lumpectomy with sentinel node biopsy. The final pathology revealed a 1.5 cm DCIS that is ER positive PR negative.   #2 patient is now status post radiation therapy to the right breast for high-grade DCIS. Overall she tolerated it well.  3 patient was begun on Aromasin 20 mg on a daily basis however she is having significant aches pains and problems with it. I have therefore instructed her to discontinue it for now. PLAN:  #1Patient with whole Aromasin for now because she is having significant problems with the.  #2 I have instructed her to call me in about one month's time to see how she is doing. If she is doing well then we will proceed with a different agent such as Arimidex.  #3 in the meantime patient knows to call me with any problems and not to wait for a month.  All questions were answered. The patient knows to call the clinic with any problems, questions or concerns. We can certainly see the patient much sooner if necessary.  I spent >15 minutes counseling the patient face to face. The total time spent in the appointment was 30 minutes.    Drue Second, MD Medical/Oncology [facility] [phone] (beeper) [phone] (Office)  03/09/2012, 10:46 AM

## 2012-03-09 NOTE — Patient Instructions (Addendum)
Hold aromasin  Call Dr. Milta Deiters office in 1 month to report how are you doing   I will see you back in December

## 2012-04-10 ENCOUNTER — Other Ambulatory Visit: Payer: Self-pay | Admitting: Emergency Medicine

## 2012-04-10 ENCOUNTER — Telehealth: Payer: Self-pay | Admitting: Oncology

## 2012-04-10 NOTE — Telephone Encounter (Signed)
S/w the pt and she is aware of her oct appts °

## 2012-04-11 ENCOUNTER — Telehealth: Payer: Self-pay | Admitting: *Deleted

## 2012-04-11 ENCOUNTER — Other Ambulatory Visit: Payer: Self-pay | Admitting: Oncology

## 2012-04-11 DIAGNOSIS — C50419 Malignant neoplasm of upper-outer quadrant of unspecified female breast: Secondary | ICD-10-CM

## 2012-04-11 MED ORDER — ANASTROZOLE 1 MG PO TABS: 1.0000 mg | ORAL_TABLET | Freq: Every day | ORAL | Status: AC

## 2012-04-11 NOTE — Telephone Encounter (Signed)
Notified pt Arimidex has been called into her pharmacy

## 2012-04-11 NOTE — Telephone Encounter (Signed)
Pt called [facility] states " I thought I was suppose to start the new medicine but I can't remember if I'm suppose to start it before I see Dr. Welton Flakes or after I see her." Pt stopped Aromasin 03/09/12 Returned pt's called. Per MD's last note 03/09/12 , pt  instructed  to call MD in about one month's time to see how she is doing. If she is doing well then we will proceed with a different agent such as Arimidex. Pt confirmed she is doing well.  Arimidex to be called into pt's pharmacy upon review with MD of above concerns

## 2012-04-11 NOTE — Telephone Encounter (Signed)
Let patient know that arimidex was called in to her pharmacy

## 2012-04-28 ENCOUNTER — Ambulatory Visit (HOSPITAL_BASED_OUTPATIENT_CLINIC_OR_DEPARTMENT_OTHER): Payer: Medicare Other | Admitting: Oncology

## 2012-04-28 ENCOUNTER — Telehealth: Payer: Self-pay | Admitting: *Deleted

## 2012-04-28 ENCOUNTER — Encounter: Payer: Self-pay | Admitting: Oncology

## 2012-04-28 ENCOUNTER — Other Ambulatory Visit (HOSPITAL_BASED_OUTPATIENT_CLINIC_OR_DEPARTMENT_OTHER): Payer: Medicare Other | Admitting: Lab

## 2012-04-28 VITALS — BP 138/89 | HR 69 | Temp 98.4°F | Resp 20 | Ht 62.0 in | Wt 153.4 lb

## 2012-04-28 DIAGNOSIS — D0590 Unspecified type of carcinoma in situ of unspecified breast: Secondary | ICD-10-CM

## 2012-04-28 DIAGNOSIS — Z170 Estrogen receptor positive status [ER+]: Secondary | ICD-10-CM

## 2012-04-28 DIAGNOSIS — D0510 Intraductal carcinoma in situ of unspecified breast: Secondary | ICD-10-CM

## 2012-04-28 LAB — CBC WITH DIFFERENTIAL/PLATELET
BASO%: 0.7 % (ref 0.0–2.0)
Basophils Absolute: 0.1 10*3/uL (ref 0.0–0.1)
EOS%: 2 % (ref 0.0–7.0)
HCT: 39.1 % (ref 34.8–46.6)
HGB: 12.8 g/dL (ref 11.6–15.9)
MCHC: 32.8 g/dL (ref 31.5–36.0)
MONO#: 1 10*3/uL — ABNORMAL HIGH (ref 0.1–0.9)
NEUT%: 68.5 % (ref 38.4–76.8)
RDW: 13.5 % (ref 11.2–14.5)
WBC: 7.8 10*3/uL (ref 3.9–10.3)
lymph#: 1.2 10*3/uL (ref 0.9–3.3)

## 2012-04-28 LAB — COMPREHENSIVE METABOLIC PANEL (CC13)
ALT: 17 U/L (ref 0–55)
AST: 19 U/L (ref 5–34)
Albumin: 4 g/dL (ref 3.5–5.0)
CO2: 28 mEq/L (ref 22–29)
Calcium: 10 mg/dL (ref 8.4–10.4)
Chloride: 96 mEq/L — ABNORMAL LOW (ref 98–107)
Creatinine: 1.1 mg/dL (ref 0.6–1.1)
Potassium: 4 mEq/L (ref 3.5–5.1)

## 2012-04-28 NOTE — Progress Notes (Signed)
OFFICE PROGRESS NOTE  CC  Cassell Smiles., MD [address] [place] [address]  DIAGNOSIS: 71 year old female with ductal carcinoma in situ of the right breast status post lumpectomy performed on 07/02/2011.  PRIOR THERAPY:  #1 patient is status post lumpectomy of the right breast done on 07/02/2011. The final pathology revealed a ductal carcinoma in situ. The tumor was ER +100% PR 0%.  #2 Patient is s/p re-excision of the margins anre final pathology is negative for any residual carcinoma.  #3 patient is status post radiation therapy to the right breast. All of her therapy was given as an outpatient at the [facility]. She received a total of 30-33 treatments.  #4 patient will begin Aromasin 25 mg daily starting on 05/17/2013But this was discontinued after 3 months.  #5 patient was then started on Arimidex 1 mg daily one month ago. She is thus far tolerating this  CURRENT THERAPY: Arimidex 1 mg daily  INTERVAL HISTORY: 71 year old female seen in followup today.Overall she is doing well she is now on Arimidex 1 mg. She seems to be tolerating this a lot better without any significant aches and pains myalgias arthralgias or mood swings. She is denying any hot flashes or vaginal bleeding or discharge.She has no panic attacks currently. No peripheral paresthesias no back pain no breast tenderness. Remainder of the 10 point review of systems is negative. MEDICAL HISTORY: Past Medical History  Diagnosis Date  . PONV (postoperative nausea and vomiting)   . Hyperlipidemia     takes Zocor daily  . Shortness of breath     with exertion  . Dizziness     occasionally  . Arthritis     back   . Chronic back pain   . Osteoporosis   . History of shingles 2011  . GERD (gastroesophageal reflux disease)     takes Nexium daily  . Gastric ulcer   . Hemorrhoids   . Hx of colonic polyps   . Diverticulosis   . Urinary frequency   . Nocturia     . Hypothyroidism     takes Synthroid daily  . Cataract     right eye  . Anxiety     takes Ativan every 4hrs  . Anxiety 07/07/2011  . Hypertension     takes Bystolic and Losartan daily  . Asthma     uses inhaler prn  . Depression     has appt to discuss depression 07/26/2011  . Nausea alone     onset last Wednesday  08/04/11  . Cancer     rt breast    ALLERGIES:  is allergic to hydrocodone; oxycodone hcl; ziac; and penicillins.  MEDICATIONS:  Current Outpatient Prescriptions  Medication Sig Dispense Refill  . albuterol (PROVENTIL HFA;VENTOLIN HFA) 108 (90 BASE) MCG/ACT inhaler Inhale 2 puffs into the lungs every 6 (six) hours as needed. For shortness of breath      . anastrozole (ARIMIDEX) 1 MG tablet Take 1 tablet (1 mg total) by mouth daily.  90 tablet  12  . Calcium-Vitamin D-Vitamin K (VIACTIV PO) Take 1 tablet by mouth 2 (two) times daily. chewable      . citalopram (CELEXA) 20 MG tablet Take 20 mg by mouth daily.      Marland Kitchen esomeprazole (NEXIUM) 40 MG capsule Take 40 mg by mouth 2 (two) times daily.       . fish oil-omega-3 fatty acids 1000 MG capsule Take 2 g by mouth daily.       Marland Kitchen  hydrochlorothiazide (MICROZIDE) 12.5 MG capsule Take 12.5 mg by mouth daily.      . hydrocortisone cream 0.5 % Apply 1 application topically 2 (two) times daily as needed. For hemorrhoid pain      . levothyroxine (SYNTHROID, LEVOTHROID) 50 MCG tablet Take 50 mcg by mouth daily.      Marland Kitchen LORazepam (ATIVAN) 1 MG tablet Take 1 mg by mouth every 6 (six) hours as needed. For anxiety      . losartan (COZAAR) 100 MG tablet Take 100 mg by mouth daily.        . mupirocin ointment (BACTROBAN) 2 % Apply 1 application topically 2 (two) times daily. EACH NOSTRIL FOR 5 DAYS. Last day was Aug 10 2011      . nebivolol (BYSTOLIC) 10 MG tablet Take 10 mg by mouth daily.        . simvastatin (ZOCOR) 40 MG tablet Take 40 mg by mouth at bedtime.          SURGICAL HISTORY:  Past Surgical History  Procedure Date  .  Abdominal hysterectomy 2001  . Hemorrhoid surgery   . Colonscopy   . Breast surgery 1972    left lumpectomy  . Breast lumpectomy 07/02/11    right breast and SNL  . Breast surgery 08/11/11    margins on rt breast    REVIEW OF SYSTEMS:  Pertinent items are noted in HPI.   PHYSICAL EXAMINATION:  HEENT. Exam EOMI PERRLA sclerae anicteric no conjunctival pallor oral because is moist neck is supple lungs are clear bilaterally to auscultation cardiovascular is regular rate rhythm abdomen is soft nontender nondistended bowel sounds are present no HSM extremities no edema neuro patient's alert oriented otherwise nonfocal. Bilateral breast examination is performed right breast reveals a well-healed incision over the right breast no masses no nipple discharge there is some darkening of the skin. Left breast no masses or nipple discharge the  ECOG PERFORMANCE STATUS: 1 - Symptomatic but completely ambulatory  Blood pressure 138/89, pulse 69, temperature 98.4 F (36.9 C), temperature source Oral, resp. rate 20, height 5\' 2"  (1.575 m), weight 153 lb 6.4 oz (69.582 kg).  LABORATORY DATA: Lab Results  Component Value Date   WBC 7.8 04/28/2012   HGB 12.8 04/28/2012   HCT 39.1 04/28/2012   MCV 88.3 04/28/2012   PLT 296 04/28/2012      Chemistry      Component Value Date/Time   NA 137 03/09/2012 0934   NA 138 11/19/2011 0821   K 4.2 03/09/2012 0934   K 3.8 11/19/2011 0821   CL 97* 03/09/2012 0934   CL 99 11/19/2011 0821   CO2 28 03/09/2012 0934   CO2 31 11/19/2011 0821   BUN 9.0 03/09/2012 0934   BUN 13 11/19/2011 0821   CREATININE 1.2* 03/09/2012 0934   CREATININE 1.20* 11/19/2011 0821      Component Value Date/Time   CALCIUM 9.7 03/09/2012 0934   CALCIUM 9.4 11/19/2011 0821   ALKPHOS 65 03/09/2012 0934   ALKPHOS 60 11/19/2011 0821   AST 16 03/09/2012 0934   AST 16 11/19/2011 0821   ALT 16 03/09/2012 0934   ALT 12 11/19/2011 0821   BILITOT 0.40 03/09/2012 0934   BILITOT 0.4 11/19/2011 0821    FINAL  DIAGNOSIS Diagnosis Breast, excision, right, posterior medial margin - BENIGN BREAST WITH FIBROCYSTIC CHANGE - PREVIOUS SURGICAL SITE CHANGES. - NEGATIVE FOR ATYPIA OR MALIGNANCY. - SEE COMMENT. Microscopic Comment The surgical resection margin(s) of the specimen were inked  and microscopically evaluated. (CR:jy) 08/12/11 [place] RUND DO Pathologist, Electronic Signature   RADIOGRAPHIC STUDIES: None  ASSESSMENT: 71 year old female with:  1.  high-grade ductal carcinoma in situ of the right breast status post lumpectomy with sentinel node biopsy. The final pathology revealed a 1.5 cm DCIS that is ER positive PR negative.   #2 patient is now status post radiation therapy to the right breast for high-grade DCIS. Overall she tolerated it well.  3 patient was begun on Aromasin 20 mg on a daily basis however she is having significant aches pains and problems with it And it was discontinued.  #4 patient was then begun on Arimidex 1 mg daily. She is tolerating this quite nicely.  PLAN:  #1 patient will continue Arimidex 1 mg daily.  #2 she will be seen back in 3 months time for followup.  All questions were answered. The patient knows to call the clinic with any problems, questions or concerns. We can certainly see the patient much sooner if necessary.  I spent >15 minutes counseling the patient face to face. The total time spent in the appointment was 30 minutes.    Drue Second, MD Medical/Oncology [facility] [phone] (beeper) [phone] (Office)  04/28/2012, 11:51 AM

## 2012-04-28 NOTE — Telephone Encounter (Signed)
Gave patient appointment for 08-04-2012 starting at 9:30am

## 2012-04-28 NOTE — Patient Instructions (Addendum)
Continue arimidex daily  I will see you back in 3 months

## 2012-05-08 ENCOUNTER — Other Ambulatory Visit (HOSPITAL_COMMUNITY): Payer: Self-pay | Admitting: Internal Medicine

## 2012-05-08 DIAGNOSIS — C50919 Malignant neoplasm of unspecified site of unspecified female breast: Secondary | ICD-10-CM

## 2012-05-11 DIAGNOSIS — R002 Palpitations: Secondary | ICD-10-CM | POA: Diagnosis not present

## 2012-05-11 DIAGNOSIS — I10 Essential (primary) hypertension: Secondary | ICD-10-CM | POA: Diagnosis not present

## 2012-05-11 DIAGNOSIS — E782 Mixed hyperlipidemia: Secondary | ICD-10-CM | POA: Diagnosis not present

## 2012-05-17 ENCOUNTER — Ambulatory Visit (HOSPITAL_COMMUNITY)
Admission: RE | Admit: 2012-05-17 | Discharge: 2012-05-17 | Disposition: A | Discharge status: Home / Self Care | Payer: Medicare Other | Source: Ambulatory Visit | Attending: Internal Medicine | Admitting: Internal Medicine

## 2012-05-17 DIAGNOSIS — C50919 Malignant neoplasm of unspecified site of unspecified female breast: Secondary | ICD-10-CM | POA: Diagnosis not present

## 2012-08-04 ENCOUNTER — Telehealth: Payer: Self-pay | Admitting: Oncology

## 2012-08-04 ENCOUNTER — Other Ambulatory Visit (HOSPITAL_BASED_OUTPATIENT_CLINIC_OR_DEPARTMENT_OTHER): Payer: Medicare Other | Admitting: Lab

## 2012-08-04 ENCOUNTER — Ambulatory Visit (HOSPITAL_BASED_OUTPATIENT_CLINIC_OR_DEPARTMENT_OTHER): Payer: Medicare Other | Admitting: Oncology

## 2012-08-04 ENCOUNTER — Encounter: Payer: Self-pay | Admitting: Oncology

## 2012-08-04 VITALS — BP 157/82 | HR 75 | Temp 98.3°F | Resp 20 | Ht 62.0 in | Wt 154.0 lb

## 2012-08-04 DIAGNOSIS — D0590 Unspecified type of carcinoma in situ of unspecified breast: Secondary | ICD-10-CM | POA: Diagnosis not present

## 2012-08-04 DIAGNOSIS — D0510 Intraductal carcinoma in situ of unspecified breast: Secondary | ICD-10-CM

## 2012-08-04 DIAGNOSIS — Z170 Estrogen receptor positive status [ER+]: Secondary | ICD-10-CM | POA: Diagnosis not present

## 2012-08-04 LAB — COMPREHENSIVE METABOLIC PANEL (CC13)
ALT: 12 U/L (ref 0–55)
AST: 15 U/L (ref 5–34)
Albumin: 3.8 g/dL (ref 3.5–5.0)
CO2: 27 mEq/L (ref 22–29)
Calcium: 9.6 mg/dL (ref 8.4–10.4)
Chloride: 100 mEq/L (ref 98–107)
Creatinine: 1.3 mg/dL — ABNORMAL HIGH (ref 0.6–1.1)
Potassium: 3.7 mEq/L (ref 3.5–5.1)
Sodium: 138 mEq/L (ref 136–145)
Total Protein: 7.1 g/dL (ref 6.4–8.3)

## 2012-08-04 LAB — CBC WITH DIFFERENTIAL/PLATELET
BASO%: 0.5 % (ref 0.0–2.0)
EOS%: 1.9 % (ref 0.0–7.0)
HCT: 36.9 % (ref 34.8–46.6)
MCH: 29.4 pg (ref 25.1–34.0)
MCHC: 33.8 g/dL (ref 31.5–36.0)
MONO#: 1 10*3/uL — ABNORMAL HIGH (ref 0.1–0.9)
RBC: 4.25 10*6/uL (ref 3.70–5.45)
RDW: 13.9 % (ref 11.2–14.5)
WBC: 8.9 10*3/uL (ref 3.9–10.3)
lymph#: 0.9 10*3/uL (ref 0.9–3.3)

## 2012-08-04 NOTE — Patient Instructions (Addendum)
Continue arimidex  Exercise and eat healthy  We will see you back in 6 months

## 2012-08-04 NOTE — Telephone Encounter (Signed)
gv pt appt schedule for August.  °

## 2012-08-04 NOTE — Progress Notes (Signed)
OFFICE PROGRESS NOTE  CC  Cassell Smiles., MD [address] [place] [address]  DIAGNOSIS: 72 year old female with ductal carcinoma in situ of the right breast status post lumpectomy performed on 07/02/2011.  PRIOR THERAPY:  #1 patient is status post lumpectomy of the right breast done on 07/02/2011. The final pathology revealed a ductal carcinoma in situ. The tumor was ER +100% PR 0%.  #2 Patient is s/p re-excision of the margins anre final pathology is negative for any residual carcinoma.  #3 patient is status post radiation therapy to the right breast. All of her therapy was given as an outpatient [facility]. She received a total of 30-33 treatments.  #4 patient will begin Aromasin 25 mg daily starting on 05/17/2013But this was discontinued after 3 months.  #5 patient was then started on Arimidex 1 mg daily one month ago. She is thus far tolerating this  CURRENT THERAPY: Arimidex 1 mg daily  INTERVAL HISTORY: 72 year old female seen in followup today.Overall she is doing well she is now on Arimidex 1 mg. She seems to be tolerating this a lot better without any significant aches and pains myalgias arthralgias or mood swings. She is denying any hot flashes or vaginal bleeding or discharge.She has no panic attacks currently. No peripheral paresthesias no back pain no breast tenderness. Remainder of the 10 point review of systems is negative. MEDICAL HISTORY: Past Medical History  Diagnosis Date  . PONV (postoperative nausea and vomiting)   . Hyperlipidemia     takes Zocor daily  . Shortness of breath     with exertion  . Dizziness     occasionally  . Arthritis     back   . Chronic back pain   . Osteoporosis   . History of shingles 2011  . GERD (gastroesophageal reflux disease)     takes Nexium daily  . Gastric ulcer   . Hemorrhoids   . Hx of colonic polyps   . Diverticulosis   . Urinary frequency   . Nocturia    . Hypothyroidism     takes Synthroid daily  . Cataract     right eye  . Anxiety     takes Ativan every 4hrs  . Anxiety 07/07/2011  . Hypertension     takes Bystolic and Losartan daily  . Asthma     uses inhaler prn  . Depression     has appt to discuss depression 07/26/2011  . Nausea alone     onset last Wednesday  08/04/11  . Cancer     rt breast    ALLERGIES:  is allergic to hydrocodone; oxycodone hcl; ziac; and penicillins.  MEDICATIONS:  Current Outpatient Prescriptions  Medication Sig Dispense Refill  . albuterol (PROVENTIL HFA;VENTOLIN HFA) 108 (90 BASE) MCG/ACT inhaler Inhale 2 puffs into the lungs every 6 (six) hours as needed. For shortness of breath      . anastrozole (ARIMIDEX) 1 MG tablet Take 1 mg by mouth daily.      . Calcium-Vitamin D-Vitamin K (VIACTIV PO) Take 1 tablet by mouth 2 (two) times daily. chewable      . citalopram (CELEXA) 20 MG tablet Take 20 mg by mouth daily.      Marland Kitchen esomeprazole (NEXIUM) 40 MG capsule Take 40 mg by mouth 2 (two) times daily.       . fish oil-omega-3 fatty acids 1000 MG capsule Take 2 g by mouth daily.       Marland Kitchen  hydrochlorothiazide (MICROZIDE) 12.5 MG capsule Take 12.5 mg by mouth daily.      Marland Kitchen levothyroxine (SYNTHROID, LEVOTHROID) 50 MCG tablet Take 50 mcg by mouth daily.      Marland Kitchen LORazepam (ATIVAN) 1 MG tablet Take 1 mg by mouth every 6 (six) hours as needed. For anxiety      . losartan (COZAAR) 100 MG tablet Take 100 mg by mouth daily.        . nebivolol (BYSTOLIC) 10 MG tablet Take 10 mg by mouth daily.        . simvastatin (ZOCOR) 40 MG tablet Take 40 mg by mouth at bedtime.        . hydrocortisone cream 0.5 % Apply 1 application topically 2 (two) times daily as needed. For hemorrhoid pain        SURGICAL HISTORY:  Past Surgical History  Procedure Date  . Abdominal hysterectomy 2001  . Hemorrhoid surgery   . Colonscopy   . Breast surgery 1972    left lumpectomy  . Breast lumpectomy 07/02/11    right breast and SNL  .  Breast surgery 08/11/11    margins on rt breast    REVIEW OF SYSTEMS:  Pertinent items are noted in HPI.   PHYSICAL EXAMINATION:  HEENT. Exam EOMI PERRLA sclerae anicteric no conjunctival pallor oral because is moist neck is supple lungs are clear bilaterally to auscultation cardiovascular is regular rate rhythm abdomen is soft nontender nondistended bowel sounds are present no HSM extremities no edema neuro patient's alert oriented otherwise nonfocal. Bilateral breast examination is performed right breast reveals a well-healed incision over the right breast no masses no nipple discharge there is some darkening of the skin. Left breast no masses or nipple discharge the  ECOG PERFORMANCE STATUS: 1 - Symptomatic but completely ambulatory  Blood pressure 157/82, pulse 75, temperature 98.3 F (36.8 C), temperature source Oral, resp. rate 20, height 5\' 2"  (1.575 m), weight 154 lb (69.854 kg).  LABORATORY DATA: Lab Results  Component Value Date   WBC 8.9 08/04/2012   HGB 12.5 08/04/2012   HCT 36.9 08/04/2012   MCV 87.0 08/04/2012   PLT 288 08/04/2012      Chemistry      Component Value Date/Time   NA 135* 04/28/2012 1108   NA 138 11/19/2011 0821   K 4.0 04/28/2012 1108   K 3.8 11/19/2011 0821   CL 96* 04/28/2012 1108   CL 99 11/19/2011 0821   CO2 28 04/28/2012 1108   CO2 31 11/19/2011 0821   BUN 9.0 04/28/2012 1108   BUN 13 11/19/2011 0821   CREATININE 1.1 04/28/2012 1108   CREATININE 1.20* 11/19/2011 0821      Component Value Date/Time   CALCIUM 10.0 04/28/2012 1108   CALCIUM 9.4 11/19/2011 0821   ALKPHOS 75 04/28/2012 1108   ALKPHOS 60 11/19/2011 0821   AST 19 04/28/2012 1108   AST 16 11/19/2011 0821   ALT 17 04/28/2012 1108   ALT 12 11/19/2011 0821   BILITOT 0.80 04/28/2012 1108   BILITOT 0.4 11/19/2011 0821    FINAL DIAGNOSIS Diagnosis Breast, excision, right, posterior medial margin - BENIGN BREAST WITH FIBROCYSTIC CHANGE - PREVIOUS SURGICAL SITE CHANGES. - NEGATIVE FOR ATYPIA  OR MALIGNANCY. - SEE COMMENT. Microscopic Comment The surgical resection margin(s) of the specimen were inked and microscopically evaluated. (CR:jy) 08/12/11 [place] RUND DO Pathologist, Electronic Signature   RADIOGRAPHIC STUDIES: None  ASSESSMENT: 72 year old female with:  1.  high-grade ductal carcinoma in situ of the right  breast status post lumpectomy with sentinel node biopsy. The final pathology revealed a 1.5 cm DCIS that is ER positive PR negative.   #2 patient is now status post radiation therapy to the right breast for high-grade DCIS. Overall she tolerated it well.  3 patient was begun on Aromasin 20 mg on a daily basis however she is having significant aches pains and problems with it And it was discontinued.  #4 patient was then begun on Arimidex 1 mg daily. She is tolerating this quite nicely.  PLAN:  #1 patient will continue Arimidex 1 mg daily.  #2 she will be seen back in 6  months time for followup.  All questions were answered. The patient knows to call the clinic with any problems, questions or concerns. We can certainly see the patient much sooner if necessary.  I spent 25 minutes counseling the patient face to face. The total time spent in the appointment was 30 minutes.    Drue Second, MD Medical/Oncology [facility] [phone] (beeper) [phone] (Office)  08/04/2012, 10:11 AM

## 2012-08-09 ENCOUNTER — Ambulatory Visit (HOSPITAL_COMMUNITY)
Admission: RE | Admit: 2012-08-09 | Discharge: 2012-08-09 | Disposition: A | Discharge status: Home / Self Care | Payer: Medicare Other | Source: Ambulatory Visit | Attending: Internal Medicine | Admitting: Internal Medicine

## 2012-08-09 ENCOUNTER — Other Ambulatory Visit (HOSPITAL_COMMUNITY): Payer: Self-pay | Admitting: Physician Assistant

## 2012-08-09 ENCOUNTER — Other Ambulatory Visit (HOSPITAL_COMMUNITY): Payer: Self-pay | Admitting: Internal Medicine

## 2012-08-09 DIAGNOSIS — Z6828 Body mass index (BMI) 28.0-28.9, adult: Secondary | ICD-10-CM | POA: Diagnosis not present

## 2012-08-09 DIAGNOSIS — M79609 Pain in unspecified limb: Secondary | ICD-10-CM | POA: Insufficient documentation

## 2012-08-09 DIAGNOSIS — IMO0002 Reserved for concepts with insufficient information to code with codable children: Secondary | ICD-10-CM | POA: Diagnosis not present

## 2012-08-28 ENCOUNTER — Ambulatory Visit (INDEPENDENT_AMBULATORY_CARE_PROVIDER_SITE_OTHER): Payer: Medicare Other | Admitting: Surgery

## 2012-08-28 ENCOUNTER — Encounter (INDEPENDENT_AMBULATORY_CARE_PROVIDER_SITE_OTHER): Payer: Self-pay | Admitting: Surgery

## 2012-08-28 VITALS — BP 134/80 | HR 70 | Temp 98.1°F | Resp 14 | Ht 62.0 in | Wt 155.2 lb

## 2012-08-28 DIAGNOSIS — Z853 Personal history of malignant neoplasm of breast: Secondary | ICD-10-CM | POA: Diagnosis not present

## 2012-08-28 DIAGNOSIS — Z86000 Personal history of in-situ neoplasm of breast: Secondary | ICD-10-CM

## 2012-08-28 NOTE — Progress Notes (Signed)
Subjective:     Patient ID: Jane Wilkerson, female   DOB: 01/23/1941, 72 y.o.   MRN: [id number]  HPI She is here for long-term followup of the ductal carcinoma in situ of the right breast. She is  Doing well. She only has mild discomfort from the breast. There is no nipple discharge.  Review of Systems     Objective:   Physical Exam On exam, her incision is well-healed. There is mild scarring. There are no palpable masses no axillary adenopathy on the right side.    Assessment:     Long-term followup ductal carcinoma in situ right breast     Plan:     She will continue to be followed at the cancer Center. I will see her back in one year

## 2012-11-23 DIAGNOSIS — I10 Essential (primary) hypertension: Secondary | ICD-10-CM | POA: Diagnosis not present

## 2012-11-23 DIAGNOSIS — K219 Gastro-esophageal reflux disease without esophagitis: Secondary | ICD-10-CM | POA: Diagnosis not present

## 2012-11-23 DIAGNOSIS — Z6828 Body mass index (BMI) 28.0-28.9, adult: Secondary | ICD-10-CM | POA: Diagnosis not present

## 2012-11-23 DIAGNOSIS — Z79899 Other long term (current) drug therapy: Secondary | ICD-10-CM | POA: Diagnosis not present

## 2012-11-23 DIAGNOSIS — Z0000 Encounter for general adult medical examination without abnormal findings: Secondary | ICD-10-CM | POA: Diagnosis not present

## 2012-11-23 DIAGNOSIS — E785 Hyperlipidemia, unspecified: Secondary | ICD-10-CM | POA: Diagnosis not present

## 2012-12-08 ENCOUNTER — Other Ambulatory Visit: Payer: Self-pay | Admitting: *Deleted

## 2012-12-08 DIAGNOSIS — D0590 Unspecified type of carcinoma in situ of unspecified breast: Secondary | ICD-10-CM

## 2012-12-08 MED ORDER — ANASTROZOLE 1 MG PO TABS: 1.0000 mg | ORAL_TABLET | Freq: Every day | ORAL | Status: DC

## 2013-01-31 ENCOUNTER — Other Ambulatory Visit (HOSPITAL_COMMUNITY): Payer: Self-pay | Admitting: Nephrology

## 2013-01-31 DIAGNOSIS — E559 Vitamin D deficiency, unspecified: Secondary | ICD-10-CM | POA: Diagnosis not present

## 2013-01-31 DIAGNOSIS — N289 Disorder of kidney and ureter, unspecified: Secondary | ICD-10-CM

## 2013-01-31 DIAGNOSIS — I10 Essential (primary) hypertension: Secondary | ICD-10-CM | POA: Diagnosis not present

## 2013-02-05 ENCOUNTER — Other Ambulatory Visit (HOSPITAL_BASED_OUTPATIENT_CLINIC_OR_DEPARTMENT_OTHER): Payer: Medicare Other | Admitting: Lab

## 2013-02-05 ENCOUNTER — Ambulatory Visit (HOSPITAL_BASED_OUTPATIENT_CLINIC_OR_DEPARTMENT_OTHER): Payer: Medicare Other | Admitting: Oncology

## 2013-02-05 ENCOUNTER — Telehealth: Payer: Self-pay | Admitting: *Deleted

## 2013-02-05 VITALS — BP 139/90 | HR 72 | Temp 98.3°F | Resp 20 | Ht 62.0 in | Wt 157.3 lb

## 2013-02-05 DIAGNOSIS — F329 Major depressive disorder, single episode, unspecified: Secondary | ICD-10-CM

## 2013-02-05 DIAGNOSIS — D0590 Unspecified type of carcinoma in situ of unspecified breast: Secondary | ICD-10-CM

## 2013-02-05 DIAGNOSIS — I10 Essential (primary) hypertension: Secondary | ICD-10-CM | POA: Diagnosis not present

## 2013-02-05 DIAGNOSIS — M810 Age-related osteoporosis without current pathological fracture: Secondary | ICD-10-CM

## 2013-02-05 DIAGNOSIS — C50911 Malignant neoplasm of unspecified site of right female breast: Secondary | ICD-10-CM

## 2013-02-05 DIAGNOSIS — D0510 Intraductal carcinoma in situ of unspecified breast: Secondary | ICD-10-CM

## 2013-02-05 LAB — CBC WITH DIFFERENTIAL/PLATELET
BASO%: 0.9 % (ref 0.0–2.0)
EOS%: 3.1 % (ref 0.0–7.0)
HCT: 36.7 % (ref 34.8–46.6)
MCH: 29.1 pg (ref 25.1–34.0)
MCHC: 33.2 g/dL (ref 31.5–36.0)
NEUT%: 66.7 % (ref 38.4–76.8)
RBC: 4.2 10*6/uL (ref 3.70–5.45)
WBC: 7.5 10*3/uL (ref 3.9–10.3)
lymph#: 1.1 10*3/uL (ref 0.9–3.3)

## 2013-02-05 LAB — COMPREHENSIVE METABOLIC PANEL (CC13)
ALT: 12 U/L (ref 0–55)
AST: 14 U/L (ref 5–34)
Chloride: 101 mEq/L (ref 98–109)
Creatinine: 1.4 mg/dL — ABNORMAL HIGH (ref 0.6–1.1)
Sodium: 139 mEq/L (ref 136–145)
Total Bilirubin: 0.3 mg/dL (ref 0.20–1.20)
Total Protein: 6.9 g/dL (ref 6.4–8.3)

## 2013-02-05 NOTE — Telephone Encounter (Signed)
appts made and printed...td 

## 2013-02-05 NOTE — Patient Instructions (Addendum)
Continue arimidex  1 mg daily  I will see you back in 1 year

## 2013-02-06 ENCOUNTER — Other Ambulatory Visit: Payer: Self-pay | Admitting: Medical Oncology

## 2013-02-06 DIAGNOSIS — D0590 Unspecified type of carcinoma in situ of unspecified breast: Secondary | ICD-10-CM

## 2013-02-06 MED ORDER — ANASTROZOLE 1 MG PO TABS: 1.0000 mg | ORAL_TABLET | Freq: Every day | ORAL | Status: DC

## 2013-02-11 NOTE — Progress Notes (Signed)
OFFICE PROGRESS NOTE  CC  Cassell Smiles., MD [address] [place] [address]  DIAGNOSIS: 72 year old female with ductal carcinoma in situ of the right breast status post lumpectomy performed on 07/02/2011.  PRIOR THERAPY:  #1 patient is status post lumpectomy of the right breast done on 07/02/2011. The final pathology revealed a ductal carcinoma in situ. The tumor was ER +100% PR 0%.  #2 Patient is s/p re-excision of the margins anre final pathology is negative for any residual carcinoma.  #3 patient is status post radiation therapy to the right breast. All of her therapy was given as an outpatient at the [facility] radiation [facility]. She received a total of 30-33 treatments.  #4 patient will begin Aromasin 25 mg daily starting on 05/17/2013But this was discontinued after 3 months.  #5 patient was then started on Arimidex 1 mg daily one month ago. She is thus far tolerating this  CURRENT THERAPY: Arimidex 1 mg daily  INTERVAL HISTORY: 72 year old female seen in followup today.Overall she is doing well she is now on Arimidex 1 mg. She seems to be tolerating this a lot better without any significant aches and pains myalgias arthralgias or mood swings. She is denying any hot flashes or vaginal bleeding or discharge.She has no panic attacks currently. No peripheral paresthesias no back pain no breast tenderness. Remainder of the 10 point review of systems is negative. MEDICAL HISTORY: Past Medical History  Diagnosis Date  . PONV (postoperative nausea and vomiting)   . Hyperlipidemia     takes Zocor daily  . Shortness of breath     with exertion  . Dizziness     occasionally  . Arthritis     back   . Chronic back pain   . Osteoporosis   . History of shingles 2011  . GERD (gastroesophageal reflux disease)     takes Nexium daily  . Gastric ulcer   . Hemorrhoids   . Hx of colonic polyps   . Diverticulosis   . Urinary frequency   . Nocturia    . Hypothyroidism     takes Synthroid daily  . Cataract     right eye  . Anxiety     takes Ativan every 4hrs  . Anxiety 07/07/2011  . Hypertension     takes Bystolic and Losartan daily  . Asthma     uses inhaler prn  . Depression     has appt to discuss depression 07/26/2011  . Nausea alone     onset last Wednesday  08/04/11  . Cancer     rt breast    ALLERGIES:  is allergic to hydrocodone; oxycodone hcl; ziac; and penicillins.  MEDICATIONS:  Current Outpatient Prescriptions  Medication Sig Dispense Refill  . Calcium Carbonate (CALTRATE 600 PO) Take by mouth daily.      . citalopram (CELEXA) 20 MG tablet Take 20 mg by mouth daily.      Marland Kitchen esomeprazole (NEXIUM) 40 MG capsule Take 40 mg by mouth 2 (two) times daily.       . fish oil-omega-3 fatty acids 1000 MG capsule Take 2 g by mouth daily.       . hydrochlorothiazide (MICROZIDE) 12.5 MG capsule Take 12.5 mg by mouth daily.      . hydrocortisone cream 0.5 % Apply 1 application topically 2 (two) times daily as needed. For hemorrhoid pain      . levothyroxine (SYNTHROID, LEVOTHROID) 50 MCG tablet Take 50 mcg by mouth daily.      Marland Kitchen  LORazepam (ATIVAN) 1 MG tablet Take 1 mg by mouth every 6 (six) hours as needed. For anxiety      . nebivolol (BYSTOLIC) 10 MG tablet Take 10 mg by mouth daily.        . simvastatin (ZOCOR) 40 MG tablet Take 40 mg by mouth at bedtime.        Marland Kitchen albuterol (PROVENTIL HFA;VENTOLIN HFA) 108 (90 BASE) MCG/ACT inhaler Inhale 2 puffs into the lungs every 6 (six) hours as needed. For shortness of breath      . anastrozole (ARIMIDEX) 1 MG tablet Take 1 tablet (1 mg total) by mouth daily.  90 tablet  0  . losartan (COZAAR) 100 MG tablet Take 100 mg by mouth daily.        No current facility-administered medications for this visit.    SURGICAL HISTORY:  Past Surgical History  Procedure Laterality Date  . Abdominal hysterectomy  2001  . Hemorrhoid surgery    . Colonscopy    . Breast surgery  1972    left  lumpectomy  . Breast lumpectomy  07/02/11    right breast and SNL  . Breast surgery  08/11/11    margins on rt breast    REVIEW OF SYSTEMS:  Pertinent items are noted in HPI.   PHYSICAL EXAMINATION:  HEENT. Exam EOMI PERRLA sclerae anicteric no conjunctival pallor oral because is moist neck is supple lungs are clear bilaterally to auscultation cardiovascular is regular rate rhythm abdomen is soft nontender nondistended bowel sounds are present no HSM extremities no edema neuro patient's alert oriented otherwise nonfocal. Bilateral breast examination is performed right breast reveals a well-healed incision over the right breast no masses no nipple discharge there is some darkening of the skin. Left breast no masses or nipple discharge the  ECOG PERFORMANCE STATUS: 1 - Symptomatic but completely ambulatory  Blood pressure 139/90, pulse 72, temperature 98.3 F (36.8 C), temperature source Oral, resp. rate 20, height 5\' 2"  (1.575 m), weight 157 lb 4.8 oz (71.351 kg).  LABORATORY DATA: Lab Results  Component Value Date   WBC 7.5 02/05/2013   HGB 12.2 02/05/2013   HCT 36.7 02/05/2013   MCV 87.5 02/05/2013   PLT 305 02/05/2013      Chemistry      Component Value Date/Time   NA 139 02/05/2013 0923   NA 138 11/19/2011 0821   K 3.9 02/05/2013 0923   K 3.8 11/19/2011 0821   CL 100 08/04/2012 0921   CL 99 11/19/2011 0821   CO2 28 02/05/2013 0923   CO2 31 11/19/2011 0821   BUN 16.3 02/05/2013 0923   BUN 13 11/19/2011 0821   CREATININE 1.4* 02/05/2013 0923   CREATININE 1.20* 11/19/2011 0821      Component Value Date/Time   CALCIUM 9.7 02/05/2013 0923   CALCIUM 9.4 11/19/2011 0821   ALKPHOS 59 02/05/2013 0923   ALKPHOS 60 11/19/2011 0821   AST 14 02/05/2013 0923   AST 16 11/19/2011 0821   ALT 12 02/05/2013 0923   ALT 12 11/19/2011 0821   BILITOT 0.30 02/05/2013 0923   BILITOT 0.4 11/19/2011 0821    FINAL DIAGNOSIS Diagnosis Breast, excision, right, posterior medial margin - BENIGN BREAST WITH FIBROCYSTIC CHANGE -  PREVIOUS SURGICAL SITE CHANGES. - NEGATIVE FOR ATYPIA OR MALIGNANCY. - SEE COMMENT. Microscopic Comment The surgical resection margin(s) of the specimen were inked and microscopically evaluated. (CR:jy) 08/12/11 [place] RUND DO Pathologist, Electronic Signature   RADIOGRAPHIC STUDIES: None  ASSESSMENT: 72 year old female with:  1.  high-grade ductal carcinoma in situ of the right breast status post lumpectomy with sentinel node biopsy. The final pathology revealed a 1.5 cm DCIS that is ER positive PR negative.   #2 patient is now status post radiation therapy to the right breast for high-grade DCIS. Overall she tolerated it well.  3 patient was begun on Aromasin 20 mg on a daily basis however she is having significant aches pains and problems with it And it was discontinued.  #4 patient was then begun on Arimidex 1 mg daily. She is tolerating this quite nicely.  PLAN:  #1 patient will continue Arimidex 1 mg daily.  #2 she will be seen back in 12  months time for followup.  All questions were answered. The patient knows to call the clinic with any problems, questions or concerns. We can certainly see the patient much sooner if necessary.  I spent 15 minutes counseling the patient face to face. The total time spent in the appointment was 30 minutes.    Drue Second, MD Medical/Oncology [facility] [phone] (beeper) [phone] (Office)

## 2013-02-14 DIAGNOSIS — Z79899 Other long term (current) drug therapy: Secondary | ICD-10-CM | POA: Diagnosis not present

## 2013-02-14 DIAGNOSIS — R809 Proteinuria, unspecified: Secondary | ICD-10-CM | POA: Diagnosis not present

## 2013-02-14 DIAGNOSIS — D649 Anemia, unspecified: Secondary | ICD-10-CM | POA: Diagnosis not present

## 2013-02-19 ENCOUNTER — Ambulatory Visit (HOSPITAL_COMMUNITY)
Admission: RE | Admit: 2013-02-19 | Discharge: 2013-02-19 | Disposition: A | Discharge status: Home / Self Care | Payer: Medicare Other | Source: Ambulatory Visit | Attending: Nephrology | Admitting: Nephrology

## 2013-02-19 DIAGNOSIS — N189 Chronic kidney disease, unspecified: Secondary | ICD-10-CM | POA: Insufficient documentation

## 2013-02-19 DIAGNOSIS — I129 Hypertensive chronic kidney disease with stage 1 through stage 4 chronic kidney disease, or unspecified chronic kidney disease: Secondary | ICD-10-CM | POA: Insufficient documentation

## 2013-02-19 DIAGNOSIS — N269 Renal sclerosis, unspecified: Secondary | ICD-10-CM | POA: Diagnosis not present

## 2013-02-19 DIAGNOSIS — N289 Disorder of kidney and ureter, unspecified: Secondary | ICD-10-CM

## 2013-03-21 DIAGNOSIS — I10 Essential (primary) hypertension: Secondary | ICD-10-CM | POA: Diagnosis not present

## 2013-03-21 DIAGNOSIS — Z719 Counseling, unspecified: Secondary | ICD-10-CM | POA: Diagnosis not present

## 2013-04-26 ENCOUNTER — Other Ambulatory Visit (HOSPITAL_COMMUNITY): Payer: Self-pay | Admitting: Internal Medicine

## 2013-04-26 DIAGNOSIS — C50911 Malignant neoplasm of unspecified site of right female breast: Secondary | ICD-10-CM

## 2013-05-06 ENCOUNTER — Encounter: Payer: Self-pay | Admitting: *Deleted

## 2013-05-07 ENCOUNTER — Encounter: Payer: Self-pay | Admitting: Internal Medicine

## 2013-05-08 ENCOUNTER — Encounter: Payer: Self-pay | Admitting: Internal Medicine

## 2013-05-08 ENCOUNTER — Ambulatory Visit (INDEPENDENT_AMBULATORY_CARE_PROVIDER_SITE_OTHER): Payer: Medicare Other | Admitting: Internal Medicine

## 2013-05-08 VITALS — BP 136/88 | HR 75 | Ht 62.0 in | Wt 158.4 lb

## 2013-05-08 DIAGNOSIS — R002 Palpitations: Secondary | ICD-10-CM | POA: Diagnosis not present

## 2013-05-08 DIAGNOSIS — Z79899 Other long term (current) drug therapy: Secondary | ICD-10-CM

## 2013-05-08 DIAGNOSIS — E785 Hyperlipidemia, unspecified: Secondary | ICD-10-CM | POA: Insufficient documentation

## 2013-05-08 NOTE — Progress Notes (Signed)
OFFICE NOTE  Chief Complaint:  Routine follow-up  Primary Care Physician: Lenise Herald, PA-Wilkerson  HPI:  Jane Wilkerson  is a 72 year old female with a history of PVCs and bigeminy but had a stress test in May which was negative and that seemed to have resolved. She has had some reflux symptoms as well which improved. Unfortunately over the past year I understand she was diagnosed with breast cancer which is DCIS and underwent local excision and is on chemotherapy. She seems to be doing well and is about a year out from that, and fortunately that was caught early on mammogram. From a cardiac standpoint she denies any chest pain, worsening shortness of breath, palpitations, presyncope or syncopal symptoms.  Recently she had been told that she had some abnormalities with her renal function. She was referred to a nephrologist who she saw a couple times and underwent a 24-hour urine in additional blood work. She was subsequently released. She's not had any recent lipid profile testing not aware of. She continues to have some palpitations although they're not quite as bothersome.  She is very anxious today as her sister is submitted to [place] with significant pneumonia/infection.  PMHx:  Past Medical History  Diagnosis Date  . PONV (postoperative nausea and vomiting)   . Hyperlipidemia     takes Zocor daily  . Shortness of breath     with exertion  . Dizziness     occasionally  . Arthritis     back   . Chronic back pain   . Osteoporosis   . History of shingles 2011  . GERD (gastroesophageal reflux disease)     takes Nexium daily  . Gastric ulcer   . Hemorrhoids   . Hx of colonic polyps   . Diverticulosis   . Urinary frequency   . Nocturia   . Hypothyroidism     takes Synthroid daily  . Cataract     right eye  . Anxiety 07/07/2011    takes Ativan every 4hrs  . Hypertension     takes Bystolic and Losartan daily  . Asthma     uses inhaler prn  . Depression     has appt to  discuss depression 07/26/2011  . Nausea alone     onset last Wednesday  08/04/11  . Breast cancer     right - local excision, chemotheraphy  . PVC's (premature ventricular contractions)   . History of nuclear stress test 11/2010    lexiscan; no evidence of inducible ischemia; normal pattern of perfusion     Past Surgical History  Procedure Laterality Date  . Abdominal hysterectomy  2001  . Hemorrhoid surgery    . Colonscopy    . Breast surgery Left 1972    left lumpectomy  . Breast lumpectomy Right 07/02/11    right breast and SNL  . Breast surgery Right 08/11/11    margins on rt breast  . Transthoracic echocardiogram  2009    normal LV & RV systolic function; mild mitral annular calcif & mild MR; trace TR    FAMHx:  Family History  Problem Relation Age of Onset  . Colon cancer Mother     also stroke  . Pancreatic cancer Father     also heart disease  . Anesthesia problems Neg Hx   . Hypotension Neg Hx   . Malignant hyperthermia Neg Hx   . Pseudochol deficiency Neg Hx   . Diabetes Sister   . Hypertension Sister  SOCHx:   reports that she has never smoked. She has never used smokeless tobacco. She reports that she does not drink alcohol or use illicit drugs.  ALLERGIES:  Allergies  Allergen Reactions  . Hydrocodone Other (See Comments)    Reaction unknown  . Oxycodone Hcl Itching  . Ziac [Bisoprolol-Hydrochlorothiazide] Other (See Comments)    Reaction unknown  . Penicillins Rash    ROS: A comprehensive review of systems was negative except for: Cardiovascular: positive for palpitations Genitourinary: positive for abnromal renal function  HOME MEDS: Current Outpatient Prescriptions  Medication Sig Dispense Refill  . albuterol (PROVENTIL HFA;VENTOLIN HFA) 108 (90 BASE) MCG/ACT inhaler Inhale 2 puffs into the lungs every 6 (six) hours as needed. For shortness of breath      . anastrozole (ARIMIDEX) 1 MG tablet Take 1 tablet (1 mg total) by mouth daily.  90  tablet  0  . Calcium Carbonate (CALTRATE 600 PO) Take by mouth daily.      . citalopram (CELEXA) 20 MG tablet Take 20 mg by mouth daily.      Marland Kitchen esomeprazole (NEXIUM) 40 MG capsule Take 40 mg by mouth 2 (two) times daily.       . fish oil-omega-3 fatty acids 1000 MG capsule Take 2 g by mouth daily.       . hydrochlorothiazide (MICROZIDE) 12.5 MG capsule Take 12.5 mg by mouth daily.      . hydrocortisone cream 0.5 % Apply 1 application topically 2 (two) times daily as needed. For hemorrhoid pain      . levothyroxine (SYNTHROID, LEVOTHROID) 50 MCG tablet Take 50 mcg by mouth daily.      Marland Kitchen LORazepam (ATIVAN) 1 MG tablet Take 1 mg by mouth every 6 (six) hours as needed. For anxiety      . losartan (COZAAR) 100 MG tablet Take 100 mg by mouth daily.       . nebivolol (BYSTOLIC) 10 MG tablet Take 10 mg by mouth daily.        . simvastatin (ZOCOR) 40 MG tablet Take 40 mg by mouth at bedtime.         No current facility-administered medications for this visit.    LABS/IMAGING: No results found for this or any previous visit (from the past 48 hour(s)). No results found.  VITALS: BP 136/88  Pulse 75  Ht 5\' 2"  (1.575 m)  Wt 158 lb 6.4 oz (71.85 kg)  BMI 28.96 kg/m2  EXAM: General appearance: alert and no distress Neck: no carotid bruit and no JVD Lungs: clear to auscultation bilaterally Heart: regular rate and rhythm, S1, S2 normal, no murmur, click, rub or gallop Abdomen: soft, non-tender; bowel sounds normal; no masses,  no organomegaly Extremities: extremities normal, atraumatic, no cyanosis or edema Pulses: 2+ and symmetric Skin: Skin color, texture, turgor normal. No rashes or lesions Neurologic: Grossly normal Psych: Anxious  EKG: Sinus rhythm with PVC at 75  ASSESSMENT: 1. Palpitations-generally well controlled 2. Dyslipidemia-due for a cholesterol check 3. Recent history of breast cancer 4. Abnormal renal function by report  PLAN: 1.   Ms. Araki is doing fairly well, but  by her report had recently decreased renal function. She cannot describe this any further and I do not have labs to evaluate. She is due for lipid profile, therefore will check that as well as a comprehensive metabolic profile. I plan to contact her with those results. Overall her palpitation control is good on her current medicine I would continue that.  I  can see her back in 6 months or sooner as needed.  Chrystie Nose, MD, [facility] Attending Cardiologist [facility]  Jane Wilkerson 05/08/2013, 6:29 PM

## 2013-05-08 NOTE — Patient Instructions (Signed)
Please have fasting blood work at your earliest convenience.   Your physician wants you to follow-up in: 6 months. You will receive a reminder letter in the mail two months in advance. If you don't receive a letter, please call our office to schedule the follow-up appointment.

## 2013-05-11 DIAGNOSIS — Z79899 Other long term (current) drug therapy: Secondary | ICD-10-CM | POA: Diagnosis not present

## 2013-05-11 DIAGNOSIS — E785 Hyperlipidemia, unspecified: Secondary | ICD-10-CM | POA: Diagnosis not present

## 2013-05-11 LAB — COMPREHENSIVE METABOLIC PANEL
ALT: 10 U/L (ref 0–35)
AST: 12 U/L (ref 0–37)
Albumin: 4.2 g/dL (ref 3.5–5.2)
Alkaline Phosphatase: 59 U/L (ref 39–117)
CO2: 32 mEq/L (ref 19–32)
Creat: 1.32 mg/dL — ABNORMAL HIGH (ref 0.50–1.10)
Glucose, Bld: 108 mg/dL — ABNORMAL HIGH (ref 70–99)
Potassium: 4.2 mEq/L (ref 3.5–5.3)
Sodium: 138 mEq/L (ref 135–145)
Total Bilirubin: 0.4 mg/dL (ref 0.3–1.2)
Total Protein: 6.7 g/dL (ref 6.0–8.3)

## 2013-05-14 LAB — NMR LIPOPROFILE WITH LIPIDS
Cholesterol, Total: 169 mg/dL (ref <200)
HDL Particle Number: 36.8 umol/L (ref >=30.5)
LDL Particle Number: 1097 nmol/L — ABNORMAL HIGH (ref <1000)
LDL Size: 20.3 nm — ABNORMAL LOW (ref >20.5)
LP-IR Score: 82 — ABNORMAL HIGH (ref <=45)
Large HDL-P: 2.6 umol/L — ABNORMAL LOW (ref >=4.8)
Large VLDL-P: 13.3 nmol/L — ABNORMAL HIGH (ref <=2.7)
Small LDL Particle Number: 582 nmol/L — ABNORMAL HIGH (ref <=527)
Triglycerides: 231 mg/dL — ABNORMAL HIGH (ref <150)

## 2013-05-23 ENCOUNTER — Encounter (HOSPITAL_COMMUNITY): Payer: Medicare Other

## 2013-05-23 ENCOUNTER — Ambulatory Visit (HOSPITAL_COMMUNITY)
Admission: RE | Admit: 2013-05-23 | Discharge: 2013-05-23 | Disposition: A | Discharge status: Home / Self Care | Payer: Medicare Other | Source: Ambulatory Visit | Attending: Internal Medicine | Admitting: Internal Medicine

## 2013-05-23 DIAGNOSIS — R928 Other abnormal and inconclusive findings on diagnostic imaging of breast: Secondary | ICD-10-CM | POA: Diagnosis not present

## 2013-05-23 DIAGNOSIS — Z853 Personal history of malignant neoplasm of breast: Secondary | ICD-10-CM | POA: Diagnosis not present

## 2013-05-23 DIAGNOSIS — C50911 Malignant neoplasm of unspecified site of right female breast: Secondary | ICD-10-CM

## 2013-07-04 ENCOUNTER — Other Ambulatory Visit: Payer: Self-pay | Admitting: Oncology

## 2013-07-04 DIAGNOSIS — C50919 Malignant neoplasm of unspecified site of unspecified female breast: Secondary | ICD-10-CM

## 2013-07-09 DIAGNOSIS — I10 Essential (primary) hypertension: Secondary | ICD-10-CM | POA: Diagnosis not present

## 2013-07-09 DIAGNOSIS — D649 Anemia, unspecified: Secondary | ICD-10-CM | POA: Diagnosis not present

## 2013-07-09 DIAGNOSIS — R809 Proteinuria, unspecified: Secondary | ICD-10-CM | POA: Diagnosis not present

## 2013-07-09 DIAGNOSIS — N183 Chronic kidney disease, stage 3 unspecified: Secondary | ICD-10-CM | POA: Diagnosis not present

## 2013-07-09 DIAGNOSIS — Z79899 Other long term (current) drug therapy: Secondary | ICD-10-CM | POA: Diagnosis not present

## 2013-07-11 DIAGNOSIS — N183 Chronic kidney disease, stage 3 unspecified: Secondary | ICD-10-CM | POA: Diagnosis not present

## 2013-07-11 DIAGNOSIS — E559 Vitamin D deficiency, unspecified: Secondary | ICD-10-CM | POA: Diagnosis not present

## 2013-07-11 DIAGNOSIS — R809 Proteinuria, unspecified: Secondary | ICD-10-CM | POA: Diagnosis not present

## 2013-07-11 DIAGNOSIS — I10 Essential (primary) hypertension: Secondary | ICD-10-CM | POA: Diagnosis not present

## 2013-08-09 ENCOUNTER — Encounter (INDEPENDENT_AMBULATORY_CARE_PROVIDER_SITE_OTHER): Payer: Self-pay | Admitting: Surgery

## 2013-08-09 ENCOUNTER — Ambulatory Visit (INDEPENDENT_AMBULATORY_CARE_PROVIDER_SITE_OTHER): Payer: Medicare Other | Admitting: Surgery

## 2013-08-09 VITALS — BP 136/80 | HR 72 | Resp 16 | Ht 62.0 in | Wt 159.0 lb

## 2013-08-09 DIAGNOSIS — Z87898 Personal history of other specified conditions: Secondary | ICD-10-CM

## 2013-08-09 DIAGNOSIS — Z86000 Personal history of in-situ neoplasm of breast: Secondary | ICD-10-CM

## 2013-08-09 NOTE — Progress Notes (Signed)
Subjective:     Patient ID: Jane Wilkerson, female   DOB: Mar 22, 1941, 73 y.o.   MRN: [id number]  HPI She is here for long-term followup of her right breast ductal carcinoma in situ. She appears to be doing well and has no complaints. She has had intermittent nausea with her and hormonal therapy  Review of Systems     Objective:   Physical Exam On exam, there is no right arm swelling. There is no axillary adenopathy. There are no palpable breast masses    Assessment:     Patient stable with a history of DCIS of the right breast     Plan:     She will continue her self examinations and yearly mammograms. I will see her back in one year

## 2013-11-12 DIAGNOSIS — R809 Proteinuria, unspecified: Secondary | ICD-10-CM | POA: Diagnosis not present

## 2013-11-12 DIAGNOSIS — D649 Anemia, unspecified: Secondary | ICD-10-CM | POA: Diagnosis not present

## 2013-11-12 DIAGNOSIS — N189 Chronic kidney disease, unspecified: Secondary | ICD-10-CM | POA: Diagnosis not present

## 2013-11-12 DIAGNOSIS — E559 Vitamin D deficiency, unspecified: Secondary | ICD-10-CM | POA: Diagnosis not present

## 2013-11-12 DIAGNOSIS — Z79899 Other long term (current) drug therapy: Secondary | ICD-10-CM | POA: Diagnosis not present

## 2013-11-14 DIAGNOSIS — E559 Vitamin D deficiency, unspecified: Secondary | ICD-10-CM | POA: Diagnosis not present

## 2013-11-14 DIAGNOSIS — I10 Essential (primary) hypertension: Secondary | ICD-10-CM | POA: Diagnosis not present

## 2013-11-14 DIAGNOSIS — N183 Chronic kidney disease, stage 3 unspecified: Secondary | ICD-10-CM | POA: Diagnosis not present

## 2013-11-16 DIAGNOSIS — E785 Hyperlipidemia, unspecified: Secondary | ICD-10-CM | POA: Diagnosis not present

## 2013-11-16 DIAGNOSIS — Z6828 Body mass index (BMI) 28.0-28.9, adult: Secondary | ICD-10-CM | POA: Diagnosis not present

## 2013-11-16 DIAGNOSIS — B372 Candidiasis of skin and nail: Secondary | ICD-10-CM | POA: Diagnosis not present

## 2013-11-16 DIAGNOSIS — F411 Generalized anxiety disorder: Secondary | ICD-10-CM | POA: Diagnosis not present

## 2014-01-03 ENCOUNTER — Encounter: Payer: Self-pay | Admitting: Internal Medicine

## 2014-01-03 ENCOUNTER — Ambulatory Visit (INDEPENDENT_AMBULATORY_CARE_PROVIDER_SITE_OTHER): Payer: Medicare Other | Admitting: Internal Medicine

## 2014-01-03 VITALS — BP 150/92 | HR 80 | Ht 62.0 in | Wt 157.8 lb

## 2014-01-03 DIAGNOSIS — F411 Generalized anxiety disorder: Secondary | ICD-10-CM | POA: Diagnosis not present

## 2014-01-03 DIAGNOSIS — E785 Hyperlipidemia, unspecified: Secondary | ICD-10-CM | POA: Diagnosis not present

## 2014-01-03 DIAGNOSIS — R002 Palpitations: Secondary | ICD-10-CM

## 2014-01-03 DIAGNOSIS — F419 Anxiety disorder, unspecified: Secondary | ICD-10-CM

## 2014-01-03 NOTE — Patient Instructions (Signed)
Your physician wants you to follow-up in: 1 year. You will receive a reminder letter in the mail two months in advance. If you don't receive a letter, please call our office to schedule the follow-up appointment.  

## 2014-01-03 NOTE — Progress Notes (Signed)
OFFICE NOTE  Chief Complaint:  Routine follow-up  Primary Care Physician: Collene Mares, PA-C  HPI:  Jane Wilkerson  is a 73 year old female with a history of PVCs and bigeminy but had a stress test in May which was negative and that seemed to have resolved. She has had some reflux symptoms as well which improved. Unfortunately over the past year I understand she was diagnosed with breast cancer which is DCIS and underwent local excision and is on chemotherapy. She seems to be doing well and is about a year out from that, and fortunately that was caught early on mammogram. From a cardiac standpoint she denies any chest pain, worsening shortness of breath, palpitations, presyncope or syncopal symptoms.  Recently she had been told that she had some abnormalities with her renal function. She was referred to a nephrologist who she saw a couple times and underwent a 24-hour urine in additional blood work. She was subsequently released. She's not had any recent lipid profile testing not aware of. She continues to have some palpitations although they're not quite as bothersome.  She is seeing Dr. Hinda Lenis, who feels that her renal function is stable. He recently increased her by Bystolic to 20 mg daily.  PMHx:  Past Medical History  Diagnosis Date  . PONV (postoperative nausea and vomiting)   . Hyperlipidemia     takes Zocor daily  . Shortness of breath     with exertion  . Dizziness     occasionally  . Arthritis     back   . Chronic back pain   . Osteoporosis   . History of shingles 2011  . GERD (gastroesophageal reflux disease)     takes Nexium daily  . Gastric ulcer   . Hemorrhoids   . Hx of colonic polyps   . Diverticulosis   . Urinary frequency   . Nocturia   . Hypothyroidism     takes Synthroid daily  . Cataract     right eye  . Anxiety 07/07/2011    takes Ativan every 4hrs  . Hypertension     takes Bystolic and Losartan daily  . Asthma     uses inhaler prn  .  Depression     has appt to discuss depression 07/26/2011  . Nausea alone     onset last Wednesday  08/04/11  . Breast cancer     right - local excision, chemotheraphy  . PVC's (premature ventricular contractions)   . History of nuclear stress test 11/2010    lexiscan; no evidence of inducible ischemia; normal pattern of perfusion     Past Surgical History  Procedure Laterality Date  . Abdominal hysterectomy  2001  . Hemorrhoid surgery    . Colonscopy    . Breast surgery Left 1972    left lumpectomy  . Breast lumpectomy Right 07/02/11    right breast and SNL  . Breast surgery Right 08/11/11    margins on rt breast  . Transthoracic echocardiogram  2009    normal LV & RV systolic function; mild mitral annular calcif & mild MR; trace TR    FAMHx:  Family History  Problem Relation Age of Onset  . Colon cancer Mother     also stroke  . Pancreatic cancer Father     also heart disease  . Anesthesia problems Neg Hx   . Hypotension Neg Hx   . Malignant hyperthermia Neg Hx   . Pseudochol deficiency Neg Hx   . Diabetes  Sister   . Hypertension Sister     SOCHx:   reports that she has never smoked. She has never used smokeless tobacco. She reports that she does not drink alcohol or use illicit drugs.  ALLERGIES:  Allergies  Allergen Reactions  . Hydrocodone Other (See Comments)    Reaction unknown  . Oxycodone Hcl Itching  . Ziac [Bisoprolol-Hydrochlorothiazide] Other (See Comments)    Reaction unknown  . Penicillins Rash    ROS: A comprehensive review of systems was negative except for: Cardiovascular: positive for palpitations Genitourinary: positive for abnromal renal function  HOME MEDS: Current Outpatient Prescriptions  Medication Sig Dispense Refill  . albuterol (PROVENTIL HFA;VENTOLIN HFA) 108 (90 BASE) MCG/ACT inhaler Inhale 2 puffs into the lungs every 6 (six) hours as needed. For shortness of breath      . anastrozole (ARIMIDEX) 1 MG tablet TAKE 1 TABLET DAILY   90 tablet  2  . Calcium Carbonate (CALTRATE 600 PO) Take by mouth daily.      . citalopram (CELEXA) 20 MG tablet Take 20 mg by mouth daily.      Marland Kitchen esomeprazole (NEXIUM) 40 MG capsule Take 40 mg by mouth 2 (two) times daily.       . fish oil-omega-3 fatty acids 1000 MG capsule Take 2 g by mouth daily.       . hydrochlorothiazide (MICROZIDE) 12.5 MG capsule Take 12.5 mg by mouth daily.      . hydrocortisone cream 0.5 % Apply 1 application topically 2 (two) times daily as needed. For hemorrhoid pain      . levothyroxine (SYNTHROID, LEVOTHROID) 50 MCG tablet Take 50 mcg by mouth daily.      Marland Kitchen LORazepam (ATIVAN) 1 MG tablet Take 1 mg by mouth every 6 (six) hours as needed. For anxiety      . losartan (COZAAR) 100 MG tablet Take 100 mg by mouth daily.       . Nebivolol HCl 20 MG TABS Take 1 tablet by mouth daily.      . simvastatin (ZOCOR) 40 MG tablet Take 40 mg by mouth at bedtime.         No current facility-administered medications for this visit.    LABS/IMAGING: No results found for this or any previous visit (from the past 48 hour(s)). No results found.  VITALS: BP 150/92  Pulse 80  Ht 5\' 2"  (1.575 m)  Wt 157 lb 12.8 oz (71.578 kg)  BMI 28.85 kg/m2  EXAM: General appearance: alert and no distress Neck: no carotid bruit and no JVD Lungs: clear to auscultation bilaterally Heart: regular rate and rhythm, S1, S2 normal, no murmur, click, rub or gallop Abdomen: soft, non-tender; bowel sounds normal; no masses,  no organomegaly Extremities: extremities normal, atraumatic, no cyanosis or edema Pulses: 2+ and symmetric Skin: Skin color, texture, turgor normal. No rashes or lesions Neurologic: Grossly normal Psych: Anxious  EKG: Sinus rhythm at 80  ASSESSMENT: 1. Palpitations-generally well controlled 2. Dyslipidemia-due for a cholesterol check 3. Recent history of breast cancer 4. Abnormal renal function by report 5. Mild hypertension  PLAN: 1.   Jane Wilkerson is doing fairly  well.   Her blood pressure is generally well controlled although she recently had an increase in her medication. She has had palpitations however they are improved. Her main issue is ongoing anxiety. She reports her renal function is stable. Overall she is stable from a cardiac standpoint. We can see her back annually or sooner as necessary.  Nadean Corwin  Debara Pickett, MD, [facility] Attending Cardiologist [place] C 01/03/2014, 1:27 PM

## 2014-01-29 ENCOUNTER — Telehealth: Payer: Self-pay | Admitting: Hematology and Oncology

## 2014-01-29 NOTE — Telephone Encounter (Signed)
appt moved from [facility] to [facility]. s/w pt re new appt for lb/VG 05/09/14.

## 2014-02-07 ENCOUNTER — Ambulatory Visit: Payer: Medicare Other | Admitting: Oncology

## 2014-02-07 ENCOUNTER — Other Ambulatory Visit: Payer: Medicare Other

## 2014-02-11 ENCOUNTER — Telehealth: Payer: Self-pay | Admitting: Internal Medicine

## 2014-02-11 MED ORDER — NEBIVOLOL HCL 10 MG PO TABS: 10.0000 mg | ORAL_TABLET | Freq: Every day | ORAL | Status: DC

## 2014-02-11 NOTE — Telephone Encounter (Signed)
That was the right thing to do. Thanks for notifying me.  Dr. Lemmie Evens

## 2014-02-11 NOTE — Telephone Encounter (Signed)
LEFT MESSAGE THAT DR. HILTY AGREED WITH DECREASING THE DOSE OF BYSTOLIC.

## 2014-02-11 NOTE — Telephone Encounter (Signed)
MEDICATION LIST ADJUSTED TO SHOW THE NEW DOSE OF BYSTOLIC

## 2014-02-11 NOTE — Telephone Encounter (Signed)
Patient wanted to let Dr. Debara Pickett know that her blood pressure has started to run low and she has cut back on her Bystolic to 10 mg daily.   Wants to make sure this is what she needs to do.

## 2014-02-11 NOTE — Telephone Encounter (Signed)
Had 3 days last week of not feeling well - increased fatigue and mild lightheadedness.  Checked BP's the lowest being 87/57.  Saturday she decreased the Bystolic to 10mg  daily and states she feels better.  This AM BP 137/87 HR 71.  Wanted Dr. Debara Pickett to be aware and to see if he has any other suggestions. (her kidney doctor increased the Bystolic originally).

## 2014-02-21 ENCOUNTER — Other Ambulatory Visit: Payer: Self-pay | Admitting: Oncology

## 2014-02-21 DIAGNOSIS — C50919 Malignant neoplasm of unspecified site of unspecified female breast: Secondary | ICD-10-CM

## 2014-04-02 ENCOUNTER — Telehealth: Payer: Self-pay | Admitting: *Deleted

## 2014-04-02 NOTE — Telephone Encounter (Signed)
Spoke with patient and confirmed new appointment for 05/06/14 at 230 for labs and 300pm with Dr. Lindi Adie.

## 2014-05-03 ENCOUNTER — Other Ambulatory Visit: Payer: Self-pay | Admitting: *Deleted

## 2014-05-03 DIAGNOSIS — C50419 Malignant neoplasm of upper-outer quadrant of unspecified female breast: Secondary | ICD-10-CM

## 2014-05-04 ENCOUNTER — Other Ambulatory Visit: Payer: Self-pay | Admitting: Adult Health

## 2014-05-06 ENCOUNTER — Ambulatory Visit (HOSPITAL_BASED_OUTPATIENT_CLINIC_OR_DEPARTMENT_OTHER): Payer: Medicare Other | Admitting: Hematology and Oncology

## 2014-05-06 ENCOUNTER — Telehealth: Payer: Self-pay | Admitting: Hematology and Oncology

## 2014-05-06 ENCOUNTER — Other Ambulatory Visit (HOSPITAL_BASED_OUTPATIENT_CLINIC_OR_DEPARTMENT_OTHER): Payer: Medicare Other

## 2014-05-06 VITALS — BP 163/94 | HR 72 | Temp 98.1°F | Resp 20 | Ht 62.0 in | Wt 160.1 lb

## 2014-05-06 DIAGNOSIS — C50411 Malignant neoplasm of upper-outer quadrant of right female breast: Secondary | ICD-10-CM

## 2014-05-06 DIAGNOSIS — C50419 Malignant neoplasm of upper-outer quadrant of unspecified female breast: Secondary | ICD-10-CM

## 2014-05-06 DIAGNOSIS — Z853 Personal history of malignant neoplasm of breast: Secondary | ICD-10-CM | POA: Diagnosis not present

## 2014-05-06 LAB — CBC WITH DIFFERENTIAL/PLATELET
BASO%: 0.9 % (ref 0.0–2.0)
BASOS ABS: 0.1 10*3/uL (ref 0.0–0.1)
EOS%: 2.1 % (ref 0.0–7.0)
Eosinophils Absolute: 0.2 10*3/uL (ref 0.0–0.5)
HEMATOCRIT: 37 % (ref 34.8–46.6)
HGB: 12 g/dL (ref 11.6–15.9)
LYMPH%: 14.9 % (ref 14.0–49.7)
MCH: 28.2 pg (ref 25.1–34.0)
MCHC: 32.3 g/dL (ref 31.5–36.0)
MCV: 87.4 fL (ref 79.5–101.0)
MONO#: 0.8 10*3/uL (ref 0.1–0.9)
MONO%: 10.7 % (ref 0.0–14.0)
NEUT#: 5.6 10*3/uL (ref 1.5–6.5)
NEUT%: 71.4 % (ref 38.4–76.8)
Platelets: 324 10*3/uL (ref 145–400)
RBC: 4.24 10*6/uL (ref 3.70–5.45)
RDW: 14 % (ref 11.2–14.5)
WBC: 7.8 10*3/uL (ref 3.9–10.3)
lymph#: 1.2 10*3/uL (ref 0.9–3.3)

## 2014-05-06 LAB — COMPREHENSIVE METABOLIC PANEL (CC13)
ALBUMIN: 3.9 g/dL (ref 3.5–5.0)
ALT: 15 U/L (ref 0–55)
AST: 14 U/L (ref 5–34)
Alkaline Phosphatase: 69 U/L (ref 40–150)
Anion Gap: 9 mEq/L (ref 3–11)
BUN: 11 mg/dL (ref 7.0–26.0)
CALCIUM: 9.6 mg/dL (ref 8.4–10.4)
CO2: 28 meq/L (ref 22–29)
CREATININE: 1.3 mg/dL — AB (ref 0.6–1.1)
Chloride: 99 mEq/L (ref 98–109)
GLUCOSE: 101 mg/dL (ref 70–140)
POTASSIUM: 4 meq/L (ref 3.5–5.1)
Sodium: 136 mEq/L (ref 136–145)
Total Bilirubin: 0.47 mg/dL (ref 0.20–1.20)
Total Protein: 6.8 g/dL (ref 6.4–8.3)

## 2014-05-06 MED ORDER — ANASTROZOLE 1 MG PO TABS: 1.0000 mg | ORAL_TABLET | Freq: Every day | ORAL | Status: DC

## 2014-05-06 NOTE — Telephone Encounter (Signed)
, °

## 2014-05-06 NOTE — Assessment & Plan Note (Signed)
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 The final pathology revealed a 1.5 cm DCIS that is ER positive PR negative. The patient is currently on Arimidex 1 mg is tolerating it extremely well without any major problems or concerns.  Aromatase inhibitor counseling: I discussed with her that she will need a bone density test every 2 years evaluate for osteoporosis. She does not have any problems with vaginal bleeding. I recommended continuing to stay active with weightbearing exercises.  Surveillance: Today's breast exam was normal. She will be scheduling a mammogram within the next few weeks.  Return to clinic in 6 months for followup.

## 2014-05-06 NOTE — Progress Notes (Signed)
Patient Care Team: Collene Mares, PA-C as PCP - General (Physician Assistant)  DIAGNOSIS: Malignant neoplasm of upper-outer quadrant of right female breast   Primary site: Breast   Staging method: AJCC 7th Edition   Pathologic: Stage 0 (Tis (DCIS), N0, cM0) - Signed by Rulon Eisenmenger, MD on 05/06/2014   Summary: Stage 0 (Tis (DCIS), N0, cM0)  DIAGNOSIS: 73 year old female with ductal carcinoma in situ of the right breast status post lumpectomy performed on 07/02/2011.  PRIOR THERAPY:  #1 patient is status post lumpectomy of the right breast done on 07/02/2011. The final pathology revealed a ductal carcinoma in situ. The tumor was ER +100% PR 0%.  #2 Patient is s/p re-excision of the margins anre final pathology is negative for any residual carcinoma.  #3 patient is status post radiation therapy to the right breast. All of her therapy was [facility] an [facility]. She received a total of 30-33 treatments.  #4 patient will begin Aromasin 25 mg daily starting on 05/17/2013But this was discontinued after 3 months.  #5 patient was then started on Arimidex 1 mg daily one month ago. She is thus far tolerating this  CURRENT THERAPY: Arimidex 1 mg daily  CHIEF COMPLIANT: followup of DCIS  INTERVAL HISTORY: Jane Wilkerson is a82 year old Caucasian with above-mentioned history of DCIS who was diagnosed in December 2012 underwent lumpectomy with the resection the margins followed by radiation and is currently on antiestrogen therapy with Arimidex. She is tolerating Arimidex extremely well without any major problems or concerns. She would like [place] to renew her medication.she denies new lumps or nodules.  REVIEW OF SYSTEMS:   Constitutional: Denies fevers, chills or abnormal weight loss Eyes: Denies blurriness of vision Ears, nose, mouth, throat, and face: Denies mucositis or sore throat Respiratory: Denies cough, dyspnea or wheezes Cardiovascular: Denies  palpitation, chest discomfort or lower extremity swelling Gastrointestinal:  Denies nausea, heartburn or change in bowel habits Skin: Denies abnormal skin rashes Lymphatics: Denies new lymphadenopathy or easy bruising Neurological:Denies numbness, tingling or new weaknesses Behavioral/Psych: Mood is stable, no new changes  Breast:  denies any pain or lumps or nodules in either breasts All other systems were reviewed with the patient and are negative.  I have reviewed the past medical history, past surgical history, social history and family history with the patient and they are unchanged from previous note.  ALLERGIES:  is allergic to hydrocodone; oxycodone hcl; ziac; and penicillins.  MEDICATIONS:  Current Outpatient Prescriptions  Medication Sig Dispense Refill  . albuterol (PROVENTIL HFA;VENTOLIN HFA) 108 (90 BASE) MCG/ACT inhaler Inhale 2 puffs into the lungs every 6 (six) hours as needed. For shortness of breath    . anastrozole (ARIMIDEX) 1 MG tablet Take 1 tablet (1 mg total) by mouth daily. 90 tablet 6  . Calcium Carbonate (CALTRATE 600 PO) Take by mouth daily.    . citalopram (CELEXA) 20 MG tablet Take 20 mg by mouth daily.    Marland Kitchen esomeprazole (NEXIUM) 40 MG capsule Take 40 mg by mouth 2 (two) times daily.     . fish oil-omega-3 fatty acids 1000 MG capsule Take 2 g by mouth daily.     . hydrochlorothiazide (MICROZIDE) 12.5 MG capsule Take 12.5 mg by mouth daily.    . hydrocortisone cream 0.5 % Apply 1 application topically 2 (two) times daily as needed. For hemorrhoid pain    . levothyroxine (SYNTHROID, LEVOTHROID) 50 MCG tablet Take 50 mcg by mouth daily.    Marland Kitchen  LORazepam (ATIVAN) 1 MG tablet Take 1 mg by mouth every 6 (six) hours as needed. For anxiety    . losartan (COZAAR) 100 MG tablet Take 100 mg by mouth daily.     . nebivolol (BYSTOLIC) 10 MG tablet Take 1 tablet (10 mg total) by mouth daily. 30 tablet 6  . simvastatin (ZOCOR) 40 MG tablet Take 40 mg by mouth at bedtime.        No current facility-administered medications for this visit.    PHYSICAL EXAMINATION: ECOG PERFORMANCE STATUS: 0 - Asymptomatic  Filed Vitals:   05/06/14 1431  BP: 163/94  Pulse: 72  Temp: 98.1 F (36.7 C)  Resp: 20   Filed Weights   05/06/14 1431  Weight: 160 lb 1.6 oz (72.621 kg)    GENERAL:alert, no distress and comfortable SKIN: skin color, texture, turgor are normal, no rashes or significant lesions EYES: normal, Conjunctiva are pink and non-injected, sclera clear OROPHARYNX:no exudate, no erythema and lips, buccal mucosa, and tongue normal  NECK: supple, thyroid normal size, non-tender, without nodularity LYMPH:  no palpable lymphadenopathy in the cervical, axillary or inguinal LUNGS: clear to auscultation and percussion with normal breathing effort HEART: regular rate & rhythm and no murmurs and no lower extremity edema ABDOMEN:abdomen soft, non-tender and normal bowel sounds Musculoskeletal:no cyanosis of digits and no clubbing  NEURO: alert & oriented x 3 with fluent speech, no focal motor/sensory deficits BREAST: No palpable masses or nodules in either right or left breasts. No palpable axillary supraclavicular or infraclavicular adenopathy no breast tenderness or nipple discharge.   LABORATORY DATA:  I have reviewed the data as listed   Chemistry      Component Value Date/Time   NA 136 05/06/2014 1415   NA 138 05/11/2013 1021   K 4.0 05/06/2014 1415   K 4.2 05/11/2013 1021   CL 101 05/11/2013 1021   CL 100 08/04/2012 0921   CO2 28 05/06/2014 1415   CO2 32 05/11/2013 1021   BUN 11.0 05/06/2014 1415   BUN 16 05/11/2013 1021   CREATININE 1.3* 05/06/2014 1415   CREATININE 1.32* 05/11/2013 1021   CREATININE 1.20* 11/19/2011 0821      Component Value Date/Time   CALCIUM 9.6 05/06/2014 1415   CALCIUM 9.4 05/11/2013 1021   ALKPHOS 69 05/06/2014 1415   ALKPHOS 59 05/11/2013 1021   AST 14 05/06/2014 1415   AST 12 05/11/2013 1021   ALT 15 05/06/2014 1415    ALT 10 05/11/2013 1021   BILITOT 0.47 05/06/2014 1415   BILITOT 0.4 05/11/2013 1021       Lab Results  Component Value Date   WBC 7.8 05/06/2014   HGB 12.0 05/06/2014   HCT 37.0 05/06/2014   MCV 87.4 05/06/2014   PLT 324 05/06/2014   NEUTROABS 5.6 05/06/2014    ASSESSMENT & PLAN:  Malignant neoplasm of upper-outer quadrant of right female breast High-grade ductal carcinoma in situ of the right breast status post lumpectomy with sentinel node biopsy. The final pathology revealed a 1.5 cm DCIS that is ER positive PR negative. The patient is currently on Arimidex 1 mg is tolerating it extremely well without any major problems or concerns.  Aromatase inhibitor counseling: I discussed with her that she will need a bone density test every 2 years evaluate for osteoporosis. She does not have any problems with vaginal bleeding. I recommended continuing to stay active with weightbearing exercises.  Surveillance: Today's breast exam was normal. She will be scheduling a mammogram within the  next few weeks.  Return to clinic in 6 months for followup.   Orders Placed This Encounter  Procedures  . CBC with Differential    Standing Status: Future     Number of Occurrences:      Standing Expiration Date: 05/06/2015  . Comprehensive metabolic panel (Cmet) - [facility]    Standing Status: Future     Number of Occurrences:      Standing Expiration Date: 05/06/2015   The patient has a good understanding of the overall plan. she agrees with it. She will call with any problems that may develop before her next visit here.  I spent 15 minutes counseling the patient face to face. The total time spent in the appointment was 15 minutes and more than 50% was on counseling and review of test results    Rulon Eisenmenger, MD 05/06/2014 3:13 PM

## 2014-05-07 ENCOUNTER — Other Ambulatory Visit (HOSPITAL_COMMUNITY): Payer: Self-pay | Admitting: Physician Assistant

## 2014-05-07 DIAGNOSIS — Z9889 Other specified postprocedural states: Secondary | ICD-10-CM

## 2014-05-07 DIAGNOSIS — Z09 Encounter for follow-up examination after completed treatment for conditions other than malignant neoplasm: Secondary | ICD-10-CM

## 2014-05-09 ENCOUNTER — Ambulatory Visit: Payer: Medicare Other | Admitting: Hematology and Oncology

## 2014-05-09 ENCOUNTER — Other Ambulatory Visit: Payer: Medicare Other

## 2014-05-16 DIAGNOSIS — N183 Chronic kidney disease, stage 3 (moderate): Secondary | ICD-10-CM | POA: Diagnosis not present

## 2014-05-16 DIAGNOSIS — Z79899 Other long term (current) drug therapy: Secondary | ICD-10-CM | POA: Diagnosis not present

## 2014-05-16 DIAGNOSIS — R809 Proteinuria, unspecified: Secondary | ICD-10-CM | POA: Diagnosis not present

## 2014-05-16 DIAGNOSIS — D649 Anemia, unspecified: Secondary | ICD-10-CM | POA: Diagnosis not present

## 2014-05-16 DIAGNOSIS — E559 Vitamin D deficiency, unspecified: Secondary | ICD-10-CM | POA: Diagnosis not present

## 2014-05-21 DIAGNOSIS — I10 Essential (primary) hypertension: Secondary | ICD-10-CM | POA: Diagnosis not present

## 2014-05-21 DIAGNOSIS — R809 Proteinuria, unspecified: Secondary | ICD-10-CM | POA: Diagnosis not present

## 2014-05-21 DIAGNOSIS — N183 Chronic kidney disease, stage 3 (moderate): Secondary | ICD-10-CM | POA: Diagnosis not present

## 2014-05-28 ENCOUNTER — Ambulatory Visit (HOSPITAL_COMMUNITY)
Admission: RE | Admit: 2014-05-28 | Discharge: 2014-05-28 | Disposition: A | Discharge status: Home / Self Care | Payer: Medicare Other | Source: Ambulatory Visit | Attending: Physician Assistant | Admitting: Physician Assistant

## 2014-05-28 DIAGNOSIS — Z9889 Other specified postprocedural states: Secondary | ICD-10-CM

## 2014-05-28 DIAGNOSIS — Z853 Personal history of malignant neoplasm of breast: Secondary | ICD-10-CM | POA: Diagnosis present

## 2014-05-28 DIAGNOSIS — R928 Other abnormal and inconclusive findings on diagnostic imaging of breast: Secondary | ICD-10-CM | POA: Diagnosis not present

## 2014-06-13 DIAGNOSIS — F419 Anxiety disorder, unspecified: Secondary | ICD-10-CM | POA: Diagnosis not present

## 2014-06-13 DIAGNOSIS — E063 Autoimmune thyroiditis: Secondary | ICD-10-CM | POA: Diagnosis not present

## 2014-06-13 DIAGNOSIS — Z6829 Body mass index (BMI) 29.0-29.9, adult: Secondary | ICD-10-CM | POA: Diagnosis not present

## 2014-06-13 DIAGNOSIS — Z23 Encounter for immunization: Secondary | ICD-10-CM | POA: Diagnosis not present

## 2014-06-13 DIAGNOSIS — E782 Mixed hyperlipidemia: Secondary | ICD-10-CM | POA: Diagnosis not present

## 2014-08-09 DIAGNOSIS — C50919 Malignant neoplasm of unspecified site of unspecified female breast: Secondary | ICD-10-CM | POA: Diagnosis not present

## 2014-09-26 DIAGNOSIS — I10 Essential (primary) hypertension: Secondary | ICD-10-CM | POA: Diagnosis not present

## 2014-09-26 DIAGNOSIS — N183 Chronic kidney disease, stage 3 (moderate): Secondary | ICD-10-CM | POA: Diagnosis not present

## 2014-09-26 DIAGNOSIS — D649 Anemia, unspecified: Secondary | ICD-10-CM | POA: Diagnosis not present

## 2014-09-26 DIAGNOSIS — E559 Vitamin D deficiency, unspecified: Secondary | ICD-10-CM | POA: Diagnosis not present

## 2014-09-26 DIAGNOSIS — Z79899 Other long term (current) drug therapy: Secondary | ICD-10-CM | POA: Diagnosis not present

## 2014-09-26 DIAGNOSIS — R809 Proteinuria, unspecified: Secondary | ICD-10-CM | POA: Diagnosis not present

## 2014-10-02 DIAGNOSIS — N183 Chronic kidney disease, stage 3 (moderate): Secondary | ICD-10-CM | POA: Diagnosis not present

## 2014-10-02 DIAGNOSIS — I10 Essential (primary) hypertension: Secondary | ICD-10-CM | POA: Diagnosis not present

## 2014-11-06 NOTE — Assessment & Plan Note (Signed)
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 The final pathology revealed a 1.5 cm DCIS that is ER positive PR negative on Arimidex Since May 2013  Aromatase inhibitor counseling: Tolerating anastrozole very well  Breast Cancer Surveillance: 1. Breast exam 11/07/14: Normal 2. Mammogram 05/28/14 No abnormalities. Postsurgical changes. Breast Density Category B. I recommended that she get 3-D mammograms for surveillance. Discussed the differences between different breast density categories.   Return to clinic in 6 months for followup.

## 2014-11-07 ENCOUNTER — Telehealth: Payer: Self-pay | Admitting: Hematology and Oncology

## 2014-11-07 ENCOUNTER — Ambulatory Visit (HOSPITAL_BASED_OUTPATIENT_CLINIC_OR_DEPARTMENT_OTHER): Payer: Medicare Other | Admitting: Hematology and Oncology

## 2014-11-07 ENCOUNTER — Other Ambulatory Visit (HOSPITAL_BASED_OUTPATIENT_CLINIC_OR_DEPARTMENT_OTHER): Payer: Medicare Other

## 2014-11-07 VITALS — BP 147/81 | HR 74 | Temp 98.7°F | Resp 18 | Ht 62.0 in | Wt 162.3 lb

## 2014-11-07 DIAGNOSIS — C50411 Malignant neoplasm of upper-outer quadrant of right female breast: Secondary | ICD-10-CM

## 2014-11-07 DIAGNOSIS — Z170 Estrogen receptor positive status [ER+]: Secondary | ICD-10-CM

## 2014-11-07 DIAGNOSIS — D0511 Intraductal carcinoma in situ of right breast: Secondary | ICD-10-CM | POA: Diagnosis not present

## 2014-11-07 DIAGNOSIS — C50419 Malignant neoplasm of upper-outer quadrant of unspecified female breast: Secondary | ICD-10-CM

## 2014-11-07 LAB — CBC WITH DIFFERENTIAL/PLATELET
BASO%: 0.6 % (ref 0.0–2.0)
BASOS ABS: 0.1 10*3/uL (ref 0.0–0.1)
EOS%: 2.6 % (ref 0.0–7.0)
Eosinophils Absolute: 0.2 10*3/uL (ref 0.0–0.5)
HEMATOCRIT: 35.8 % (ref 34.8–46.6)
HGB: 11.8 g/dL (ref 11.6–15.9)
LYMPH%: 13.1 % — ABNORMAL LOW (ref 14.0–49.7)
MCH: 29 pg (ref 25.1–34.0)
MCHC: 33 g/dL (ref 31.5–36.0)
MCV: 88 fL (ref 79.5–101.0)
MONO#: 0.9 10*3/uL (ref 0.1–0.9)
MONO%: 11.8 % (ref 0.0–14.0)
NEUT%: 71.9 % (ref 38.4–76.8)
NEUTROS ABS: 5.5 10*3/uL (ref 1.5–6.5)
PLATELETS: 286 10*3/uL (ref 145–400)
RBC: 4.07 10*6/uL (ref 3.70–5.45)
RDW: 14.1 % (ref 11.2–14.5)
WBC: 7.7 10*3/uL (ref 3.9–10.3)
lymph#: 1 10*3/uL (ref 0.9–3.3)

## 2014-11-07 LAB — COMPREHENSIVE METABOLIC PANEL (CC13)
ALBUMIN: 3.8 g/dL (ref 3.5–5.0)
ALT: 11 U/L (ref 0–55)
ANION GAP: 13 meq/L — AB (ref 3–11)
AST: 13 U/L (ref 5–34)
Alkaline Phosphatase: 67 U/L (ref 40–150)
BUN: 12.9 mg/dL (ref 7.0–26.0)
CALCIUM: 9.1 mg/dL (ref 8.4–10.4)
CHLORIDE: 100 meq/L (ref 98–109)
CO2: 26 meq/L (ref 22–29)
Creatinine: 1.2 mg/dL — ABNORMAL HIGH (ref 0.6–1.1)
EGFR: 43 mL/min/{1.73_m2} — ABNORMAL LOW (ref >90)
GLUCOSE: 110 mg/dL (ref 70–140)
POTASSIUM: 4.3 meq/L (ref 3.5–5.1)
SODIUM: 139 meq/L (ref 136–145)
Total Bilirubin: 0.41 mg/dL (ref 0.20–1.20)
Total Protein: 6.7 g/dL (ref 6.4–8.3)

## 2014-11-07 NOTE — Telephone Encounter (Signed)
Gave avs & calendar for May 2017 °

## 2014-11-07 NOTE — Progress Notes (Signed)
Patient Care Team: Cory Munch, PA-C as PCP - General (Physician Assistant)  DIAGNOSIS: Malignant neoplasm of upper-outer quadrant of right female breast   Staging form: Breast, AJCC 7th Edition     Pathologic: Stage 0 (Tis (DCIS), N0, cM0) - Signed by Rulon Eisenmenger, MD on 05/06/2014   SUMMARY OF ONCOLOGIC HISTORY:   Malignant neoplasm of upper-outer quadrant of right female breast   07/02/2011 Surgery Right lumpectomy: DCIS ER 100%, PR 0%   07/23/2011 - 08/20/2011 Radiation Therapy Adjuvant XRT   11/19/2011 -  Anti-estrogen oral therapy Aromasin 25 mg daily X 1 month then changed to Arimidex 1 mg daily    CHIEF COMPLIANT: Follow-up of breast cancer on Arimidex  INTERVAL HISTORY: Jane Wilkerson is a 74 year old with above-mentioned history of right breast DCIS treated with lumpectomy and radiation and is currently on Arimidex for adjuvant therapy and is tolerating it extremely well without any major problems or concerns. She reports that she has lack of energy and stents to sleep a lot and takes care of her family members but does not do any exercise.   REVIEW OF SYSTEMS:   Constitutional: Denies fevers, chills or abnormal weight loss Eyes: Denies blurriness of vision Ears, nose, mouth, throat, and face: Denies mucositis or sore throat Respiratory: Denies cough, dyspnea or wheezes Cardiovascular: Denies palpitation, chest discomfort or lower extremity swelling Gastrointestinal:  Denies nausea, heartburn or change in bowel habits Skin: Denies abnormal skin rashes Lymphatics: Denies new lymphadenopathy or easy bruising Neurological:Denies numbness, tingling or new weaknesses Behavioral/Psych: Mood is stable, no new changes  Breast:  denies any pain or lumps or nodules in either breasts All other systems were reviewed with the patient and are negative.  I have reviewed the past medical history, past surgical history, social history and family history with the patient and they are  unchanged from previous note.  ALLERGIES:  is allergic to hydrocodone; oxycodone hcl; ziac; and penicillins.  MEDICATIONS:  Current Outpatient Prescriptions  Medication Sig Dispense Refill  . albuterol (PROVENTIL HFA;VENTOLIN HFA) 108 (90 BASE) MCG/ACT inhaler Inhale 2 puffs into the lungs every 6 (six) hours as needed. For shortness of breath    . anastrozole (ARIMIDEX) 1 MG tablet Take 1 tablet (1 mg total) by mouth daily. 90 tablet 6  . Calcium Carbonate (CALTRATE 600 PO) Take by mouth daily.    . citalopram (CELEXA) 20 MG tablet Take 20 mg by mouth daily.    Marland Kitchen esomeprazole (NEXIUM) 40 MG capsule Take 40 mg by mouth 2 (two) times daily.     . fish oil-omega-3 fatty acids 1000 MG capsule Take 2 g by mouth daily.     . hydrochlorothiazide (MICROZIDE) 12.5 MG capsule Take 12.5 mg by mouth daily.    . hydrocortisone cream 0.5 % Apply 1 application topically 2 (two) times daily as needed. For hemorrhoid pain    . levothyroxine (SYNTHROID, LEVOTHROID) 50 MCG tablet Take 50 mcg by mouth daily.    Marland Kitchen LORazepam (ATIVAN) 1 MG tablet Take 1 mg by mouth every 6 (six) hours as needed. For anxiety    . losartan (COZAAR) 100 MG tablet Take 100 mg by mouth daily.     . nebivolol (BYSTOLIC) 10 MG tablet Take 1 tablet (10 mg total) by mouth daily. 30 tablet 6  . simvastatin (ZOCOR) 40 MG tablet Take 40 mg by mouth at bedtime.       No current facility-administered medications for this visit.    PHYSICAL EXAMINATION: ECOG  PERFORMANCE STATUS: 1 - Symptomatic but completely ambulatory  Filed Vitals:   11/07/14 1102  BP: 147/81  Pulse: 74  Temp: 98.7 F (37.1 C)  Resp: 18   Filed Weights   11/07/14 1102  Weight: 162 lb 4.8 oz (73.619 kg)    GENERAL:alert, no distress and comfortable SKIN: skin color, texture, turgor are normal, no rashes or significant lesions EYES: normal, Conjunctiva are pink and non-injected, sclera clear OROPHARYNX:no exudate, no erythema and lips, buccal mucosa, and  tongue normal  NECK: supple, thyroid normal size, non-tender, without nodularity LYMPH:  no palpable lymphadenopathy in the cervical, axillary or inguinal LUNGS: clear to auscultation and percussion with normal breathing effort HEART: regular rate & rhythm and no murmurs and no lower extremity edema ABDOMEN:abdomen soft, non-tender and normal bowel sounds Musculoskeletal:no cyanosis of digits and no clubbing  NEURO: alert & oriented x 3 with fluent speech, no focal motor/sensory deficits BREAST: No palpable masses or nodules in either right or left breasts. No palpable axillary supraclavicular or infraclavicular adenopathy no breast tenderness or nipple discharge. (exam performed in the presence of a chaperone)  LABORATORY DATA:  I have reviewed the data as listed   Chemistry      Component Value Date/Time   NA 139 11/07/2014 1021   NA 138 05/11/2013 1021   K 4.3 11/07/2014 1021   K 4.2 05/11/2013 1021   CL 101 05/11/2013 1021   CL 100 08/04/2012 0921   CO2 26 11/07/2014 1021   CO2 32 05/11/2013 1021   BUN 12.9 11/07/2014 1021   BUN 16 05/11/2013 1021   CREATININE 1.2* 11/07/2014 1021   CREATININE 1.32* 05/11/2013 1021   CREATININE 1.20* 11/19/2011 0821      Component Value Date/Time   CALCIUM 9.1 11/07/2014 1021   CALCIUM 9.4 05/11/2013 1021   ALKPHOS 67 11/07/2014 1021   ALKPHOS 59 05/11/2013 1021   AST 13 11/07/2014 1021   AST 12 05/11/2013 1021   ALT 11 11/07/2014 1021   ALT 10 05/11/2013 1021   BILITOT 0.41 11/07/2014 1021   BILITOT 0.4 05/11/2013 1021       Lab Results  Component Value Date   WBC 7.7 11/07/2014   HGB 11.8 11/07/2014   HCT 35.8 11/07/2014   MCV 88.0 11/07/2014   PLT 286 11/07/2014   NEUTROABS 5.5 11/07/2014     RADIOGRAPHIC STUDIES: I have personally reviewed the radiology reports and agreed with their findings. Mammograms November 2015 are normal  ASSESSMENT & PLAN:  Malignant neoplasm of upper-outer quadrant of right female  breast High-grade ductal carcinoma in situ of the right breast status post lumpectomy with sentinel node biopsy. The final pathology revealed a 1.5 cm DCIS that is ER positive PR negative on Arimidex Since May 2013  Aromatase inhibitor counseling: Tolerating anastrozole very well  Breast Cancer Surveillance: 1. Breast exam 11/07/14: Normal 2. Mammogram 05/28/14 No abnormalities. Postsurgical changes. Breast Density Category B. I recommended that she get 3-D mammograms for surveillance. Discussed the differences between different breast density categories.   Survivorship: I provided her with the live strong [employer] program to get her doctor in physical exercise.  Return to clinic in 6 months for followup.   No orders of the defined types were placed in this encounter.   The patient has a good understanding of the overall plan. she agrees with it. she will call with any problems that may develop before the next visit here.   Rulon Eisenmenger, MD

## 2014-11-19 ENCOUNTER — Ambulatory Visit (HOSPITAL_COMMUNITY)
Admission: RE | Admit: 2014-11-19 | Discharge: 2014-11-19 | Disposition: A | Discharge status: Home / Self Care | Payer: Medicare Other | Source: Ambulatory Visit | Attending: Physician Assistant | Admitting: Physician Assistant

## 2014-11-19 ENCOUNTER — Other Ambulatory Visit (HOSPITAL_COMMUNITY): Payer: Self-pay | Admitting: Physician Assistant

## 2014-11-19 DIAGNOSIS — R0602 Shortness of breath: Secondary | ICD-10-CM | POA: Diagnosis not present

## 2014-11-19 DIAGNOSIS — Z23 Encounter for immunization: Secondary | ICD-10-CM | POA: Diagnosis not present

## 2014-11-19 DIAGNOSIS — R739 Hyperglycemia, unspecified: Secondary | ICD-10-CM

## 2014-11-19 DIAGNOSIS — Z923 Personal history of irradiation: Secondary | ICD-10-CM | POA: Diagnosis not present

## 2014-11-19 DIAGNOSIS — R05 Cough: Secondary | ICD-10-CM | POA: Diagnosis not present

## 2014-11-19 DIAGNOSIS — Z0000 Encounter for general adult medical examination without abnormal findings: Secondary | ICD-10-CM

## 2014-11-19 DIAGNOSIS — E782 Mixed hyperlipidemia: Secondary | ICD-10-CM | POA: Diagnosis not present

## 2014-11-19 DIAGNOSIS — Z853 Personal history of malignant neoplasm of breast: Secondary | ICD-10-CM | POA: Insufficient documentation

## 2014-11-19 DIAGNOSIS — Z1389 Encounter for screening for other disorder: Secondary | ICD-10-CM | POA: Diagnosis not present

## 2014-11-19 DIAGNOSIS — R7309 Other abnormal glucose: Secondary | ICD-10-CM | POA: Diagnosis not present

## 2014-11-27 ENCOUNTER — Other Ambulatory Visit (HOSPITAL_COMMUNITY): Payer: Self-pay | Admitting: Physician Assistant

## 2014-11-27 DIAGNOSIS — Z780 Asymptomatic menopausal state: Secondary | ICD-10-CM

## 2014-11-27 DIAGNOSIS — M8580 Other specified disorders of bone density and structure, unspecified site: Secondary | ICD-10-CM

## 2014-11-28 ENCOUNTER — Encounter (INDEPENDENT_AMBULATORY_CARE_PROVIDER_SITE_OTHER): Payer: Self-pay | Admitting: *Deleted

## 2014-12-05 ENCOUNTER — Ambulatory Visit (HOSPITAL_COMMUNITY)
Admission: RE | Admit: 2014-12-05 | Discharge: 2014-12-05 | Disposition: A | Discharge status: Home / Self Care | Payer: Medicare Other | Source: Ambulatory Visit | Attending: Physician Assistant | Admitting: Physician Assistant

## 2014-12-05 DIAGNOSIS — M8580 Other specified disorders of bone density and structure, unspecified site: Secondary | ICD-10-CM | POA: Diagnosis not present

## 2014-12-05 DIAGNOSIS — Z780 Asymptomatic menopausal state: Secondary | ICD-10-CM | POA: Insufficient documentation

## 2014-12-05 DIAGNOSIS — M818 Other osteoporosis without current pathological fracture: Secondary | ICD-10-CM | POA: Insufficient documentation

## 2014-12-05 DIAGNOSIS — M8589 Other specified disorders of bone density and structure, multiple sites: Secondary | ICD-10-CM | POA: Diagnosis not present

## 2015-02-25 ENCOUNTER — Other Ambulatory Visit (HOSPITAL_COMMUNITY): Payer: Self-pay | Admitting: Physician Assistant

## 2015-02-25 DIAGNOSIS — J189 Pneumonia, unspecified organism: Secondary | ICD-10-CM

## 2015-04-28 ENCOUNTER — Other Ambulatory Visit (HOSPITAL_COMMUNITY): Payer: Self-pay | Admitting: Physician Assistant

## 2015-04-28 DIAGNOSIS — Z9889 Other specified postprocedural states: Secondary | ICD-10-CM

## 2015-04-28 DIAGNOSIS — Z1231 Encounter for screening mammogram for malignant neoplasm of breast: Secondary | ICD-10-CM

## 2015-05-08 DIAGNOSIS — E782 Mixed hyperlipidemia: Secondary | ICD-10-CM | POA: Diagnosis not present

## 2015-05-08 DIAGNOSIS — I10 Essential (primary) hypertension: Secondary | ICD-10-CM | POA: Diagnosis not present

## 2015-05-08 DIAGNOSIS — Z6829 Body mass index (BMI) 29.0-29.9, adult: Secondary | ICD-10-CM | POA: Diagnosis not present

## 2015-05-08 DIAGNOSIS — Z23 Encounter for immunization: Secondary | ICD-10-CM | POA: Diagnosis not present

## 2015-05-08 DIAGNOSIS — R319 Hematuria, unspecified: Secondary | ICD-10-CM | POA: Diagnosis not present

## 2015-05-08 DIAGNOSIS — F329 Major depressive disorder, single episode, unspecified: Secondary | ICD-10-CM | POA: Diagnosis not present

## 2015-05-08 DIAGNOSIS — Z1389 Encounter for screening for other disorder: Secondary | ICD-10-CM | POA: Diagnosis not present

## 2015-05-26 ENCOUNTER — Other Ambulatory Visit: Payer: Self-pay | Admitting: Hematology and Oncology

## 2015-05-26 DIAGNOSIS — C50411 Malignant neoplasm of upper-outer quadrant of right female breast: Secondary | ICD-10-CM

## 2015-06-03 ENCOUNTER — Ambulatory Visit (HOSPITAL_COMMUNITY)
Admission: RE | Admit: 2015-06-03 | Discharge: 2015-06-03 | Disposition: A | Discharge status: Home / Self Care | Payer: Medicare Other | Source: Ambulatory Visit | Attending: Physician Assistant | Admitting: Physician Assistant

## 2015-06-03 DIAGNOSIS — Z9889 Other specified postprocedural states: Secondary | ICD-10-CM

## 2015-06-03 DIAGNOSIS — R928 Other abnormal and inconclusive findings on diagnostic imaging of breast: Secondary | ICD-10-CM | POA: Diagnosis not present

## 2015-06-03 DIAGNOSIS — Z853 Personal history of malignant neoplasm of breast: Secondary | ICD-10-CM | POA: Insufficient documentation

## 2015-08-12 DIAGNOSIS — Z853 Personal history of malignant neoplasm of breast: Secondary | ICD-10-CM | POA: Diagnosis not present

## 2015-08-15 ENCOUNTER — Ambulatory Visit (INDEPENDENT_AMBULATORY_CARE_PROVIDER_SITE_OTHER): Payer: Medicare Other | Admitting: Internal Medicine

## 2015-08-15 ENCOUNTER — Encounter: Payer: Self-pay | Admitting: Internal Medicine

## 2015-08-15 VITALS — BP 162/98 | HR 76 | Ht 62.0 in | Wt 160.3 lb

## 2015-08-15 DIAGNOSIS — R0602 Shortness of breath: Secondary | ICD-10-CM | POA: Diagnosis not present

## 2015-08-15 DIAGNOSIS — I959 Hypotension, unspecified: Secondary | ICD-10-CM

## 2015-08-15 DIAGNOSIS — R0789 Other chest pain: Secondary | ICD-10-CM

## 2015-08-15 DIAGNOSIS — R002 Palpitations: Secondary | ICD-10-CM

## 2015-08-15 DIAGNOSIS — R079 Chest pain, unspecified: Secondary | ICD-10-CM | POA: Diagnosis not present

## 2015-08-15 DIAGNOSIS — R0609 Other forms of dyspnea: Secondary | ICD-10-CM | POA: Diagnosis not present

## 2015-08-15 DIAGNOSIS — F419 Anxiety disorder, unspecified: Secondary | ICD-10-CM

## 2015-08-15 DIAGNOSIS — R5383 Other fatigue: Secondary | ICD-10-CM | POA: Diagnosis not present

## 2015-08-15 NOTE — Progress Notes (Signed)
OFFICE NOTE  Chief Complaint:  Chest pressure, progressive DOE  Primary Care Physician: Jane Mares, PA-C  HPI:  Jane Wilkerson  is a 76 year old female with a history of PVCs and bigeminy but had a stress test in May which was negative and that seemed to have resolved. She has had some reflux symptoms as well which improved. Unfortunately over the past year I understand she was diagnosed with breast cancer which is DCIS and underwent local excision and is on chemotherapy. She seems to be doing well and is about a year out from that, and fortunately that was caught early on mammogram. From a cardiac standpoint she denies any chest pain, worsening shortness of breath, palpitations, presyncope or syncopal symptoms.  Recently she had been told that she had some abnormalities with her renal function. She was referred to a nephrologist who she saw a couple times and underwent a 24-hour urine in additional blood work. She was subsequently released. She's not had any recent lipid profile testing not aware of. She continues to have some palpitations although they're not quite as bothersome.  She is seeing Dr. Hinda Wilkerson, who feels that her renal function is stable. He recently increased her by Bystolic to 20 mg daily.  Jane Wilkerson returns today for follow-up. Recently she's been a little more anxious and she's noted some lability in her blood pressures. At times her blood pressure was as low as 80/50 but can run high. She's also felt some fatigue, some aching or tightness in her chest, particularly with sitting up at night and some progressive increase in shortness of breath. Blood pressure today was elevated in the office 162/98. She's recently had some medication timing adjustments by her primary care provider.  PMHx:  Past Medical History  Diagnosis Date  . PONV (postoperative nausea and vomiting)   . Hyperlipidemia     takes Zocor daily  . Shortness of breath     with exertion  . Dizziness       occasionally  . Arthritis     back   . Chronic back pain   . Osteoporosis   . History of shingles 2011  . GERD (gastroesophageal reflux disease)     takes Nexium daily  . Gastric ulcer   . Hemorrhoids   . Hx of colonic polyps   . Diverticulosis   . Urinary frequency   . Nocturia   . Hypothyroidism     takes Synthroid daily  . Cataract     right eye  . Anxiety 07/07/2011    takes Ativan every 4hrs  . Hypertension     takes Bystolic and Losartan daily  . Asthma     uses inhaler prn  . Depression     has appt to discuss depression 07/26/2011  . Nausea alone     onset last Wednesday  08/04/11  . Breast cancer ([place])     right - local excision, chemotheraphy  . PVC's (premature ventricular contractions)   . History of nuclear stress test 11/2010    lexiscan; no evidence of inducible ischemia; normal pattern of perfusion     Past Surgical History  Procedure Laterality Date  . Abdominal hysterectomy  2001  . Hemorrhoid surgery    . Colonscopy    . Breast surgery Left 1972    left lumpectomy  . Breast lumpectomy Right 07/02/11    right breast and SNL  . Breast surgery Right 08/11/11    margins on rt breast  .  Transthoracic echocardiogram  2009    normal LV & RV systolic function; mild mitral annular calcif & mild MR; trace TR    FAMHx:  Family History  Problem Relation Age of Onset  . Colon cancer Mother     also stroke  . Pancreatic cancer Father     also heart disease  . Anesthesia problems Neg Hx   . Hypotension Neg Hx   . Malignant hyperthermia Neg Hx   . Pseudochol deficiency Neg Hx   . Diabetes Sister   . Hypertension Sister     SOCHx:   reports that she has never smoked. She has never used smokeless tobacco. She reports that she does not drink alcohol or use illicit drugs.  ALLERGIES:  Allergies  Allergen Reactions  . Hydrocodone Other (See Comments)    Reaction unknown  . Oxycodone Hcl Itching  . Ziac [Bisoprolol-Hydrochlorothiazide] Other (See  Comments)    Reaction unknown  . Penicillins Rash    ROS: A comprehensive review of systems was negative except for: Cardiovascular: positive for chest pressure/discomfort, dyspnea, fatigue and Labile blood pressure  HOME MEDS: Current Outpatient Prescriptions  Medication Sig Dispense Refill  . albuterol (PROVENTIL HFA;VENTOLIN HFA) 108 (90 BASE) MCG/ACT inhaler Inhale 2 puffs into the lungs every 6 (six) hours as needed. For shortness of breath    . alendronate (FOSAMAX) 70 MG tablet Take 1 tablet by mouth once a week.    Marland Kitchen anastrozole (ARIMIDEX) 1 MG tablet TAKE 1 TABLET DAILY 90 tablet 3  . Calcium Carbonate (CALTRATE 600 PO) Take by mouth daily.    . citalopram (CELEXA) 20 MG tablet Take 20 mg by mouth daily.    Marland Kitchen esomeprazole (NEXIUM) 40 MG capsule Take 40 mg by mouth 2 (two) times daily.     . fish oil-omega-3 fatty acids 1000 MG capsule Take 2 g by mouth daily.     . hydrochlorothiazide (MICROZIDE) 12.5 MG capsule Take 12.5 mg by mouth daily.    . hydrocortisone cream 0.5 % Apply 1 application topically 2 (two) times daily as needed. For hemorrhoid pain    . levothyroxine (SYNTHROID, LEVOTHROID) 50 MCG tablet Take 50 mcg by mouth daily.    Marland Kitchen LORazepam (ATIVAN) 1 MG tablet Take 1 mg by mouth every 6 (six) hours as needed. For anxiety    . losartan (COZAAR) 100 MG tablet Take 100 mg by mouth daily.     . nebivolol (BYSTOLIC) 10 MG tablet Take 1 tablet (10 mg total) by mouth daily. 30 tablet 6  . simvastatin (ZOCOR) 40 MG tablet Take 40 mg by mouth at bedtime.       No current facility-administered medications for this visit.    LABS/IMAGING: No results found for this or any previous visit (from the past 48 hour(s)). No results found.  VITALS: BP 162/98 mmHg  Pulse 76  Ht 5\' 2"  (1.575 m)  Wt 160 lb 4.8 oz (72.712 kg)  BMI 29.31 kg/m2  EXAM: General appearance: alert and no distress Neck: no carotid bruit and no JVD Lungs: clear to auscultation bilaterally Heart:  regular rate and rhythm, S1, S2 normal, no murmur, click, rub or gallop Abdomen: soft, non-tender; bowel sounds normal; no masses,  no organomegaly Extremities: extremities normal, atraumatic, no cyanosis or edema Pulses: 2+ and symmetric Skin: Skin color, texture, turgor normal. No rashes or lesions Neurologic: Grossly normal Psych: Anxious  EKG: Normal sinus rhythm at 76, poor R-wave progression anteriorly  ASSESSMENT: 1. Chest pressure/dyspnea on exertion 2.  Palpitations-generally well controlled 3. Dyslipidemia-due for a cholesterol check 4. Recent history of breast cancer 5. Abnormal renal function by report 6. Labile hypertension 7. Anxiety  PLAN: 1.   Ms. Meriwether reports some recent chest pressure and labile hypertension. He has been under some significant stress and has some anxiety. Interestingly she's had low blood pressures including one reading of 80/50. She felt very fatigued with this. She says doing normal activities causes her easy fatigue including even getting dressed. Blood pressure is actually high today. I recommend a stress test and echocardiogram to assess LV function and coronary perfusion. We may need to adjust her medications to allow blood pressure to run higher and avoid hypotensive episodes. It does not sound like she's having arrhythmias for which she is symptomatic.  Plan to see her back in a few weeks to discuss those results.   Pixie Casino, MD, [facility] Attending Cardiologist [place] 08/15/2015, 1:29 PM

## 2015-08-15 NOTE — Patient Instructions (Addendum)
Your physician has requested that you have an echocardiogram @ [address] - [address]rd Floor. Echocardiography is a painless test that uses sound waves to create images of your heart. It provides your doctor with information about the size and shape of your heart and how well your heart's chambers and valves are working. This procedure takes approximately one hour. There are no restrictions for this procedure.  Your physician has requested that you have a lexiscan myoview. For further information please visit [email or web address]. Please follow instruction sheet, as given.    Your physician recommends that you schedule a follow-up appointment after your tests with Dr. Debara Pickett.

## 2015-08-22 ENCOUNTER — Telehealth (HOSPITAL_COMMUNITY): Payer: Self-pay

## 2015-08-22 NOTE — Telephone Encounter (Signed)
Encounter complete. 

## 2015-08-26 ENCOUNTER — Ambulatory Visit (HOSPITAL_COMMUNITY)
Admission: RE | Admit: 2015-08-26 | Discharge: 2015-08-26 | Disposition: A | Discharge status: Home / Self Care | Payer: Medicare Other | Source: Ambulatory Visit | Attending: Cardiovascular Disease | Admitting: Cardiovascular Disease

## 2015-08-26 DIAGNOSIS — Z6829 Body mass index (BMI) 29.0-29.9, adult: Secondary | ICD-10-CM | POA: Insufficient documentation

## 2015-08-26 DIAGNOSIS — R002 Palpitations: Secondary | ICD-10-CM | POA: Diagnosis not present

## 2015-08-26 DIAGNOSIS — I959 Hypotension, unspecified: Secondary | ICD-10-CM | POA: Insufficient documentation

## 2015-08-26 DIAGNOSIS — R42 Dizziness and giddiness: Secondary | ICD-10-CM | POA: Insufficient documentation

## 2015-08-26 DIAGNOSIS — E663 Overweight: Secondary | ICD-10-CM | POA: Diagnosis not present

## 2015-08-26 DIAGNOSIS — R079 Chest pain, unspecified: Secondary | ICD-10-CM | POA: Diagnosis not present

## 2015-08-26 DIAGNOSIS — R0609 Other forms of dyspnea: Secondary | ICD-10-CM | POA: Insufficient documentation

## 2015-08-26 DIAGNOSIS — I10 Essential (primary) hypertension: Secondary | ICD-10-CM | POA: Insufficient documentation

## 2015-08-26 DIAGNOSIS — R5383 Other fatigue: Secondary | ICD-10-CM

## 2015-08-26 DIAGNOSIS — R0602 Shortness of breath: Secondary | ICD-10-CM | POA: Diagnosis not present

## 2015-08-26 LAB — MYOCARDIAL PERFUSION IMAGING
CHL CUP RESTING HR STRESS: 68 {beats}/min
CSEPPHR: 102 {beats}/min
LV dias vol: 60 mL
LVSYSVOL: 23 mL
NUC STRESS TID: 0.99
SDS: 4
SRS: 2
SSS: 6

## 2015-08-26 MED ORDER — AMINOPHYLLINE 25 MG/ML IV SOLN
75.0000 mg | Freq: Once | INTRAVENOUS | Status: AC
  Administered 2015-08-26: 75 mg via INTRAVENOUS

## 2015-08-26 MED ORDER — REGADENOSON 0.4 MG/5ML IV SOLN
0.4000 mg | Freq: Once | INTRAVENOUS | Status: AC
  Administered 2015-08-26: 0.4 mg via INTRAVENOUS

## 2015-08-26 MED ORDER — TECHNETIUM TC 99M SESTAMIBI GENERIC - CARDIOLITE
10.1000 | Freq: Once | INTRAVENOUS | Status: AC | PRN
  Administered 2015-08-26: 10.1 via INTRAVENOUS

## 2015-08-26 MED ORDER — TECHNETIUM TC 99M SESTAMIBI GENERIC - CARDIOLITE
30.7000 | Freq: Once | INTRAVENOUS | Status: AC | PRN
  Administered 2015-08-26: 30.7 via INTRAVENOUS

## 2015-08-29 ENCOUNTER — Other Ambulatory Visit: Payer: Self-pay

## 2015-08-29 ENCOUNTER — Ambulatory Visit (HOSPITAL_COMMUNITY): Payer: Medicare Other | Attending: Cardiovascular Disease

## 2015-08-29 DIAGNOSIS — I77810 Thoracic aortic ectasia: Secondary | ICD-10-CM | POA: Insufficient documentation

## 2015-08-29 DIAGNOSIS — I119 Hypertensive heart disease without heart failure: Secondary | ICD-10-CM | POA: Diagnosis not present

## 2015-08-29 DIAGNOSIS — R0602 Shortness of breath: Secondary | ICD-10-CM | POA: Diagnosis not present

## 2015-08-29 DIAGNOSIS — R079 Chest pain, unspecified: Secondary | ICD-10-CM | POA: Insufficient documentation

## 2015-08-29 DIAGNOSIS — E785 Hyperlipidemia, unspecified: Secondary | ICD-10-CM | POA: Insufficient documentation

## 2015-08-29 DIAGNOSIS — I071 Rheumatic tricuspid insufficiency: Secondary | ICD-10-CM | POA: Diagnosis not present

## 2015-09-05 ENCOUNTER — Encounter: Payer: Self-pay | Admitting: Internal Medicine

## 2015-09-05 ENCOUNTER — Ambulatory Visit (INDEPENDENT_AMBULATORY_CARE_PROVIDER_SITE_OTHER): Payer: Medicare Other | Admitting: Internal Medicine

## 2015-09-05 VITALS — BP 168/102 | HR 78 | Ht 62.0 in | Wt 161.0 lb

## 2015-09-05 DIAGNOSIS — R0609 Other forms of dyspnea: Secondary | ICD-10-CM

## 2015-09-05 DIAGNOSIS — R0789 Other chest pain: Secondary | ICD-10-CM | POA: Diagnosis not present

## 2015-09-05 DIAGNOSIS — R002 Palpitations: Secondary | ICD-10-CM | POA: Diagnosis not present

## 2015-09-05 NOTE — Patient Instructions (Signed)
Your physician wants you to follow-up in: 1 year with Dr. Hilty. You will receive a reminder letter in the mail two months in advance. If you don't receive a letter, please call our office to schedule the follow-up appointment.  

## 2015-09-05 NOTE — Progress Notes (Signed)
OFFICE NOTE  Chief Complaint:  Follow-up studies  Primary Care Physician: Collene Mares, PA-C  HPI:  Jane Wilkerson  is a 75 year old female with a history of PVCs and bigeminy but had a stress test in May which was negative and that seemed to have resolved. She has had some reflux symptoms as well which improved. Unfortunately over the past year I understand she was diagnosed with breast cancer which is DCIS and underwent local excision and is on chemotherapy. She seems to be doing well and is about a year out from that, and fortunately that was caught early on mammogram. From a cardiac standpoint she denies any chest pain, worsening shortness of breath, palpitations, presyncope or syncopal symptoms.  Recently she had been told that she had some abnormalities with her renal function. She was referred to a nephrologist who she saw a couple times and underwent a 24-hour urine in additional blood work. She was subsequently released. She's not had any recent lipid profile testing not aware of. She continues to have some palpitations although they're not quite as bothersome.  She is seeing Dr. Hinda Lenis, who feels that her renal function is stable. He recently increased her by Bystolic to 20 mg daily.  Jane Wilkerson returns today for follow-up. Recently she's been a little more anxious and she's noted some lability in her blood pressures. At times her blood pressure was as low as 80/50 but can run high. She's also felt some fatigue, some aching or tightness in her chest, particularly with sitting up at night and some progressive increase in shortness of breath. Blood pressure today was elevated in the office 162/98. She's recently had some medication timing adjustments by her primary care provider.  I saw Jane Wilkerson back today in the office. Overall she is feeling so slightly better. She does have some labile blood pressures. She underwent nuclear stress testing which was negative for ischemia and  showed normal LV function. Her echo also showed normal LV function. There was borderline enlarged aortic root at 3.7 cm.  PMHx:  Past Medical History  Diagnosis Date  . PONV (postoperative nausea and vomiting)   . Hyperlipidemia     takes Zocor daily  . Shortness of breath     with exertion  . Dizziness     occasionally  . Arthritis     back   . Chronic back pain   . Osteoporosis   . History of shingles 2011  . GERD (gastroesophageal reflux disease)     takes Nexium daily  . Gastric ulcer   . Hemorrhoids   . Hx of colonic polyps   . Diverticulosis   . Urinary frequency   . Nocturia   . Hypothyroidism     takes Synthroid daily  . Cataract     right eye  . Anxiety 07/07/2011    takes Ativan every 4hrs  . Hypertension     takes Bystolic and Losartan daily  . Asthma     uses inhaler prn  . Depression     has appt to discuss depression 07/26/2011  . Nausea alone     onset last Wednesday  08/04/11  . Breast cancer ([place])     right - local excision, chemotheraphy  . PVC's (premature ventricular contractions)   . History of nuclear stress test 11/2010    lexiscan; no evidence of inducible ischemia; normal pattern of perfusion     Past Surgical History  Procedure Laterality Date  . Abdominal hysterectomy  2001  . Hemorrhoid surgery    . Colonscopy    . Breast surgery Left 1972    left lumpectomy  . Breast lumpectomy Right 07/02/11    right breast and SNL  . Breast surgery Right 08/11/11    margins on rt breast  . Transthoracic echocardiogram  2009    normal LV & RV systolic function; mild mitral annular calcif & mild MR; trace TR    FAMHx:  Family History  Problem Relation Age of Onset  . Colon cancer Mother     also stroke  . Pancreatic cancer Father     also heart disease  . Anesthesia problems Neg Hx   . Hypotension Neg Hx   . Malignant hyperthermia Neg Hx   . Pseudochol deficiency Neg Hx   . Diabetes Sister   . Hypertension Sister     SOCHx:   reports  that she has never smoked. She has never used smokeless tobacco. She reports that she does not drink alcohol or use illicit drugs.  ALLERGIES:  Allergies  Allergen Reactions  . Hydrocodone Other (See Comments)    Reaction unknown  . Oxycodone Hcl Itching  . Ziac [Bisoprolol-Hydrochlorothiazide] Other (See Comments)    Reaction unknown  . Penicillins Rash    ROS: A comprehensive review of systems was negative except for: Cardiovascular: positive for chest pressure/discomfort, dyspnea, fatigue and Labile blood pressure  HOME MEDS: Current Outpatient Prescriptions  Medication Sig Dispense Refill  . albuterol (PROVENTIL HFA;VENTOLIN HFA) 108 (90 BASE) MCG/ACT inhaler Inhale 2 puffs into the lungs every 6 (six) hours as needed. For shortness of breath    . alendronate (FOSAMAX) 70 MG tablet Take 1 tablet by mouth once a week.    Marland Kitchen anastrozole (ARIMIDEX) 1 MG tablet TAKE 1 TABLET DAILY 90 tablet 3  . Calcium Carbonate (CALTRATE 600 PO) Take by mouth daily.    . citalopram (CELEXA) 20 MG tablet Take 20 mg by mouth daily.    Marland Kitchen esomeprazole (NEXIUM) 40 MG capsule Take 40 mg by mouth 2 (two) times daily.     . fish oil-omega-3 fatty acids 1000 MG capsule Take 2 g by mouth daily.     . hydrochlorothiazide (MICROZIDE) 12.5 MG capsule Take 12.5 mg by mouth daily.    . hydrocortisone cream 0.5 % Apply 1 application topically 2 (two) times daily as needed. For hemorrhoid pain    . levothyroxine (SYNTHROID, LEVOTHROID) 50 MCG tablet Take 50 mcg by mouth daily.    Marland Kitchen LORazepam (ATIVAN) 1 MG tablet Take 1 mg by mouth every 6 (six) hours as needed. For anxiety    . losartan (COZAAR) 100 MG tablet Take 100 mg by mouth daily.     . nebivolol (BYSTOLIC) 10 MG tablet Take 1 tablet (10 mg total) by mouth daily. 30 tablet 6  . simvastatin (ZOCOR) 40 MG tablet Take 40 mg by mouth at bedtime.       No current facility-administered medications for this visit.    LABS/IMAGING: No results found for this or  any previous visit (from the past 48 hour(s)). No results found.  VITALS: BP 168/102 mmHg  Pulse 78  Ht 5\' 2"  (1.575 m)  Wt 161 lb (73.029 kg)  BMI 29.44 kg/m2  EXAM: Deferred  EKG: Deferred  ASSESSMENT: 1. Chest pressure/dyspnea on exertion - low risk stress test and echocardiogram with normal LV function 2. Top normal ascending aorta 3.7 cm 3. Palpitations-generally well controlled 4. Dyslipidemia-due for a cholesterol check 5.  Recent history of breast cancer 6. Abnormal renal function by report 7. Labile hypertension 8. Anxiety  PLAN: 1.   Jane Wilkerson continues to have some labile blood pressures which is likely related to anxiety. Her stress test and echocardiogram are reassuring. She does have a top normal to borderline dilated ascending aorta 3.7 cm. This should be followed up in approximately one year. I would not make further adjustments on her blood pressure medicines now. We need to avoid low blood pressures if they are truly is labile as she has mentioned.  Pixie Casino, MD, [facility] Attending Cardiologist [place] 09/05/2015, 1:35 PM

## 2015-11-06 ENCOUNTER — Ambulatory Visit: Payer: Medicare Other | Admitting: Hematology and Oncology

## 2015-11-17 ENCOUNTER — Telehealth: Payer: Self-pay | Admitting: Hematology and Oncology

## 2015-11-17 ENCOUNTER — Encounter: Payer: Self-pay | Admitting: Hematology and Oncology

## 2015-11-17 ENCOUNTER — Ambulatory Visit (HOSPITAL_BASED_OUTPATIENT_CLINIC_OR_DEPARTMENT_OTHER): Payer: Medicare Other | Admitting: Hematology and Oncology

## 2015-11-17 VITALS — BP 147/93 | HR 82 | Temp 98.3°F | Resp 18 | Wt 162.8 lb

## 2015-11-17 DIAGNOSIS — Z79811 Long term (current) use of aromatase inhibitors: Secondary | ICD-10-CM

## 2015-11-17 DIAGNOSIS — D0511 Intraductal carcinoma in situ of right breast: Secondary | ICD-10-CM

## 2015-11-17 DIAGNOSIS — Z170 Estrogen receptor positive status [ER+]: Secondary | ICD-10-CM | POA: Diagnosis not present

## 2015-11-17 DIAGNOSIS — C50411 Malignant neoplasm of upper-outer quadrant of right female breast: Secondary | ICD-10-CM

## 2015-11-17 NOTE — Telephone Encounter (Signed)
appt made and avs printed °

## 2015-11-17 NOTE — Progress Notes (Signed)
Patient Care Team: Jane Munch, PA-C as PCP - General (Physician Assistant)  DIAGNOSIS: Malignant neoplasm of upper-outer quadrant of right female breast [facility])   Staging form: Breast, AJCC 7th Edition     Pathologic: Stage 0 (Tis (DCIS), N0, cM0) - Signed by Rulon Eisenmenger, MD on 05/06/2014   SUMMARY OF ONCOLOGIC HISTORY:   Malignant neoplasm of upper-outer quadrant of right female breast [name]   07/02/2011 Surgery Right lumpectomy: DCIS ER 100%, PR 0%   07/23/2011 - 08/20/2011 Radiation Therapy Adjuvant XRT   11/19/2011 -  Anti-estrogen oral therapy Aromasin 25 mg daily X 1 month then changed to Arimidex 1 mg daily    CHIEF COMPLIANT: follow-up on Arimidex therapy  INTERVAL HISTORY: Jane Wilkerson is a 75 year old with above-mentioned right breast DCIS currently on Arimidex therapy and appears to be tolerating Arimidex extremely well. She denies any hot flashes or myalgias.She denies any lumps or nodules in the breasts.  REVIEW OF SYSTEMS:   Constitutional: Denies fevers, chills or abnormal weight loss Eyes: Denies blurriness of vision Ears, nose, mouth, throat, and face: Denies mucositis or sore throat Respiratory: Denies cough, dyspnea or wheezes Cardiovascular: Denies palpitation, chest discomfort Gastrointestinal:  Denies nausea, heartburn or change in bowel habits Skin: Denies abnormal skin rashes Lymphatics: Denies new lymphadenopathy or easy bruising Neurological:Denies numbness, tingling or new weaknesses Behavioral/Psych: Mood is stable, no new changes  Extremities: No lower extremity edema Breast:  denies any pain or lumps or nodules in either breasts All other systems were reviewed with the patient and are negative.  I have reviewed the past medical history, past surgical history, social history and family history with the patient and they are unchanged from previous note.  ALLERGIES:  is allergic to hydrocodone; oxycodone hcl; ziac; and  penicillins.  MEDICATIONS:  Current Outpatient Prescriptions  Medication Sig Dispense Refill  . albuterol (PROVENTIL HFA;VENTOLIN HFA) 108 (90 BASE) MCG/ACT inhaler Inhale 2 puffs into the lungs every 6 (six) hours as needed. For shortness of breath    . alendronate (FOSAMAX) 70 MG tablet Take 1 tablet by mouth once a week.    Marland Kitchen anastrozole (ARIMIDEX) 1 MG tablet TAKE 1 TABLET DAILY 90 tablet 3  . Calcium Carbonate (CALTRATE 600 PO) Take by mouth daily.    . citalopram (CELEXA) 20 MG tablet Take 20 mg by mouth daily.    Marland Kitchen esomeprazole (NEXIUM) 40 MG capsule Take 40 mg by mouth 2 (two) times daily.     . fish oil-omega-3 fatty acids 1000 MG capsule Take 2 g by mouth daily.     . hydrochlorothiazide (MICROZIDE) 12.5 MG capsule Take 12.5 mg by mouth daily.    . hydrocortisone cream 0.5 % Apply 1 application topically 2 (two) times daily as needed. For hemorrhoid pain    . levothyroxine (SYNTHROID, LEVOTHROID) 50 MCG tablet Take 50 mcg by mouth daily.    Marland Kitchen LORazepam (ATIVAN) 1 MG tablet Take 1 mg by mouth every 6 (six) hours as needed. For anxiety    . losartan (COZAAR) 100 MG tablet Take 100 mg by mouth daily.     . nebivolol (BYSTOLIC) 10 MG tablet Take 1 tablet (10 mg total) by mouth daily. 30 tablet 6  . simvastatin (ZOCOR) 40 MG tablet Take 40 mg by mouth at bedtime.       No current facility-administered medications for this visit.    PHYSICAL EXAMINATION: ECOG PERFORMANCE STATUS: 0 - Asymptomatic  Filed Vitals:   11/17/15 1455  BP:  147/93  Pulse: 82  Temp: 98.3 F (36.8 C)  Resp: 18   Filed Weights   11/17/15 1455  Weight: 162 lb 12.8 oz (73.846 kg)    GENERAL:alert, no distress and comfortable SKIN: skin color, texture, turgor are normal, no rashes or significant lesions EYES: normal, Conjunctiva are pink and non-injected, sclera clear OROPHARYNX:no exudate, no erythema and lips, buccal mucosa, and tongue normal  NECK: supple, thyroid normal size, non-tender, without  nodularity LYMPH:  no palpable lymphadenopathy in the cervical, axillary or inguinal LUNGS: clear to auscultation and percussion with normal breathing effort HEART: regular rate & rhythm and no murmurs and no lower extremity edema ABDOMEN:abdomen soft, non-tender and normal bowel sounds MUSCULOSKELETAL:no cyanosis of digits and no clubbing  NEURO: alert & oriented x 3 with fluent speech, no focal motor/sensory deficits EXTREMITIES: No lower extremity edema BREAST: No palpable masses or nodules in either right or left breasts. No palpable axillary supraclavicular or infraclavicular adenopathy no breast tenderness or nipple discharge. (exam performed in the presence of a chaperone)  LABORATORY DATA:  I have reviewed the data as listed   Chemistry      Component Value Date/Time   NA 139 11/07/2014 1021   NA 138 05/11/2013 1021   K 4.3 11/07/2014 1021   K 4.2 05/11/2013 1021   CL 101 05/11/2013 1021   CL 100 08/04/2012 0921   CO2 26 11/07/2014 1021   CO2 32 05/11/2013 1021   BUN 12.9 11/07/2014 1021   BUN 16 05/11/2013 1021   CREATININE 1.2* 11/07/2014 1021   CREATININE 1.32* 05/11/2013 1021   CREATININE 1.20* 11/19/2011 0821      Component Value Date/Time   CALCIUM 9.1 11/07/2014 1021   CALCIUM 9.4 05/11/2013 1021   ALKPHOS 67 11/07/2014 1021   ALKPHOS 59 05/11/2013 1021   AST 13 11/07/2014 1021   AST 12 05/11/2013 1021   ALT 11 11/07/2014 1021   ALT 10 05/11/2013 1021   BILITOT 0.41 11/07/2014 1021   BILITOT 0.4 05/11/2013 1021       Lab Results  Component Value Date   WBC 7.7 11/07/2014   HGB 11.8 11/07/2014   HCT 35.8 11/07/2014   MCV 88.0 11/07/2014   PLT 286 11/07/2014   NEUTROABS 5.5 11/07/2014     ASSESSMENT & PLAN:  Malignant neoplasm of upper-outer quadrant of right female breast High-grade ductal carcinoma in situ of the right breast status post lumpectomy with sentinel node biopsy. The final pathology revealed a 1.5 cm DCIS that is ER positive PR  negative on Arimidex Since May 2013  Aromatase inhibitor counseling: Tolerating anastrozole very well Complaints of fatigue attributable to inactivity. Patient will complete 5 years of therapy by March 2018 and will stop treatment at that time.I discussed with her that there is no role of extended adjuvant therapy for DCIS.  Breast Cancer Surveillance: 1. Breast exam 11/17/2015: Normal 2. Mammogram 06/03/15 No abnormalities. Postsurgical changes. Breast Density Category B. I recommended that she get 3-D mammograms for surveillance. Discussed the differences between different breast density categories.  Survivorship: I continuously stressed the importance of doing physical exercise.  Return to clinic in 1 year for followup with survivorship clinic.   No orders of the defined types were placed in this encounter.   The patient has a good understanding of the overall plan. she agrees with it. she will call with any problems that may develop before the next visit here.   Rulon Eisenmenger, MD 11/17/2015

## 2015-11-17 NOTE — Assessment & Plan Note (Signed)
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 The final pathology revealed a 1.5 cm DCIS that is ER positive PR negative on Arimidex Since May 2013  Aromatase inhibitor counseling: Tolerating anastrozole very well Complaints of fatigue attributable to inactivity. Patient will complete 5 years of therapy by March 2018 and will stop treatment at that time.I discussed with her that there is no role of extended adjuvant therapy for DCIS.  Breast Cancer Surveillance: 1. Breast exam 11/17/2015: Normal 2. Mammogram 06/03/15 No abnormalities. Postsurgical changes. Breast Density Category B. I recommended that she get 3-D mammograms for surveillance. Discussed the differences between different breast density categories.  Survivorship: I provided her with the [employer] program to get her doctor in physical exercise.  Return to clinic in 1 year for followup with survivorship clinic.

## 2015-11-18 DIAGNOSIS — I10 Essential (primary) hypertension: Secondary | ICD-10-CM | POA: Diagnosis not present

## 2015-11-18 DIAGNOSIS — R7309 Other abnormal glucose: Secondary | ICD-10-CM | POA: Diagnosis not present

## 2015-11-18 DIAGNOSIS — Z6830 Body mass index (BMI) 30.0-30.9, adult: Secondary | ICD-10-CM | POA: Diagnosis not present

## 2015-11-18 DIAGNOSIS — E782 Mixed hyperlipidemia: Secondary | ICD-10-CM | POA: Diagnosis not present

## 2015-11-18 DIAGNOSIS — Z1389 Encounter for screening for other disorder: Secondary | ICD-10-CM | POA: Diagnosis not present

## 2016-03-11 DIAGNOSIS — Z6829 Body mass index (BMI) 29.0-29.9, adult: Secondary | ICD-10-CM | POA: Diagnosis not present

## 2016-03-11 DIAGNOSIS — E063 Autoimmune thyroiditis: Secondary | ICD-10-CM | POA: Diagnosis not present

## 2016-03-11 DIAGNOSIS — Z1389 Encounter for screening for other disorder: Secondary | ICD-10-CM | POA: Diagnosis not present

## 2016-03-11 DIAGNOSIS — E663 Overweight: Secondary | ICD-10-CM | POA: Diagnosis not present

## 2016-03-11 DIAGNOSIS — Z23 Encounter for immunization: Secondary | ICD-10-CM | POA: Diagnosis not present

## 2016-03-11 DIAGNOSIS — Z0000 Encounter for general adult medical examination without abnormal findings: Secondary | ICD-10-CM | POA: Diagnosis not present

## 2016-03-11 DIAGNOSIS — N189 Chronic kidney disease, unspecified: Secondary | ICD-10-CM | POA: Diagnosis not present

## 2016-03-26 DIAGNOSIS — E871 Hypo-osmolality and hyponatremia: Secondary | ICD-10-CM | POA: Diagnosis not present

## 2016-05-04 ENCOUNTER — Other Ambulatory Visit (HOSPITAL_COMMUNITY): Payer: Self-pay | Admitting: Internal Medicine

## 2016-05-05 ENCOUNTER — Other Ambulatory Visit (HOSPITAL_COMMUNITY): Payer: Self-pay | Admitting: Internal Medicine

## 2016-05-05 DIAGNOSIS — Z9889 Other specified postprocedural states: Secondary | ICD-10-CM

## 2016-05-18 ENCOUNTER — Encounter (HOSPITAL_COMMUNITY): Payer: Medicare Other

## 2016-05-20 ENCOUNTER — Other Ambulatory Visit: Payer: Self-pay | Admitting: Hematology and Oncology

## 2016-05-20 DIAGNOSIS — C50411 Malignant neoplasm of upper-outer quadrant of right female breast: Secondary | ICD-10-CM

## 2016-06-08 ENCOUNTER — Ambulatory Visit (HOSPITAL_COMMUNITY)
Admission: RE | Admit: 2016-06-08 | Discharge: 2016-06-08 | Disposition: A | Discharge status: Home / Self Care | Payer: Medicare Other | Source: Ambulatory Visit | Attending: Internal Medicine | Admitting: Internal Medicine

## 2016-06-08 DIAGNOSIS — Z853 Personal history of malignant neoplasm of breast: Secondary | ICD-10-CM | POA: Insufficient documentation

## 2016-06-08 DIAGNOSIS — Z9889 Other specified postprocedural states: Secondary | ICD-10-CM | POA: Insufficient documentation

## 2016-06-08 DIAGNOSIS — R928 Other abnormal and inconclusive findings on diagnostic imaging of breast: Secondary | ICD-10-CM | POA: Diagnosis not present

## 2016-06-14 DIAGNOSIS — E063 Autoimmune thyroiditis: Secondary | ICD-10-CM | POA: Diagnosis not present

## 2016-06-14 DIAGNOSIS — E663 Overweight: Secondary | ICD-10-CM | POA: Diagnosis not present

## 2016-06-14 DIAGNOSIS — Z1389 Encounter for screening for other disorder: Secondary | ICD-10-CM | POA: Diagnosis not present

## 2016-06-14 DIAGNOSIS — M47814 Spondylosis without myelopathy or radiculopathy, thoracic region: Secondary | ICD-10-CM | POA: Diagnosis not present

## 2016-06-14 DIAGNOSIS — Z6829 Body mass index (BMI) 29.0-29.9, adult: Secondary | ICD-10-CM | POA: Diagnosis not present

## 2016-06-14 DIAGNOSIS — M546 Pain in thoracic spine: Secondary | ICD-10-CM | POA: Diagnosis not present

## 2016-08-24 DIAGNOSIS — H2513 Age-related nuclear cataract, bilateral: Secondary | ICD-10-CM | POA: Diagnosis not present

## 2016-08-24 DIAGNOSIS — H524 Presbyopia: Secondary | ICD-10-CM | POA: Diagnosis not present

## 2016-08-24 DIAGNOSIS — H52203 Unspecified astigmatism, bilateral: Secondary | ICD-10-CM | POA: Diagnosis not present

## 2016-08-27 NOTE — Patient Instructions (Signed)
Your procedure is scheduled on: 08/31/2016  Report to [facility] at   640  AM.  Call this number if you have problems the morning of surgery: [phone]   Do not eat food or drink liquids :After Midnight.      Take these medicines the morning of surgery with A SIP OF WATER: armidex, celexa, nexium, levothyroxine, cozaar, bystolic.   Do not wear jewelry, make-up or nail polish.  Do not wear lotions, powders, or perfumes. You may wear deodorant.  Do not shave 48 hours prior to surgery.  Do not bring valuables to the hospital.  Contacts, dentures or bridgework may not be worn into surgery.  Leave suitcase in the car. After surgery it may be brought to your room.  For patients admitted to the hospital, checkout time is 11:00 AM the day of discharge.   Patients discharged the day of surgery will not be allowed to drive home.  :     Please read over the following fact sheets that you were given: Coughing and Deep Breathing, Surgical Site Infection Prevention, Anesthesia Post-op Instructions and Care and Recovery After Surgery    Cataract A cataract is a clouding of the lens of the eye. When a lens becomes cloudy, vision is reduced based on the degree and nature of the clouding. Many cataracts reduce vision to some degree. Some cataracts make people more near-sighted as they develop. Other cataracts increase glare. Cataracts that are ignored and become worse can sometimes look white. The white color can be seen through the pupil. CAUSES   Aging. However, cataracts may occur at any age, even in newborns.   Certain drugs.   Trauma to the eye.   Certain diseases such as diabetes.   Specific eye diseases such as chronic inflammation inside the eye or a sudden attack of a rare form of glaucoma.   Inherited or acquired medical problems.  SYMPTOMS   Gradual, progressive drop in vision in the affected eye.   Severe, rapid visual loss. This most often happens when trauma is the cause.    DIAGNOSIS  To detect a cataract, an eye doctor examines the lens. Cataracts are best diagnosed with an exam of the eyes with the pupils enlarged (dilated) by drops.  TREATMENT  For an early cataract, vision may improve by using different eyeglasses or stronger lighting. If that does not help your vision, surgery is the only effective treatment. A cataract needs to be surgically removed when vision loss interferes with your everyday activities, such as driving, reading, or watching TV. A cataract may also have to be removed if it prevents examination or treatment of another eye problem. Surgery removes the cloudy lens and usually replaces it with a substitute lens (intraocular lens, IOL).  At a time when both you and your doctor agree, the cataract will be surgically removed. If you have cataracts in both eyes, only one is usually removed at a time. This allows the operated eye to heal and be out of danger from any possible problems after surgery (such as infection or poor wound healing). In rare cases, a cataract may be doing damage to your eye. In these cases, your caregiver may advise surgical removal right away. The vast majority of people who have cataract surgery have better vision afterward. HOME CARE INSTRUCTIONS  If you are not planning surgery, you may be asked to do the following:  Use different eyeglasses.   Use stronger or brighter lighting.   Ask your eye  doctor about reducing your medicine dose or changing medicines if it is thought that a medicine caused your cataract. Changing medicines does not make the cataract go away on its own.   Become familiar with your surroundings. Poor vision can lead to injury. Avoid bumping into things on the affected side. You are at a higher risk for tripping or falling.   Exercise extreme care when driving or operating machinery.   Wear sunglasses if you are sensitive to bright light or experiencing problems with glare.  SEEK IMMEDIATE MEDICAL CARE  IF:   You have a worsening or sudden vision loss.   You notice redness, swelling, or increasing pain in the eye.   You have a fever.  Document Released: 06/21/2005 Document Revised: 06/10/2011 Document Reviewed: 02/12/2011 [facility] Patient Information 2012 [place].PATIENT INSTRUCTIONS POST-ANESTHESIA  IMMEDIATELY FOLLOWING SURGERY:  Do not drive or operate machinery for the first twenty four hours after surgery.  Do not make any important decisions for twenty four hours after surgery or while taking narcotic pain medications or sedatives.  If you develop intractable nausea and vomiting or a severe headache please notify your doctor immediately.  FOLLOW-UP:  Please make an appointment with your surgeon as instructed. You do not need to follow up with anesthesia unless specifically instructed to do so.  WOUND CARE INSTRUCTIONS (if applicable):  Keep a dry clean dressing on the anesthesia/puncture wound site if there is drainage.  Once the wound has quit draining you may leave it open to air.  Generally you should leave the bandage intact for twenty four hours unless there is drainage.  If the epidural site drains for more than 36-48 hours please call the anesthesia department.  QUESTIONS?:  Please feel free to call your physician or the hospital operator if you have any questions, and they will be happy to assist you.

## 2016-08-30 ENCOUNTER — Encounter (HOSPITAL_COMMUNITY)
Admission: RE | Admit: 2016-08-30 | Discharge: 2016-08-30 | Disposition: A | Discharge status: Home / Self Care | Payer: Medicare Other | Source: Ambulatory Visit | Attending: Ophthalmology | Admitting: Ophthalmology

## 2016-08-30 ENCOUNTER — Encounter (HOSPITAL_COMMUNITY): Payer: Self-pay

## 2016-08-30 DIAGNOSIS — R0609 Other forms of dyspnea: Secondary | ICD-10-CM | POA: Diagnosis not present

## 2016-08-30 DIAGNOSIS — Z01812 Encounter for preprocedural laboratory examination: Secondary | ICD-10-CM

## 2016-08-30 DIAGNOSIS — R9431 Abnormal electrocardiogram [ECG] [EKG]: Secondary | ICD-10-CM

## 2016-08-30 DIAGNOSIS — Z01818 Encounter for other preprocedural examination: Secondary | ICD-10-CM | POA: Insufficient documentation

## 2016-08-30 DIAGNOSIS — F419 Anxiety disorder, unspecified: Secondary | ICD-10-CM | POA: Diagnosis not present

## 2016-08-30 DIAGNOSIS — E039 Hypothyroidism, unspecified: Secondary | ICD-10-CM | POA: Diagnosis not present

## 2016-08-30 DIAGNOSIS — F329 Major depressive disorder, single episode, unspecified: Secondary | ICD-10-CM | POA: Diagnosis not present

## 2016-08-30 DIAGNOSIS — Z79899 Other long term (current) drug therapy: Secondary | ICD-10-CM | POA: Diagnosis not present

## 2016-08-30 DIAGNOSIS — H269 Unspecified cataract: Secondary | ICD-10-CM

## 2016-08-30 DIAGNOSIS — K219 Gastro-esophageal reflux disease without esophagitis: Secondary | ICD-10-CM | POA: Diagnosis not present

## 2016-08-30 DIAGNOSIS — I10 Essential (primary) hypertension: Secondary | ICD-10-CM | POA: Diagnosis not present

## 2016-08-30 DIAGNOSIS — H2511 Age-related nuclear cataract, right eye: Secondary | ICD-10-CM | POA: Diagnosis not present

## 2016-08-30 DIAGNOSIS — J45909 Unspecified asthma, uncomplicated: Secondary | ICD-10-CM | POA: Diagnosis not present

## 2016-08-30 LAB — BASIC METABOLIC PANEL
Anion gap: 4 — ABNORMAL LOW (ref 5–15)
BUN: 16 mg/dL (ref 6–20)
CO2: 31 mmol/L (ref 22–32)
CREATININE: 1.21 mg/dL — AB (ref 0.44–1.00)
Calcium: 9 mg/dL (ref 8.9–10.3)
Chloride: 102 mmol/L (ref 101–111)
GFR, EST AFRICAN AMERICAN: 49 mL/min — AB (ref >60)
GFR, EST NON AFRICAN AMERICAN: 43 mL/min — AB (ref >60)
Glucose, Bld: 116 mg/dL — ABNORMAL HIGH (ref 65–99)
POTASSIUM: 4.1 mmol/L (ref 3.5–5.1)
Sodium: 137 mmol/L (ref 135–145)

## 2016-08-30 LAB — CBC WITH DIFFERENTIAL/PLATELET
BASOS PCT: 1 %
Basophils Absolute: 0.1 10*3/uL (ref 0.0–0.1)
EOS ABS: 0.3 10*3/uL (ref 0.0–0.7)
EOS PCT: 3 %
HCT: 36 % (ref 36.0–46.0)
Hemoglobin: 11.5 g/dL — ABNORMAL LOW (ref 12.0–15.0)
Lymphocytes Relative: 14 %
Lymphs Abs: 1.2 10*3/uL (ref 0.7–4.0)
MCH: 28.9 pg (ref 26.0–34.0)
MCHC: 31.9 g/dL (ref 30.0–36.0)
MCV: 90.5 fL (ref 78.0–100.0)
Monocytes Absolute: 1 10*3/uL (ref 0.1–1.0)
Monocytes Relative: 12 %
Neutro Abs: 5.9 10*3/uL (ref 1.7–7.7)
Neutrophils Relative %: 70 %
PLATELETS: 293 10*3/uL (ref 150–400)
RBC: 3.98 MIL/uL (ref 3.87–5.11)
RDW: 14.3 % (ref 11.5–15.5)
WBC: 8.3 10*3/uL (ref 4.0–10.5)

## 2016-08-31 ENCOUNTER — Encounter (HOSPITAL_COMMUNITY): Payer: Self-pay | Admitting: Anesthesiology

## 2016-08-31 ENCOUNTER — Ambulatory Visit (HOSPITAL_COMMUNITY)
Admission: RE | Admit: 2016-08-31 | Discharge: 2016-08-31 | Disposition: A | Discharge status: Home / Self Care | Payer: Medicare Other | Source: Ambulatory Visit | Attending: Ophthalmology | Admitting: Ophthalmology

## 2016-08-31 ENCOUNTER — Ambulatory Visit (HOSPITAL_COMMUNITY): Payer: Medicare Other | Admitting: Anesthesiology

## 2016-08-31 ENCOUNTER — Encounter (HOSPITAL_COMMUNITY)
Admission: RE | Disposition: A | Discharge status: Home / Self Care | Payer: Self-pay | Source: Ambulatory Visit | Attending: Ophthalmology

## 2016-08-31 DIAGNOSIS — E039 Hypothyroidism, unspecified: Secondary | ICD-10-CM | POA: Insufficient documentation

## 2016-08-31 DIAGNOSIS — R0609 Other forms of dyspnea: Secondary | ICD-10-CM | POA: Diagnosis not present

## 2016-08-31 DIAGNOSIS — J45909 Unspecified asthma, uncomplicated: Secondary | ICD-10-CM | POA: Insufficient documentation

## 2016-08-31 DIAGNOSIS — H2511 Age-related nuclear cataract, right eye: Secondary | ICD-10-CM | POA: Insufficient documentation

## 2016-08-31 DIAGNOSIS — K219 Gastro-esophageal reflux disease without esophagitis: Secondary | ICD-10-CM | POA: Insufficient documentation

## 2016-08-31 DIAGNOSIS — Z79899 Other long term (current) drug therapy: Secondary | ICD-10-CM | POA: Diagnosis not present

## 2016-08-31 DIAGNOSIS — F419 Anxiety disorder, unspecified: Secondary | ICD-10-CM | POA: Insufficient documentation

## 2016-08-31 DIAGNOSIS — H2512 Age-related nuclear cataract, left eye: Secondary | ICD-10-CM | POA: Diagnosis not present

## 2016-08-31 DIAGNOSIS — I10 Essential (primary) hypertension: Secondary | ICD-10-CM | POA: Insufficient documentation

## 2016-08-31 DIAGNOSIS — F329 Major depressive disorder, single episode, unspecified: Secondary | ICD-10-CM | POA: Diagnosis not present

## 2016-08-31 HISTORY — PX: CATARACT EXTRACTION W/PHACO: SHX586

## 2016-08-31 SURGERY — PHACOEMULSIFICATION, CATARACT, WITH IOL INSERTION
Anesthesia: Monitor Anesthesia Care | Site: Eye | Laterality: Right

## 2016-08-31 MED ORDER — LIDOCAINE HCL 3.5 % OP GEL: 1.0000 "application " | Freq: Once | OPHTHALMIC | Status: DC

## 2016-08-31 MED ORDER — EPINEPHRINE PF 1 MG/ML IJ SOLN
INTRAOCULAR | Status: DC | PRN
  Administered 2016-08-31: 500 mL

## 2016-08-31 MED ORDER — BSS IO SOLN
INTRAOCULAR | Status: DC | PRN
  Administered 2016-08-31: 15 mL

## 2016-08-31 MED ORDER — TETRACAINE 0.5 % OP SOLN OPTIME - NO CHARGE
OPHTHALMIC | Status: DC | PRN
  Administered 2016-08-31: 1 [drp] via OPHTHALMIC

## 2016-08-31 MED ORDER — ONDANSETRON HCL 4 MG/2ML IJ SOLN
4.0000 mg | Freq: Once | INTRAMUSCULAR | Status: AC
  Administered 2016-08-31: 4 mg via INTRAVENOUS

## 2016-08-31 MED ORDER — PROVISC 10 MG/ML IO SOLN
INTRAOCULAR | Status: DC | PRN
  Administered 2016-08-31: 0.85 mL via INTRAOCULAR

## 2016-08-31 MED ORDER — FENTANYL CITRATE (PF) 100 MCG/2ML IJ SOLN
INTRAMUSCULAR | Status: AC
Start: 2016-08-31 — End: ?
  Filled 2016-08-31: qty 2

## 2016-08-31 MED ORDER — PHENYLEPHRINE HCL 2.5 % OP SOLN
1.0000 [drp] | OPHTHALMIC | Status: AC
  Administered 2016-08-31 (×3): 1 [drp] via OPHTHALMIC

## 2016-08-31 MED ORDER — MIDAZOLAM HCL 2 MG/2ML IJ SOLN
INTRAMUSCULAR | Status: AC
  Filled 2016-08-31: qty 2

## 2016-08-31 MED ORDER — FENTANYL CITRATE (PF) 100 MCG/2ML IJ SOLN: 25.0000 ug | Freq: Once | INTRAMUSCULAR | Status: DC

## 2016-08-31 MED ORDER — TETRACAINE HCL 0.5 % OP SOLN
1.0000 [drp] | OPHTHALMIC | Status: AC
  Administered 2016-08-31 (×3): 1 [drp] via OPHTHALMIC

## 2016-08-31 MED ORDER — ONDANSETRON HCL 4 MG/2ML IJ SOLN
INTRAMUSCULAR | Status: AC
  Filled 2016-08-31: qty 2

## 2016-08-31 MED ORDER — LACTATED RINGERS IV SOLN
INTRAVENOUS | Status: DC
  Administered 2016-08-31: 07:00:00 via INTRAVENOUS

## 2016-08-31 MED ORDER — CYCLOPENTOLATE-PHENYLEPHRINE 0.2-1 % OP SOLN
1.0000 [drp] | OPHTHALMIC | Status: AC
  Administered 2016-08-31 (×3): 1 [drp] via OPHTHALMIC

## 2016-08-31 MED ORDER — MIDAZOLAM HCL 2 MG/2ML IJ SOLN: 1.0000 mg | INTRAMUSCULAR | Status: AC

## 2016-08-31 SURGICAL SUPPLY — 10 items

## 2016-08-31 NOTE — H&P (Signed)
The patient was re examined and there is no change in the patients condition since the original H and P. 

## 2016-08-31 NOTE — Discharge Instructions (Signed)
PATIENT INSTRUCTIONS °POST-ANESTHESIA ° °IMMEDIATELY FOLLOWING SURGERY:  Do not drive or operate machinery for the first twenty four hours after surgery.  Do not make any important decisions for twenty four hours after surgery or while taking narcotic pain medications or sedatives.  If you develop intractable nausea and vomiting or a severe headache please notify your doctor immediately. ° °FOLLOW-UP:  Please make an appointment with your surgeon as instructed. You do not need to follow up with anesthesia unless specifically instructed to do so. ° °WOUND CARE INSTRUCTIONS (if applicable):  Keep a dry clean dressing on the anesthesia/puncture wound site if there is drainage.  Once the wound has quit draining you may leave it open to air.  Generally you should leave the bandage intact for twenty four hours unless there is drainage.  If the epidural site drains for more than 36-48 hours please call the anesthesia department. ° °QUESTIONS?:  Please feel free to call your physician or the hospital operator if you have any questions, and they will be happy to assist you.    ° ° °  °  °        [facility] Instructions °[address]- [place] [address]-[place] °    ° °1. Avoid closing eyes tightly. One often closes the eye tightly when laughing, talking, sneezing, coughing or if they feel irritated. At these times, you should be careful not to close your eyes tightly. ° °2. Instill eye drops as instructed. To instill drops in your eye, open it, look up and have someone gently pull the lower lid down and instill a couple of drops inside the lower lid. ° °3. Do not touch upper lid. ° °4. Take Advil or Tylenol for pain. ° °5. You may use either eye for near work, such as reading or sewing and you may watch television. ° °6. You may have your hair done at the beauty parlor at any time. ° °7. Wear dark glasses with or without your own glasses if you are in bright light. ° °8. Call our office at  [phone] or [phone] if you have sharp pain in your eye or unusual symptoms. ° °9.  FOLLOW UP WITH DR. SHAPIRO TODAY IN HIS [place] OFFICE AT 2:45pm. ° °  °I have received a copy of the above instructions and will follow them.  ° ° ° °IF YOU ARE IN IMMEDIATE DANGER CALL 911! ° °It is important for you to keep your follow-up appointment with your physician after discharge, OR, for you /your caregiver to make a follow-up appointment with your physician / medical provider after discharge. ° °Show these instructions to the next healthcare provider you see. ° ° ° °

## 2016-08-31 NOTE — Transfer of Care (Signed)
Immediate Anesthesia Transfer of Care Note  Patient: Jane Wilkerson  Procedure(s) Performed: Procedure(s) with comments: CATARACT EXTRACTION PHACO AND INTRAOCULAR LENS PLACEMENT (IOC) (Right) - CDE: 8.46  Patient Location: PACU  Anesthesia Type:MAC  Level of Consciousness: awake  Airway & Oxygen Therapy: Patient Spontanous Breathing  Post-op Assessment: Report given to RN  Post vital signs: Reviewed  Last Vitals:  Vitals:   08/31/16 0740 08/31/16 0745  BP: 134/79 138/83  Pulse:    Resp: (!) 21 17  Temp:      Last Pain:  Vitals:   08/31/16 0709  TempSrc: Oral      Patients Stated Pain Goal: 7 (73/53/29 9242)  Complications: No apparent anesthesia complications

## 2016-08-31 NOTE — Op Note (Signed)
Patient brought to the operating room and prepped and draped in the usual manner.  Lid speculum inserted in right eye.  Stab incision made at the twelve o'clock position.  Provisc instilled in the anterior chamber.   A 2.4 mm. Stab incision was made temporally.  An anterior capsulotomy was done with a bent 25 gauge needle.  The nucleus was hydrodissected.  The Phaco tip was inserted in the anterior chamber and the nucleus was emulsified.  CDE was 8.46.  The cortical material was then removed with the I and A tip.  Posterior capsule was the polished.  The anterior chamber was deepened with Provisc.  A 18.5 Diopter Alcon AU00T0 IOL was then inserted in the capsular bag.  Provisc was then removed with the I and A tip.  The wound was then hydrated.  Patient sent to the Recovery Room in good condition with follow up in my office.  Preoperative Diagnosis:  Nuclear Cataract OD Postoperative Diagnosis:  Same Procedure name: Kelman Phacoemulsification OD with IOL

## 2016-08-31 NOTE — Anesthesia Preprocedure Evaluation (Signed)
Anesthesia Evaluation  Patient identified by MRN, date of birth, ID band Patient awake    Reviewed: Allergy & Precautions, H&P , NPO status , Patient's Chart, lab work & pertinent test results, reviewed documented beta blocker date and time   History of Anesthesia Complications (+) PONV and history of anesthetic complications  Airway Mallampati: II  TM Distance: >3 FB Neck ROM: Full    Dental  (+) Teeth Intact, Dental Advisory Given   Pulmonary shortness of breath and with exertion, asthma ,    breath sounds clear to auscultation       Cardiovascular hypertension, Pt. on medications + DOE   Rhythm:Regular Rate:Normal     Neuro/Psych PSYCHIATRIC DISORDERS Anxiety Depression    GI/Hepatic PUD, GERD  Medicated and Controlled,  Endo/Other  Hypothyroidism   Renal/GU      Musculoskeletal   Abdominal   Peds  Hematology   Anesthesia Other Findings   Reproductive/Obstetrics                             Anesthesia Physical Anesthesia Plan  ASA: III  Anesthesia Plan: MAC   Post-op Pain Management:    Induction: Intravenous  Airway Management Planned: Nasal Cannula  Additional Equipment:   Intra-op Plan:   Post-operative Plan:   Informed Consent: I have reviewed the patients History and Physical, chart, labs and discussed the procedure including the risks, benefits and alternatives for the proposed anesthesia with the patient or authorized representative who has indicated his/her understanding and acceptance.     Plan Discussed with:   Anesthesia Plan Comments:         Anesthesia Quick Evaluation

## 2016-08-31 NOTE — Anesthesia Postprocedure Evaluation (Signed)
Anesthesia Post Note  Patient: Michaline Kindig [place]  Procedure(s) Performed: Procedure(s) (LRB): CATARACT EXTRACTION PHACO AND INTRAOCULAR LENS PLACEMENT (IOC) (Right)  Patient location during evaluation: Short Stay Anesthesia Type: MAC Level of consciousness: awake and alert and oriented Pain management: pain level controlled Vital Signs Assessment: post-procedure vital signs reviewed and stable Respiratory status: spontaneous breathing Cardiovascular status: stable Postop Assessment: no signs of nausea or vomiting Anesthetic complications: no     Last Vitals:  Vitals:   08/31/16 0740 08/31/16 0745  BP: 134/79 138/83  Pulse:    Resp: (!) 21 17  Temp:      Last Pain:  Vitals:   08/31/16 0709  TempSrc: Oral                 Annisa Mazzarella

## 2016-09-01 ENCOUNTER — Encounter (HOSPITAL_COMMUNITY): Payer: Self-pay | Admitting: Ophthalmology

## 2016-09-03 ENCOUNTER — Ambulatory Visit: Payer: Medicare Other | Admitting: Internal Medicine

## 2016-09-08 ENCOUNTER — Encounter (HOSPITAL_COMMUNITY): Payer: Self-pay

## 2016-09-08 ENCOUNTER — Encounter (HOSPITAL_COMMUNITY)
Admission: RE | Admit: 2016-09-08 | Discharge: 2016-09-08 | Disposition: A | Discharge status: Home / Self Care | Payer: Medicare Other | Source: Ambulatory Visit | Attending: Ophthalmology | Admitting: Ophthalmology

## 2016-09-14 ENCOUNTER — Ambulatory Visit (HOSPITAL_COMMUNITY): Payer: Medicare Other | Admitting: Anesthesiology

## 2016-09-14 ENCOUNTER — Ambulatory Visit (HOSPITAL_COMMUNITY)
Admission: RE | Admit: 2016-09-14 | Discharge: 2016-09-14 | Disposition: A | Discharge status: Home / Self Care | Payer: Medicare Other | Source: Ambulatory Visit | Attending: Ophthalmology | Admitting: Ophthalmology

## 2016-09-14 ENCOUNTER — Encounter (HOSPITAL_COMMUNITY): Payer: Self-pay | Admitting: *Deleted

## 2016-09-14 ENCOUNTER — Encounter (HOSPITAL_COMMUNITY)
Admission: RE | Disposition: A | Discharge status: Home / Self Care | Payer: Self-pay | Source: Ambulatory Visit | Attending: Ophthalmology

## 2016-09-14 DIAGNOSIS — K219 Gastro-esophageal reflux disease without esophagitis: Secondary | ICD-10-CM | POA: Diagnosis not present

## 2016-09-14 DIAGNOSIS — I10 Essential (primary) hypertension: Secondary | ICD-10-CM | POA: Insufficient documentation

## 2016-09-14 DIAGNOSIS — Z79899 Other long term (current) drug therapy: Secondary | ICD-10-CM | POA: Insufficient documentation

## 2016-09-14 DIAGNOSIS — E039 Hypothyroidism, unspecified: Secondary | ICD-10-CM | POA: Insufficient documentation

## 2016-09-14 DIAGNOSIS — H269 Unspecified cataract: Secondary | ICD-10-CM | POA: Diagnosis not present

## 2016-09-14 DIAGNOSIS — F329 Major depressive disorder, single episode, unspecified: Secondary | ICD-10-CM | POA: Insufficient documentation

## 2016-09-14 DIAGNOSIS — H2512 Age-related nuclear cataract, left eye: Secondary | ICD-10-CM | POA: Insufficient documentation

## 2016-09-14 DIAGNOSIS — F419 Anxiety disorder, unspecified: Secondary | ICD-10-CM | POA: Insufficient documentation

## 2016-09-14 HISTORY — PX: CATARACT EXTRACTION W/PHACO: SHX586

## 2016-09-14 SURGERY — PHACOEMULSIFICATION, CATARACT, WITH IOL INSERTION
Anesthesia: Monitor Anesthesia Care | Site: Eye | Laterality: Left

## 2016-09-14 MED ORDER — BSS IO SOLN
INTRAOCULAR | Status: DC | PRN
  Administered 2016-09-14: 500 mL

## 2016-09-14 MED ORDER — PROVISC 10 MG/ML IO SOLN
INTRAOCULAR | Status: DC | PRN
  Administered 2016-09-14: 0.85 mL via INTRAOCULAR

## 2016-09-14 MED ORDER — LACTATED RINGERS IV SOLN
INTRAVENOUS | Status: DC
  Administered 2016-09-14: 1000 mL via INTRAVENOUS

## 2016-09-14 MED ORDER — PHENYLEPHRINE HCL 2.5 % OP SOLN
1.0000 [drp] | OPHTHALMIC | Status: AC
  Administered 2016-09-14 (×3): 1 [drp] via OPHTHALMIC

## 2016-09-14 MED ORDER — TETRACAINE 0.5 % OP SOLN OPTIME - NO CHARGE
OPHTHALMIC | Status: DC | PRN
  Administered 2016-09-14: 2 [drp] via OPHTHALMIC

## 2016-09-14 MED ORDER — KETOROLAC TROMETHAMINE 0.5 % OP SOLN
1.0000 [drp] | OPHTHALMIC | Status: AC
  Administered 2016-09-14 (×3): 1 [drp] via OPHTHALMIC

## 2016-09-14 MED ORDER — EPINEPHRINE PF 1 MG/ML IJ SOLN
INTRAMUSCULAR | Status: AC
  Filled 2016-09-14: qty 1

## 2016-09-14 MED ORDER — FENTANYL CITRATE (PF) 100 MCG/2ML IJ SOLN
25.0000 ug | Freq: Once | INTRAMUSCULAR | Status: AC
  Administered 2016-09-14: 25 ug via INTRAVENOUS

## 2016-09-14 MED ORDER — MIDAZOLAM HCL 2 MG/2ML IJ SOLN
1.0000 mg | INTRAMUSCULAR | Status: AC
  Administered 2016-09-14: 2 mg via INTRAVENOUS

## 2016-09-14 MED ORDER — CYCLOPENTOLATE-PHENYLEPHRINE 0.2-1 % OP SOLN
1.0000 [drp] | OPHTHALMIC | Status: AC
  Administered 2016-09-14 (×3): 1 [drp] via OPHTHALMIC

## 2016-09-14 MED ORDER — FENTANYL CITRATE (PF) 100 MCG/2ML IJ SOLN
INTRAMUSCULAR | Status: AC
  Filled 2016-09-14: qty 2

## 2016-09-14 MED ORDER — MIDAZOLAM HCL 2 MG/2ML IJ SOLN
INTRAMUSCULAR | Status: AC
  Filled 2016-09-14: qty 2

## 2016-09-14 MED ORDER — BSS IO SOLN
INTRAOCULAR | Status: DC | PRN
  Administered 2016-09-14: 15 mL

## 2016-09-14 MED ORDER — TETRACAINE HCL 0.5 % OP SOLN
1.0000 [drp] | OPHTHALMIC | Status: AC
  Administered 2016-09-14 (×3): 1 [drp] via OPHTHALMIC

## 2016-09-14 SURGICAL SUPPLY — 11 items
CLOTH BEACON ORANGE TIMEOUT ST (SAFETY) ×3 IMPLANT
EYE SHIELD UNIVERSAL CLEAR (GAUZE/BANDAGES/DRESSINGS) ×3 IMPLANT
GLOVE BIOGEL PI IND STRL 6.5 (GLOVE) ×1 IMPLANT
GLOVE BIOGEL PI IND STRL 7.0 (GLOVE) ×1 IMPLANT
GLOVE BIOGEL PI INDICATOR 6.5 (GLOVE) ×2
GLOVE BIOGEL PI INDICATOR 7.0 (GLOVE) ×2
LENS ALC ACRYL/TECN (Ophthalmic Related) ×3 IMPLANT
PAD ARMBOARD 7.5X6 YLW CONV (MISCELLANEOUS) ×3 IMPLANT
TAPE SURG TRANSPORE 1 IN (GAUZE/BANDAGES/DRESSINGS) ×1 IMPLANT
TAPE SURGICAL TRANSPORE 1 IN (GAUZE/BANDAGES/DRESSINGS) ×2
WATER STERILE IRR 250ML POUR (IV SOLUTION) ×3 IMPLANT

## 2016-09-14 NOTE — Op Note (Signed)
Patient brought to the operating room and prepped and draped in the usual manner.  Lid speculum inserted in left eye.  Stab incision made at the twelve o'clock position.  Provisc instilled in the anterior chamber.   A 2.4 mm. Stab incision was made temporally.  An anterior capsulotomy was done with a bent 25 gauge needle.  The nucleus was hydrodissected.  The Phaco tip was inserted in the anterior chamber and the nucleus was emulsified.  CDE was 9.32.  The cortical material was then removed with the I and A tip.  Posterior capsule was the polished.  The anterior chamber was deepened with Provisc.  A 18.0 Diopter Alcon AU00T0 IOL was then inserted in the capsular bag.  Provisc was then removed with the I and A tip.  The wound was then hydrated.  Patient sent to the Recovery Room in good condition with follow up in my office.  Preoperative Diagnosis:  Nuclear Cataract OS Postoperative Diagnosis:  Same Procedure name: Kelman Phacoemulsification OS with IOL

## 2016-09-14 NOTE — H&P (Signed)
The patient was re examined and there is no change in the patients condition since the original H and P. 

## 2016-09-14 NOTE — Anesthesia Postprocedure Evaluation (Signed)
Anesthesia Post Note  Patient: Jane Wilkerson  Procedure(s) Performed: Procedure(s) (LRB): CATARACT EXTRACTION PHACO AND INTRAOCULAR LENS PLACEMENT (IOC) (Left)  Patient location during evaluation: PACU Anesthesia Type: MAC Level of consciousness: awake and alert, oriented and patient cooperative Pain management: pain level controlled Vital Signs Assessment: post-procedure vital signs reviewed and stable Respiratory status: spontaneous breathing, nonlabored ventilation and respiratory function stable Cardiovascular status: blood pressure returned to baseline Postop Assessment: no signs of nausea or vomiting Anesthetic complications: no     Last Vitals:  Vitals:   09/14/16 0745 09/14/16 0800  BP: 137/68 134/67  Resp: (!) 38 16  Temp:      Last Pain:  Vitals:   09/14/16 0709  TempSrc: Oral                 Berlyn Saylor J

## 2016-09-14 NOTE — Transfer of Care (Signed)
Immediate Anesthesia Transfer of Care Note  Patient: Jane Wilkerson  Procedure(s) Performed: Procedure(s) with comments: CATARACT EXTRACTION PHACO AND INTRAOCULAR LENS PLACEMENT (IOC) (Left) - CDE: 9.32  Patient Location: Short Stay  Anesthesia Type:MAC  Level of Consciousness: awake  Airway & Oxygen Therapy: Patient Spontanous Breathing  Post-op Assessment: Report given to RN, Post -op Vital signs reviewed and stable and Patient moving all extremities  Post vital signs: Reviewed and stable  Last Vitals:  Vitals:   09/14/16 0745 09/14/16 0800  BP: 137/68 134/67  Resp: (!) 38 16  Temp:      Last Pain:  Vitals:   09/14/16 0709  TempSrc: Oral      Patients Stated Pain Goal: 7 (03/88/82 8003)  Complications: No apparent anesthesia complications

## 2016-09-14 NOTE — Discharge Instructions (Signed)
°  °          [facility] Instructions [place] [address]-[place]      1. Avoid closing eyes tightly. One often closes the eye tightly when laughing, talking, sneezing, coughing or if they feel irritated. At these times, you should be careful not to close your eyes tightly.  2. Instill eye drops as instructed. To instill drops in your eye, open it, look up and have someone gently pull the lower lid down and instill a couple of drops inside the lower lid.  3. Do not touch upper lid.  4. Take Advil or Tylenol for pain.  5. You may use either eye for near work, such as reading or sewing and you may watch television.  6. You may have your hair done at the beauty parlor at any time.  7. Wear dark glasses with or without your own glasses if you are in bright light.  8. Call our office at [phone] or [phone] if you have sharp pain in your eye or unusual symptoms.  9.  FOLLOW UP WITH DR. SHAPIRO TODAY IN HIS [place] OFFICE AT  3:00 pm.    I have received a copy of the above instructions and will follow them.     IF YOU ARE IN IMMEDIATE DANGER CALL 911!  It is important for you to keep your follow-up appointment with your physician after discharge, OR, for you /your caregiver to make a follow-up appointment with your physician / medical provider after discharge.  Show these instructions to the next healthcare provider you see.

## 2016-09-14 NOTE — Anesthesia Preprocedure Evaluation (Signed)
Anesthesia Evaluation  Patient identified by MRN, date of birth, ID band Patient awake    Reviewed: Allergy & Precautions, H&P , NPO status , Patient's Chart, lab work & pertinent test results, reviewed documented beta blocker date and time   History of Anesthesia Complications (+) PONV and history of anesthetic complications  Airway Mallampati: II  TM Distance: >3 FB Neck ROM: Full    Dental  (+) Teeth Intact, Dental Advisory Given   Pulmonary shortness of breath and with exertion, asthma ,    breath sounds clear to auscultation       Cardiovascular hypertension, Pt. on medications + DOE   Rhythm:Regular Rate:Normal     Neuro/Psych PSYCHIATRIC DISORDERS Anxiety Depression    GI/Hepatic PUD, GERD  Medicated and Controlled,  Endo/Other  Hypothyroidism   Renal/GU      Musculoskeletal   Abdominal   Peds  Hematology   Anesthesia Other Findings   Reproductive/Obstetrics                             Anesthesia Physical Anesthesia Plan  ASA: III  Anesthesia Plan: MAC   Post-op Pain Management:    Induction: Intravenous  Airway Management Planned: Nasal Cannula  Additional Equipment:   Intra-op Plan:   Post-operative Plan:   Informed Consent: I have reviewed the patients History and Physical, chart, labs and discussed the procedure including the risks, benefits and alternatives for the proposed anesthesia with the patient or authorized representative who has indicated his/her understanding and acceptance.     Plan Discussed with:   Anesthesia Plan Comments:         Anesthesia Quick Evaluation

## 2016-09-15 ENCOUNTER — Encounter (HOSPITAL_COMMUNITY): Payer: Self-pay | Admitting: Ophthalmology

## 2016-10-04 ENCOUNTER — Ambulatory Visit (INDEPENDENT_AMBULATORY_CARE_PROVIDER_SITE_OTHER): Payer: Medicare Other | Admitting: Internal Medicine

## 2016-10-04 ENCOUNTER — Encounter: Payer: Self-pay | Admitting: Internal Medicine

## 2016-10-04 VITALS — BP 142/90 | HR 86 | Ht 63.0 in | Wt 163.0 lb

## 2016-10-04 DIAGNOSIS — E039 Hypothyroidism, unspecified: Secondary | ICD-10-CM | POA: Diagnosis not present

## 2016-10-04 DIAGNOSIS — R0989 Other specified symptoms and signs involving the circulatory and respiratory systems: Secondary | ICD-10-CM

## 2016-10-04 DIAGNOSIS — I10 Essential (primary) hypertension: Secondary | ICD-10-CM | POA: Diagnosis not present

## 2016-10-04 DIAGNOSIS — E785 Hyperlipidemia, unspecified: Secondary | ICD-10-CM | POA: Diagnosis not present

## 2016-10-04 DIAGNOSIS — I77810 Thoracic aortic ectasia: Secondary | ICD-10-CM

## 2016-10-04 DIAGNOSIS — Z79899 Other long term (current) drug therapy: Secondary | ICD-10-CM

## 2016-10-04 LAB — COMPREHENSIVE METABOLIC PANEL
ALK PHOS: 62 U/L (ref 33–130)
ALT: 11 U/L (ref 6–29)
AST: 13 U/L (ref 10–35)
Albumin: 4.3 g/dL (ref 3.6–5.1)
BILIRUBIN TOTAL: 0.5 mg/dL (ref 0.2–1.2)
BUN: 19 mg/dL (ref 7–25)
CALCIUM: 9.1 mg/dL (ref 8.6–10.4)
CO2: 27 mmol/L (ref 20–31)
Chloride: 101 mmol/L (ref 98–110)
Creat: 1.21 mg/dL — ABNORMAL HIGH (ref 0.60–0.93)
GLUCOSE: 108 mg/dL — AB (ref 65–99)
Potassium: 4.3 mmol/L (ref 3.5–5.3)
Sodium: 140 mmol/L (ref 135–146)
TOTAL PROTEIN: 6.7 g/dL (ref 6.1–8.1)

## 2016-10-04 LAB — LIPID PANEL
Cholesterol: 186 mg/dL (ref <200)
HDL: 56 mg/dL (ref >50)
LDL Cholesterol: 88 mg/dL (ref <100)
TRIGLYCERIDES: 211 mg/dL — AB (ref <150)
Total CHOL/HDL Ratio: 3.3 Ratio (ref <5.0)
VLDL: 42 mg/dL — ABNORMAL HIGH (ref <30)

## 2016-10-04 NOTE — Progress Notes (Signed)
OFFICE NOTE  Chief Complaint:  Cough, GERD Symptoms  Primary Care Physician: Jane Herring, MD  HPI:  Jane Wilkerson  is a 76 year old female with a history of PVCs and bigeminy but had a stress test in May which was negative and that seemed to have resolved. She has had some reflux symptoms as well which improved. Unfortunately over the past year I understand she was diagnosed with breast cancer which is DCIS and underwent local excision and is on chemotherapy. She seems to be doing well and is about a year out from that, and fortunately that was caught early on mammogram. From a cardiac standpoint she denies any chest pain, worsening shortness of breath, palpitations, presyncope or syncopal symptoms.  Recently she had been told that she had some abnormalities with her renal function. She was referred to a nephrologist who she saw a couple times and underwent a 24-hour urine in additional blood work. She was subsequently released. She's not had any recent lipid profile testing not aware of. She continues to have some palpitations although they're not quite as bothersome.  She is seeing Dr. Hinda Lenis, who feels that her renal function is stable. He recently increased her by Bystolic to 20 mg daily.  Jane Wilkerson returns today for follow-up. Recently she's been a little more anxious and she's noted some lability in her blood pressures. At times her blood pressure was as low as 80/50 but can run high. She's also felt some fatigue, some aching or tightness in her chest, particularly with sitting up at night and some progressive increase in shortness of breath. Blood pressure today was elevated in the office 162/98. She's recently had some medication timing adjustments by her primary care provider.  I saw Jane Wilkerson back today in the office. Overall she is feeling so slightly better. She does have some labile blood pressures. She underwent nuclear stress testing which was negative for ischemia and  showed normal LV function. Her echo also showed normal LV function. There was borderline enlarged aortic root at 3.7 cm.  10/04/2016  Jane Wilkerson returns for follow-up. She reports recently she's been having some worsening cough and upper airway wheezing. She recently had bilateral cataract surgery which was uneventful. She thinks her symptoms are likely related to GERD and has a follow-up appointment with Dr. Laural Golden in GI. Blood pressure is 142/90 today. She reports some lability in her blood pressures. Namely, she gets some lower blood pressures at night. Recent she was taken off of HCTZ as of low sodium. This is not been recently reassessed. She also has borderline dilatation of the ascending aorta 3.7 cm seen on echo last year. LVEF at the time was 50-55%.  PMHx:  Past Medical History:  Diagnosis Date  . Anxiety 07/07/2011   takes Ativan every 4hrs  . Arthritis    back   . Asthma    uses inhaler prn  . Breast cancer ([place])    right - local excision, chemotheraphy  . Cataract    right eye  . Chronic back pain   . Depression    has appt to discuss depression 07/26/2011  . Diverticulosis   . Dizziness    occasionally  . Gastric ulcer   . GERD (gastroesophageal reflux disease)    takes Nexium daily  . Hemorrhoids   . History of nuclear stress test 11/2010   lexiscan; no evidence of inducible ischemia; normal pattern of perfusion   . History of shingles 2011  .  Hx of colonic polyps   . Hyperlipidemia    takes Zocor daily  . Hypertension    takes Bystolic and Losartan daily  . Hypothyroidism    takes Synthroid daily  . Nausea alone    onset last Wednesday  08/04/11  . Nocturia   . Osteoporosis   . PONV (postoperative nausea and vomiting)   . PVC's (premature ventricular contractions)   . Shortness of breath    with exertion  . Urinary frequency     Past Surgical History:  Procedure Laterality Date  . ABDOMINAL HYSTERECTOMY  2001  . BREAST LUMPECTOMY Right 07/02/11   right  breast and SNL  . BREAST SURGERY Left 1972   left lumpectomy  . BREAST SURGERY Right 08/11/11   margins on rt breast  . CATARACT EXTRACTION W/PHACO Right 08/31/2016   Procedure: CATARACT EXTRACTION PHACO AND INTRAOCULAR LENS PLACEMENT ([place]);  Surgeon: Rutherford Guys, MD;  Location: [facility];  Service: Ophthalmology;  Laterality: Right;  CDE: 8.46  . CATARACT EXTRACTION W/PHACO Left 09/14/2016   Procedure: CATARACT EXTRACTION PHACO AND INTRAOCULAR LENS PLACEMENT ([facility]);  Surgeon: Rutherford Guys, MD;  Location: [facility];  Service: Ophthalmology;  Laterality: Left;  CDE: 9.32  . colonscopy    . HEMORRHOID SURGERY    . TRANSTHORACIC ECHOCARDIOGRAM  2009   normal LV & RV systolic function; mild mitral annular calcif & mild MR; trace TR    FAMHx:  Family History  Problem Relation Age of Onset  . Colon cancer Mother     also stroke  . Pancreatic cancer Father     also heart disease  . Diabetes Sister   . Hypertension Sister   . Anesthesia problems Neg Hx   . Hypotension Neg Hx   . Malignant hyperthermia Neg Hx   . Pseudochol deficiency Neg Hx     SOCHx:   reports that she has never smoked. She has never used smokeless tobacco. She reports that she does not drink alcohol or use drugs.  ALLERGIES:  Allergies  Allergen Reactions  . Hydrocodone Other (See Comments)    Reaction unknown  . Oxycodone Hcl Itching  . Ziac [Bisoprolol-Hydrochlorothiazide] Other (See Comments)    Reaction unknown  . Penicillins Rash    Has patient had a PCN reaction causing immediate rash, facial/tongue/throat swelling, SOB or lightheadedness with hypotension: unknown Has patient had a PCN reaction causing severe rash involving mucus membranes or skin necrosis: unknown Has patient had a PCN reaction that required hospitalization No Has patient had a PCN reaction occurring within the last 10 years: No If all of the above answers are "NO", then may proceed with Cephalosporin use.     ROS: Pertinent items noted in  HPI and remainder of comprehensive ROS otherwise negative.  HOME MEDS: Current Outpatient Prescriptions  Medication Sig Dispense Refill  . albuterol (PROVENTIL HFA;VENTOLIN HFA) 108 (90 BASE) MCG/ACT inhaler Inhale 2 puffs into the lungs every 6 (six) hours as needed for wheezing or shortness of breath.     Marland Kitchen alendronate (FOSAMAX) 70 MG tablet Take 1 tablet by mouth once a week. Wednesday    . anastrozole (ARIMIDEX) 1 MG tablet TAKE 1 TABLET DAILY 90 tablet 3  . Calcium Carbonate (CALTRATE 600 PO) Take 600 mg by mouth daily.     . citalopram (CELEXA) 20 MG tablet Take 20 mg by mouth daily.    Marland Kitchen esomeprazole (NEXIUM) 40 MG capsule Take 40 mg by mouth daily.     . fish oil-omega-3 fatty  acids 1000 MG capsule Take 1 g by mouth daily.     . hydrocortisone cream 0.5 % Apply 1 application topically 2 (two) times daily as needed for itching. For hemorrhoid pain    . levothyroxine (SYNTHROID, LEVOTHROID) 50 MCG tablet Take 50 mcg by mouth daily.    Marland Kitchen LORazepam (ATIVAN) 1 MG tablet Take 1 mg by mouth at bedtime. For anxiety    . losartan (COZAAR) 100 MG tablet Take 100 mg by mouth daily.     . nebivolol (BYSTOLIC) 10 MG tablet Take 1 tablet (10 mg total) by mouth daily. 30 tablet 6  . simvastatin (ZOCOR) 40 MG tablet Take 40 mg by mouth at bedtime.       No current facility-administered medications for this visit.     LABS/IMAGING: No results found for this or any previous visit (from the past 48 hour(s)). No results found.  VITALS: BP (!) 142/90   Pulse 86   Ht 5\' 3"  (1.6 m)   Wt 163 lb (73.9 kg)   SpO2 94%   BMI 28.87 kg/m   EXAM: General appearance: alert and no distress Neck: no carotid bruit and no JVD Lungs: clear to auscultation bilaterally Heart: regular rate and rhythm Abdomen: soft, non-tender; bowel sounds normal; no masses,  no organomegaly Extremities: extremities normal, atraumatic, no cyanosis or edema Pulses: 2+ and symmetric Skin: Skin color, texture, turgor normal.  No rashes or lesions Neurologic: Grossly normal Psych: Pleasant, mildly anxious  EKG: Normal sinus rhythm at 82  ASSESSMENT: 1. Chest pressure/dyspnea on exertion - low risk stress test and echocardiogram with normal LV function (08/2015) 2. Top normal ascending aorta 3.7 cm 3. Palpitations-generally well controlled 4. Dyslipidemia-due for a cholesterol check 5. Recent history of breast cancer 6. Abnormal renal function by report 7. Labile hypertension 8. Anxiety  PLAN: 1.   Ms. Piggott continues to have some labile blood pressures which is likely related to anxiety. She's had some nocturnal hypotension. I advised her that she could consider splitting her losartan into 50 mg (one half tablets) and taking 1 in the morning and 1 at night. She is ready tried taking Bystolic at night without improvement. She is due for repeat assessment of the ascending aorta had recommended a CT angiogram of the aorta which is more comprehensive and less expensive than an echo. She is also due for repeat lipid profile and will order that along with comprehensive metabolic profile to check her sodium and a TSH.  Follow-up annually or sooner as necessary.  Pixie Casino, MD, [facility] Attending Cardiologist Affton C Hilty 10/04/2016, 9:48 AM

## 2016-10-04 NOTE — Patient Instructions (Signed)
Your physician recommends that you return for lab work FASTING  Non-Cardiac CT scanning, (CAT scanning), is a noninvasive, special x-ray that produces cross-sectional images of the body using x-rays and a computer. CT scans help physicians diagnose and treat medical conditions. For some CT exams, a contrast material is used to enhance visibility in the area of the body being studied. CT scans provide greater clarity and reveal more details than regular x-ray exams.  Your physician wants you to follow-up in: ONE YEAR with Dr. Debara Pickett. You will receive a reminder letter in the mail two months in advance. If you don't receive a letter, please call our office to schedule the follow-up appointment.

## 2016-10-05 LAB — TSH: TSH: 2.86 mIU/L

## 2016-10-06 DIAGNOSIS — Z6829 Body mass index (BMI) 29.0-29.9, adult: Secondary | ICD-10-CM | POA: Diagnosis not present

## 2016-10-06 DIAGNOSIS — K219 Gastro-esophageal reflux disease without esophagitis: Secondary | ICD-10-CM | POA: Diagnosis not present

## 2016-10-12 ENCOUNTER — Encounter (INDEPENDENT_AMBULATORY_CARE_PROVIDER_SITE_OTHER): Payer: Self-pay | Admitting: Internal Medicine

## 2016-10-14 ENCOUNTER — Ambulatory Visit (HOSPITAL_COMMUNITY)
Admission: RE | Admit: 2016-10-14 | Discharge: 2016-10-14 | Disposition: A | Discharge status: Home / Self Care | Payer: Medicare Other | Source: Ambulatory Visit | Attending: Internal Medicine | Admitting: Internal Medicine

## 2016-10-14 ENCOUNTER — Telehealth: Payer: Self-pay | Admitting: Internal Medicine

## 2016-10-14 DIAGNOSIS — I700 Atherosclerosis of aorta: Secondary | ICD-10-CM | POA: Diagnosis not present

## 2016-10-14 DIAGNOSIS — R062 Wheezing: Secondary | ICD-10-CM | POA: Diagnosis not present

## 2016-10-14 DIAGNOSIS — I77810 Thoracic aortic ectasia: Secondary | ICD-10-CM | POA: Insufficient documentation

## 2016-10-14 DIAGNOSIS — R05 Cough: Secondary | ICD-10-CM | POA: Diagnosis not present

## 2016-10-14 DIAGNOSIS — R932 Abnormal findings on diagnostic imaging of liver and biliary tract: Secondary | ICD-10-CM | POA: Insufficient documentation

## 2016-10-14 DIAGNOSIS — R928 Other abnormal and inconclusive findings on diagnostic imaging of breast: Secondary | ICD-10-CM | POA: Diagnosis not present

## 2016-10-14 MED ORDER — IOPAMIDOL (ISOVUE-370) INJECTION 76%
80.0000 mL | Freq: Once | INTRAVENOUS | Status: AC | PRN
  Administered 2016-10-14: 80 mL via INTRAVENOUS

## 2016-10-14 NOTE — Telephone Encounter (Signed)
Received call from [facility] at [facility] calling to report abnormal chest cta Stated report in Epic.Advised I will send message to Dr.Hilty.  IMPRESSION: 1. Ascending thoracic aorta measures up to 36 mm. Mild calcific atherosclerosis of the thoracic aorta. 2. 8 mm hyperdense focus in right lobe of liver, probably a flash hemangioma. 3. 2.4 cm soft tissue focus in the right central breast overlying the pectoralis muscle of uncertain significance, probably corresponding to postsurgical changes on prior mammogram. Correlation with breast imaging and clinical follow-up is recommended given history of breast cancer. These results will be called to the ordering clinician or representative by the Radiologist Assistant, and communication documented in the PACS or zVision Dashboard.

## 2016-10-15 NOTE — Telephone Encounter (Signed)
MD has reviewed report

## 2016-10-21 ENCOUNTER — Other Ambulatory Visit: Payer: Self-pay | Admitting: Emergency Medicine

## 2016-10-25 ENCOUNTER — Encounter (INDEPENDENT_AMBULATORY_CARE_PROVIDER_SITE_OTHER): Payer: Self-pay | Admitting: *Deleted

## 2016-10-25 ENCOUNTER — Ambulatory Visit (INDEPENDENT_AMBULATORY_CARE_PROVIDER_SITE_OTHER): Payer: Medicare Other | Admitting: Internal Medicine

## 2016-10-25 ENCOUNTER — Encounter (INDEPENDENT_AMBULATORY_CARE_PROVIDER_SITE_OTHER): Payer: Self-pay | Admitting: Internal Medicine

## 2016-10-25 ENCOUNTER — Encounter (INDEPENDENT_AMBULATORY_CARE_PROVIDER_SITE_OTHER): Payer: Self-pay

## 2016-10-25 VITALS — BP 160/80 | HR 60 | Temp 98.3°F | Ht 62.0 in | Wt 162.6 lb

## 2016-10-25 DIAGNOSIS — R1319 Other dysphagia: Secondary | ICD-10-CM

## 2016-10-25 DIAGNOSIS — R1310 Dysphagia, unspecified: Secondary | ICD-10-CM

## 2016-10-25 DIAGNOSIS — Z800 Family history of malignant neoplasm of digestive organs: Secondary | ICD-10-CM | POA: Diagnosis not present

## 2016-10-25 NOTE — Progress Notes (Addendum)
Subjective:    Patient ID: Jane Wilkerson, female    DOB: 05-09-1941, 76 y.o.   MRN: [id number]  HPI Referred by Dr Gerarda Fraction for TCS and acid reflux.Family hx of colon cancer in a mother. She thinks her last colonoscopy was in 2011 in [place] with ?Her last colonoscopy was in 2011 with removal of one polyp. Biopsy: Hyperplastic polyp. She tells me she is doing okay.  She says she has IBS which is not everyday. She says she has constipated and takes stool softener. She has a BM at least every 3 days. Her appetite is okay. No weight loss.  She says her acid reflux is bad. She has been taking Nexium once a day.  The Nexium helps with her acid reflux. She says foods are lodging in her esophagus. She coughs.   She props herself up at night.   02/11/2005 Colonoscopy: Family hx of colon cancer in a mother.  Few diverticula at sigmoid colon.   Review of Systems Past Medical History:  Diagnosis Date  . Anxiety 07/07/2011   takes Ativan every 4hrs  . Arthritis    back   . Asthma    uses inhaler prn  . Breast cancer ([place])    right - local excision, chemotheraphy  . Cataract    right eye  . Chronic back pain   . Depression    has appt to discuss depression 07/26/2011  . Diverticulosis   . Dizziness    occasionally  . Gastric ulcer   . GERD (gastroesophageal reflux disease)    takes Nexium daily  . Hemorrhoids   . History of nuclear stress test 11/2010   lexiscan; no evidence of inducible ischemia; normal pattern of perfusion   . History of shingles 2011  . Hx of colonic polyps   . Hyperlipidemia    takes Zocor daily  . Hypertension    takes Bystolic and Losartan daily  . Hypothyroidism    takes Synthroid daily  . Nausea alone    onset last Wednesday  08/04/11  . Nocturia   . Osteoporosis   . PONV (postoperative nausea and vomiting)   . PVC's (premature ventricular contractions)   . Shortness of breath    with exertion  . Urinary frequency     Past Surgical History:  Procedure  Laterality Date  . ABDOMINAL HYSTERECTOMY  2001  . BREAST LUMPECTOMY Right 07/02/11   right breast and SNL  . BREAST SURGERY Left 1972   left lumpectomy  . BREAST SURGERY Right 08/11/11   margins on rt breast  . CATARACT EXTRACTION W/PHACO Right 08/31/2016   Procedure: CATARACT EXTRACTION PHACO AND INTRAOCULAR LENS PLACEMENT ([place]);  Surgeon: Rutherford Guys, MD;  Location: [facility];  Service: Ophthalmology;  Laterality: Right;  CDE: 8.46  . CATARACT EXTRACTION W/PHACO Left 09/14/2016   Procedure: CATARACT EXTRACTION PHACO AND INTRAOCULAR LENS PLACEMENT ([facility]);  Surgeon: Rutherford Guys, MD;  Location: [facility];  Service: Ophthalmology;  Laterality: Left;  CDE: 9.32  . colonscopy    . HEMORRHOID SURGERY    . TRANSTHORACIC ECHOCARDIOGRAM  2009   normal LV & RV systolic function; mild mitral annular calcif & mild MR; trace TR    Allergies  Allergen Reactions  . Hydrocodone Other (See Comments)    Reaction unknown  . Oxycodone Hcl Itching  . Ziac [Bisoprolol-Hydrochlorothiazide] Other (See Comments)    Reaction unknown  . Penicillins Rash    Has patient had a PCN reaction causing immediate rash, facial/tongue/throat swelling, SOB  or lightheadedness with hypotension: unknown Has patient had a PCN reaction causing severe rash involving mucus membranes or skin necrosis: unknown Has patient had a PCN reaction that required hospitalization No Has patient had a PCN reaction occurring within the last 10 years: No If all of the above answers are "NO", then may proceed with Cephalosporin use.     Current Outpatient Prescriptions on File Prior to Visit  Medication Sig Dispense Refill  . albuterol (PROVENTIL HFA;VENTOLIN HFA) 108 (90 BASE) MCG/ACT inhaler Inhale 2 puffs into the lungs every 6 (six) hours as needed for wheezing or shortness of breath.     Marland Kitchen alendronate (FOSAMAX) 70 MG tablet Take 1 tablet by mouth once a week. Wednesday    . anastrozole (ARIMIDEX) 1 MG tablet TAKE 1 TABLET DAILY 90 tablet 3    . Calcium Carbonate (CALTRATE 600 PO) Take 600 mg by mouth daily.     . citalopram (CELEXA) 20 MG tablet Take 20 mg by mouth daily.    Marland Kitchen esomeprazole (NEXIUM) 40 MG capsule Take 40 mg by mouth daily.     . fish oil-omega-3 fatty acids 1000 MG capsule Take 1 g by mouth daily.     . hydrocortisone cream 0.5 % Apply 1 application topically 2 (two) times daily as needed for itching. For hemorrhoid pain    . levothyroxine (SYNTHROID, LEVOTHROID) 50 MCG tablet Take 50 mcg by mouth daily.    Marland Kitchen LORazepam (ATIVAN) 1 MG tablet Take 1 mg by mouth at bedtime. For anxiety    . losartan (COZAAR) 100 MG tablet Take 100 mg by mouth daily.     . nebivolol (BYSTOLIC) 10 MG tablet Take 1 tablet (10 mg total) by mouth daily. 30 tablet 6  . simvastatin (ZOCOR) 40 MG tablet Take 40 mg by mouth at bedtime.       No current facility-administered medications on file prior to visit.        Objective:   Physical Exam Blood pressure (!) 160/80, pulse 60, temperature 98.3 F (36.8 C), height 5\' 2"  (1.575 m), weight 162 lb 9.6 oz (73.8 kg).  Alert and oriented. Skin warm and dry. Oral mucosa is moist.   . Sclera anicteric, conjunctivae is pink. Thyroid not enlarged. No cervical lymphadenopathy. Lungs clear. Heart regular rate and rhythm.  Abdomen is soft. Bowel sounds are positive. No hepatomegaly. No abdominal masses felt. No tenderness.  No edema to lower extremities.        Assessment & Plan:  GERD/Dysphagia. Am going to get an Esophagram. Screening colonoscopy for family hx of colon cancer

## 2016-10-25 NOTE — Patient Instructions (Signed)
Will set up colonoscopy once we have Esophagram back.

## 2016-10-29 ENCOUNTER — Ambulatory Visit (HOSPITAL_COMMUNITY)
Admission: RE | Admit: 2016-10-29 | Discharge: 2016-10-29 | Disposition: A | Discharge status: Home / Self Care | Payer: Medicare Other | Source: Ambulatory Visit | Attending: Internal Medicine | Admitting: Internal Medicine

## 2016-10-29 DIAGNOSIS — K224 Dyskinesia of esophagus: Secondary | ICD-10-CM | POA: Insufficient documentation

## 2016-10-29 DIAGNOSIS — K228 Other specified diseases of esophagus: Secondary | ICD-10-CM | POA: Insufficient documentation

## 2016-10-29 DIAGNOSIS — R1310 Dysphagia, unspecified: Secondary | ICD-10-CM

## 2016-10-29 DIAGNOSIS — R1319 Other dysphagia: Secondary | ICD-10-CM

## 2016-11-01 ENCOUNTER — Telehealth (INDEPENDENT_AMBULATORY_CARE_PROVIDER_SITE_OTHER): Payer: Self-pay | Admitting: Internal Medicine

## 2016-11-01 NOTE — Telephone Encounter (Signed)
Jane Wilkerson, patient will call u when she is ready to schedule colonoscopy. Family hx of colon cancer. Last colonoscopy in 2011.  She is due.

## 2016-11-16 ENCOUNTER — Ambulatory Visit: Payer: Medicare Other | Admitting: Hematology and Oncology

## 2016-11-30 ENCOUNTER — Ambulatory Visit (HOSPITAL_BASED_OUTPATIENT_CLINIC_OR_DEPARTMENT_OTHER): Payer: Medicare Other | Admitting: Hematology and Oncology

## 2016-11-30 ENCOUNTER — Encounter: Payer: Self-pay | Admitting: Hematology and Oncology

## 2016-11-30 DIAGNOSIS — Z86000 Personal history of in-situ neoplasm of breast: Secondary | ICD-10-CM | POA: Diagnosis not present

## 2016-11-30 DIAGNOSIS — D0511 Intraductal carcinoma in situ of right breast: Secondary | ICD-10-CM

## 2016-11-30 NOTE — Assessment & Plan Note (Signed)
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 The final pathology revealed a 1.5 cm DCIS that is ER positive PR negative on Arimidex Since May 2013  Aromatase inhibitor counseling: Tolerating anastrozole very well Complaints of fatigue attributable to inactivity. Patient will complete 5 years of therapy by March 2018 and will stop treatment at that time.I discussed with her that there is no role of extended adjuvant therapy for DCIS.  Breast Cancer Surveillance: 1. Breast exam 11/30/2016: Normal 2. Mammogram 06/08/2016 No abnormalities. Postsurgical changes. Breast Density Category B. I recommended that she get 3-D mammograms for surveillance. Discussed the differences between different breast density categories.  Survivorship: I continuously stressed the importance of doing physical exercise.  Return to clinic in 1 year for followup with survivorship clinic.

## 2016-11-30 NOTE — Progress Notes (Signed)
Patient Care Team: Redmond School, MD as PCP - General (Internal Medicine)  DIAGNOSIS:  Encounter Diagnosis  Name Primary?  . Ductal carcinoma in situ (DCIS) of right breast     SUMMARY OF ONCOLOGIC HISTORY:   Malignant neoplasm of upper-outer quadrant of right female breast ([place])   07/02/2011 Surgery    Right lumpectomy: DCIS ER 100%, PR 0%      07/23/2011 - 08/20/2011 Radiation Therapy    Adjuvant XRT      11/19/2011 - 11/30/2016 Anti-estrogen oral therapy    Aromasin 25 mg daily X 1 month then changed to Arimidex 1 mg daily       CHIEF COMPLIANT: Follow-up on antiestrogen therapy,  completed 5 years  INTERVAL HISTORY: Jane Wilkerson is a 76 year old with above-mentioned history of right breast DCIS treated with lumpectomy radiation and took 5 years of antiestrogen therapy. She completed 5 years as of this month. She denies any major problems tolerating it. She has had a lot of stresses in her life especially her recent cataract surgery her husband's prostate cancer surgery in taking care of her children's issues. She does complain of fatigue with minimal exertion.  REVIEW OF SYSTEMS:   Constitutional: Denies fevers, chills or abnormal weight loss Eyes: Denies blurriness of vision Ears, nose, mouth, throat, and face: Denies mucositis or sore throat Respiratory: Denies cough, dyspnea or wheezes Cardiovascular: Denies palpitation, chest discomfort Gastrointestinal:  Denies nausea, heartburn or change in bowel habits Skin: Denies abnormal skin rashes Lymphatics: Denies new lymphadenopathy or easy bruising Neurological:Denies numbness, tingling or new weaknesses Behavioral/Psych: Mood is stable, no new changes  Extremities: No lower extremity edema Breast:  denies any pain or lumps or nodules in either breasts All other systems were reviewed with the patient and are negative.  I have reviewed the past medical history, past surgical history, social history and family  history with the patient and they are unchanged from previous note.  ALLERGIES:  is allergic to hydrocodone; oxycodone hcl; ziac [bisoprolol-hydrochlorothiazide]; and penicillins.  MEDICATIONS:  Current Outpatient Prescriptions  Medication Sig Dispense Refill  . albuterol (PROVENTIL HFA;VENTOLIN HFA) 108 (90 BASE) MCG/ACT inhaler Inhale 2 puffs into the lungs every 6 (six) hours as needed for wheezing or shortness of breath.     Marland Kitchen alendronate (FOSAMAX) 70 MG tablet Take 1 tablet by mouth once a week. Wednesday    . Calcium Carbonate (CALTRATE 600 PO) Take 600 mg by mouth daily.     . citalopram (CELEXA) 20 MG tablet Take 20 mg by mouth daily.    Marland Kitchen esomeprazole (NEXIUM) 40 MG capsule Take 40 mg by mouth daily.     . fish oil-omega-3 fatty acids 1000 MG capsule Take 1 g by mouth daily.     . hydrocortisone cream 0.5 % Apply 1 application topically 2 (two) times daily as needed for itching. For hemorrhoid pain    . levothyroxine (SYNTHROID, LEVOTHROID) 50 MCG tablet Take 50 mcg by mouth daily.    Marland Kitchen LORazepam (ATIVAN) 1 MG tablet Take 1 mg by mouth at bedtime. For anxiety    . losartan (COZAAR) 100 MG tablet Take 100 mg by mouth daily.     . nebivolol (BYSTOLIC) 10 MG tablet Take 1 tablet (10 mg total) by mouth daily. 30 tablet 6  . simvastatin (ZOCOR) 40 MG tablet Take 40 mg by mouth at bedtime.       No current facility-administered medications for this visit.     PHYSICAL EXAMINATION: ECOG PERFORMANCE STATUS:  1 - Symptomatic but completely ambulatory  Vitals:   11/30/16 1528  BP: (!) 160/83  Pulse: 78  Resp: 18  Temp: 98.2 F (36.8 C)   Filed Weights   11/30/16 1528  Weight: 163 lb 12.8 oz (74.3 kg)    GENERAL:alert, no distress and comfortable SKIN: skin color, texture, turgor are normal, no rashes or significant lesions EYES: normal, Conjunctiva are pink and non-injected, sclera clear OROPHARYNX:no exudate, no erythema and lips, buccal mucosa, and tongue normal  NECK:  supple, thyroid normal size, non-tender, without nodularity LYMPH:  no palpable lymphadenopathy in the cervical, axillary or inguinal LUNGS: clear to auscultation and percussion with normal breathing effort HEART: regular rate & rhythm and no murmurs and no lower extremity edema ABDOMEN:abdomen soft, non-tender and normal bowel sounds MUSCULOSKELETAL:no cyanosis of digits and no clubbing  NEURO: alert & oriented x 3 with fluent speech, no focal motor/sensory deficits EXTREMITIES: No lower extremity edema BREAST: No palpable masses or nodules in either right or left breasts. No palpable axillary supraclavicular or infraclavicular adenopathy no breast tenderness or nipple discharge. (exam performed in the presence of a chaperone)  LABORATORY DATA:  I have reviewed the data as listed   Chemistry      Component Value Date/Time   NA 140 10/04/2016 1022   NA 139 11/07/2014 1021   K 4.3 10/04/2016 1022   K 4.3 11/07/2014 1021   CL 101 10/04/2016 1022   CL 100 08/04/2012 0921   CO2 27 10/04/2016 1022   CO2 26 11/07/2014 1021   BUN 19 10/04/2016 1022   BUN 12.9 11/07/2014 1021   CREATININE 1.21 (H) 10/04/2016 1022   CREATININE 1.2 (H) 11/07/2014 1021      Component Value Date/Time   CALCIUM 9.1 10/04/2016 1022   CALCIUM 9.1 11/07/2014 1021   ALKPHOS 62 10/04/2016 1022   ALKPHOS 67 11/07/2014 1021   AST 13 10/04/2016 1022   AST 13 11/07/2014 1021   ALT 11 10/04/2016 1022   ALT 11 11/07/2014 1021   BILITOT 0.5 10/04/2016 1022   BILITOT 0.41 11/07/2014 1021       Lab Results  Component Value Date   WBC 8.3 08/30/2016   HGB 11.5 (L) 08/30/2016   HCT 36.0 08/30/2016   MCV 90.5 08/30/2016   PLT 293 08/30/2016   NEUTROABS 5.9 08/30/2016    ASSESSMENT & PLAN:  Ductal carcinoma in situ (DCIS) of right breast High-grade ductal carcinoma in situ of the right breast status post lumpectomy with sentinel node biopsy. The final pathology revealed a 1.5 cm DCIS that is ER positive PR  negative on Arimidex Since May 2013  Aromatase inhibitor counseling: Tolerating anastrozole very well Complaints of fatigue attributable to inactivity. Patient Completed 5 years of therapy by March 2018 and will stop treatment. I discussed with her that there is no role of extended adjuvant therapy for DCIS.  Breast Cancer Surveillance: 1. Breast exam 11/30/2016: Normal 2. Mammogram 06/08/2016 No abnormalities. Postsurgical changes. Breast Density Category B. I recommended that she get 3-D mammograms for surveillance. Discussed the differences between different breast density categories.  Survivorship: I continuously stressed the importance of doing physical exercise.  Return to clinic in 1 year for followup with survivorship clinic.  I spent 25 minutes talking to the patient of which more than half was spent in counseling and coordination of care.  Orders Placed This Encounter  Procedures  . MM DIAG BREAST TOMO UNI RIGHT    Standing Status:   Future  Standing Expiration Date:   01/30/2018    Order Specific Question:   Reason for Exam (SYMPTOM  OR DIAGNOSIS REQUIRED)    Answer:   Right breast lump noted on CT scan, evaluation    Order Specific Question:   Preferred imaging location?    Answer:   [facility]  . Amb Referral to Survivorship Long term    Referral Priority:   Routine    Referral Type:   Consultation    Number of Visits Requested:   1   The patient has a good understanding of the overall plan. she agrees with it. she will call with any problems that may develop before the next visit here.   Rulon Eisenmenger, MD 11/30/16

## 2017-01-18 DIAGNOSIS — Z961 Presence of intraocular lens: Secondary | ICD-10-CM | POA: Diagnosis not present

## 2017-01-25 ENCOUNTER — Encounter (HOSPITAL_COMMUNITY): Payer: Medicare Other

## 2017-02-01 ENCOUNTER — Ambulatory Visit (HOSPITAL_COMMUNITY)
Admission: RE | Admit: 2017-02-01 | Discharge: 2017-02-01 | Disposition: A | Discharge status: Home / Self Care | Payer: Medicare Other | Source: Ambulatory Visit | Attending: Hematology and Oncology | Admitting: Hematology and Oncology

## 2017-02-01 DIAGNOSIS — R928 Other abnormal and inconclusive findings on diagnostic imaging of breast: Secondary | ICD-10-CM | POA: Diagnosis not present

## 2017-02-01 DIAGNOSIS — L905 Scar conditions and fibrosis of skin: Secondary | ICD-10-CM | POA: Insufficient documentation

## 2017-02-01 DIAGNOSIS — D0511 Intraductal carcinoma in situ of right breast: Secondary | ICD-10-CM

## 2017-02-01 DIAGNOSIS — N6310 Unspecified lump in the right breast, unspecified quadrant: Secondary | ICD-10-CM | POA: Diagnosis present

## 2017-03-10 DIAGNOSIS — J302 Other seasonal allergic rhinitis: Secondary | ICD-10-CM | POA: Diagnosis not present

## 2017-03-10 DIAGNOSIS — Z1389 Encounter for screening for other disorder: Secondary | ICD-10-CM | POA: Diagnosis not present

## 2017-03-10 DIAGNOSIS — E782 Mixed hyperlipidemia: Secondary | ICD-10-CM | POA: Diagnosis not present

## 2017-03-10 DIAGNOSIS — Z6829 Body mass index (BMI) 29.0-29.9, adult: Secondary | ICD-10-CM | POA: Diagnosis not present

## 2017-03-10 DIAGNOSIS — E063 Autoimmune thyroiditis: Secondary | ICD-10-CM | POA: Diagnosis not present

## 2017-03-15 DIAGNOSIS — Z6829 Body mass index (BMI) 29.0-29.9, adult: Secondary | ICD-10-CM | POA: Diagnosis not present

## 2017-03-15 DIAGNOSIS — J019 Acute sinusitis, unspecified: Secondary | ICD-10-CM | POA: Diagnosis not present

## 2017-03-15 DIAGNOSIS — R509 Fever, unspecified: Secondary | ICD-10-CM | POA: Diagnosis not present

## 2017-03-15 DIAGNOSIS — R05 Cough: Secondary | ICD-10-CM | POA: Diagnosis not present

## 2017-03-15 DIAGNOSIS — J029 Acute pharyngitis, unspecified: Secondary | ICD-10-CM | POA: Diagnosis not present

## 2017-03-15 DIAGNOSIS — E663 Overweight: Secondary | ICD-10-CM | POA: Diagnosis not present

## 2017-04-04 DIAGNOSIS — Z6828 Body mass index (BMI) 28.0-28.9, adult: Secondary | ICD-10-CM | POA: Diagnosis not present

## 2017-04-04 DIAGNOSIS — Z23 Encounter for immunization: Secondary | ICD-10-CM | POA: Diagnosis not present

## 2017-04-04 DIAGNOSIS — Z79899 Other long term (current) drug therapy: Secondary | ICD-10-CM | POA: Diagnosis not present

## 2017-04-04 DIAGNOSIS — E663 Overweight: Secondary | ICD-10-CM | POA: Diagnosis not present

## 2017-04-04 DIAGNOSIS — F329 Major depressive disorder, single episode, unspecified: Secondary | ICD-10-CM | POA: Diagnosis not present

## 2017-04-04 DIAGNOSIS — K219 Gastro-esophageal reflux disease without esophagitis: Secondary | ICD-10-CM | POA: Diagnosis not present

## 2017-04-04 DIAGNOSIS — F419 Anxiety disorder, unspecified: Secondary | ICD-10-CM | POA: Diagnosis not present

## 2017-04-04 DIAGNOSIS — Z1389 Encounter for screening for other disorder: Secondary | ICD-10-CM | POA: Diagnosis not present

## 2017-04-08 DIAGNOSIS — E039 Hypothyroidism, unspecified: Secondary | ICD-10-CM | POA: Diagnosis not present

## 2017-04-08 DIAGNOSIS — R7309 Other abnormal glucose: Secondary | ICD-10-CM | POA: Diagnosis not present

## 2017-04-08 DIAGNOSIS — Z0000 Encounter for general adult medical examination without abnormal findings: Secondary | ICD-10-CM | POA: Diagnosis not present

## 2017-04-08 DIAGNOSIS — F419 Anxiety disorder, unspecified: Secondary | ICD-10-CM | POA: Diagnosis not present

## 2017-04-08 DIAGNOSIS — E782 Mixed hyperlipidemia: Secondary | ICD-10-CM | POA: Diagnosis not present

## 2017-05-16 DIAGNOSIS — N183 Chronic kidney disease, stage 3 (moderate): Secondary | ICD-10-CM | POA: Diagnosis not present

## 2017-05-16 DIAGNOSIS — I10 Essential (primary) hypertension: Secondary | ICD-10-CM | POA: Diagnosis not present

## 2017-05-16 DIAGNOSIS — R809 Proteinuria, unspecified: Secondary | ICD-10-CM | POA: Diagnosis not present

## 2017-05-16 DIAGNOSIS — E559 Vitamin D deficiency, unspecified: Secondary | ICD-10-CM | POA: Diagnosis not present

## 2017-05-16 DIAGNOSIS — D509 Iron deficiency anemia, unspecified: Secondary | ICD-10-CM | POA: Diagnosis not present

## 2017-05-16 DIAGNOSIS — Z79899 Other long term (current) drug therapy: Secondary | ICD-10-CM | POA: Diagnosis not present

## 2017-05-18 DIAGNOSIS — I10 Essential (primary) hypertension: Secondary | ICD-10-CM | POA: Diagnosis not present

## 2017-05-18 DIAGNOSIS — R809 Proteinuria, unspecified: Secondary | ICD-10-CM | POA: Diagnosis not present

## 2017-05-18 DIAGNOSIS — N250 Renal osteodystrophy: Secondary | ICD-10-CM | POA: Diagnosis not present

## 2017-05-18 DIAGNOSIS — N183 Chronic kidney disease, stage 3 (moderate): Secondary | ICD-10-CM | POA: Diagnosis not present

## 2017-07-04 DIAGNOSIS — Z6829 Body mass index (BMI) 29.0-29.9, adult: Secondary | ICD-10-CM | POA: Diagnosis not present

## 2017-07-04 DIAGNOSIS — J029 Acute pharyngitis, unspecified: Secondary | ICD-10-CM | POA: Diagnosis not present

## 2017-07-04 DIAGNOSIS — E663 Overweight: Secondary | ICD-10-CM | POA: Diagnosis not present

## 2017-07-04 DIAGNOSIS — J0100 Acute maxillary sinusitis, unspecified: Secondary | ICD-10-CM | POA: Diagnosis not present

## 2017-08-02 DIAGNOSIS — E663 Overweight: Secondary | ICD-10-CM | POA: Diagnosis not present

## 2017-08-02 DIAGNOSIS — F419 Anxiety disorder, unspecified: Secondary | ICD-10-CM | POA: Diagnosis not present

## 2017-08-02 DIAGNOSIS — Z1389 Encounter for screening for other disorder: Secondary | ICD-10-CM | POA: Diagnosis not present

## 2017-08-02 DIAGNOSIS — Z0001 Encounter for general adult medical examination with abnormal findings: Secondary | ICD-10-CM | POA: Diagnosis not present

## 2017-08-02 DIAGNOSIS — Z6828 Body mass index (BMI) 28.0-28.9, adult: Secondary | ICD-10-CM | POA: Diagnosis not present

## 2017-08-09 ENCOUNTER — Other Ambulatory Visit (HOSPITAL_COMMUNITY): Payer: Self-pay | Admitting: Family Medicine

## 2017-08-09 DIAGNOSIS — Z9889 Other specified postprocedural states: Secondary | ICD-10-CM

## 2017-08-09 DIAGNOSIS — Z1231 Encounter for screening mammogram for malignant neoplasm of breast: Secondary | ICD-10-CM

## 2017-08-16 ENCOUNTER — Ambulatory Visit (HOSPITAL_COMMUNITY)
Admission: RE | Admit: 2017-08-16 | Discharge: 2017-08-16 | Disposition: A | Discharge status: Home / Self Care | Payer: Medicare Other | Source: Ambulatory Visit | Attending: Family Medicine | Admitting: Family Medicine

## 2017-08-16 DIAGNOSIS — R928 Other abnormal and inconclusive findings on diagnostic imaging of breast: Secondary | ICD-10-CM | POA: Insufficient documentation

## 2017-08-16 DIAGNOSIS — Z9889 Other specified postprocedural states: Secondary | ICD-10-CM

## 2017-08-19 DIAGNOSIS — R944 Abnormal results of kidney function studies: Secondary | ICD-10-CM | POA: Diagnosis not present

## 2017-09-14 DIAGNOSIS — R809 Proteinuria, unspecified: Secondary | ICD-10-CM | POA: Diagnosis not present

## 2017-09-14 DIAGNOSIS — I10 Essential (primary) hypertension: Secondary | ICD-10-CM | POA: Diagnosis not present

## 2017-09-14 DIAGNOSIS — N183 Chronic kidney disease, stage 3 (moderate): Secondary | ICD-10-CM | POA: Diagnosis not present

## 2017-09-14 DIAGNOSIS — Z79899 Other long term (current) drug therapy: Secondary | ICD-10-CM | POA: Diagnosis not present

## 2017-09-14 DIAGNOSIS — D509 Iron deficiency anemia, unspecified: Secondary | ICD-10-CM | POA: Diagnosis not present

## 2017-09-14 DIAGNOSIS — E559 Vitamin D deficiency, unspecified: Secondary | ICD-10-CM | POA: Diagnosis not present

## 2017-09-21 DIAGNOSIS — I10 Essential (primary) hypertension: Secondary | ICD-10-CM | POA: Diagnosis not present

## 2017-09-21 DIAGNOSIS — D649 Anemia, unspecified: Secondary | ICD-10-CM | POA: Diagnosis not present

## 2017-09-21 DIAGNOSIS — N183 Chronic kidney disease, stage 3 (moderate): Secondary | ICD-10-CM | POA: Diagnosis not present

## 2017-09-21 DIAGNOSIS — R809 Proteinuria, unspecified: Secondary | ICD-10-CM | POA: Diagnosis not present

## 2017-10-02 IMAGING — RF DG ESOPHAGUS
14 series · 14 of 24 positions shown · non-contrast
Comparison: 06/06/2003

CLINICAL DATA: Dysphagia at the level of the cervical esophagus
with coughing episodes for approximately 6 months, history RIGHT
breast cancer post lumpectomy and radiation therapy

EXAM:
ESOPHOGRAM / BARIUM SWALLOW / BARIUM TABLET STUDY
TECHNIQUE: Combined double contrast and single contrast examination performed
using effervescent crystals, thick barium liquid, and thin barium
liquid. The patient was observed with fluoroscopy swallowing a 13 mm
barium sulphate tablet.
FLUOROSCOPY TIME:  Fluoroscopy Time:  1 minutes 30 seconds
Radiation Exposure Index (if provided by the fluoroscopic device):
28.3 mGy
Number of Acquired Spot Images: multiple fluoroscopic screen
captures

[Series 1: cp_standard · 0.18mm/px · 1 of 93 frames shown (1 of 14)]
[frame 1/93]
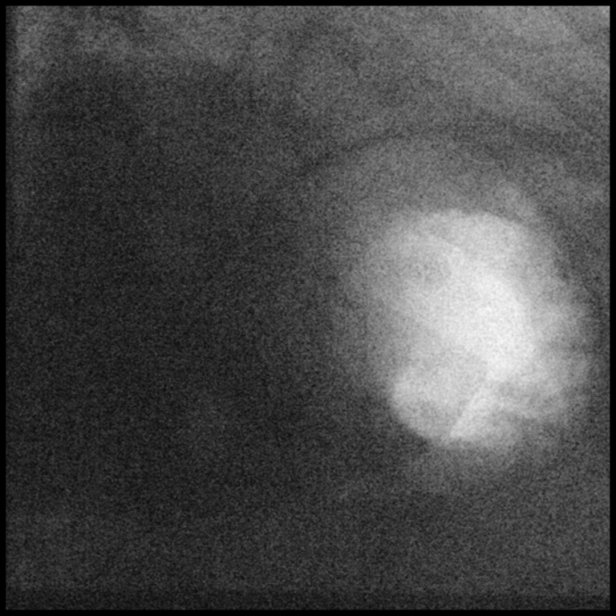

[Series 2: cp_standard · 0.19mm/px · 1 of 89 frames shown (2 of 14)]
[frame 14/89]
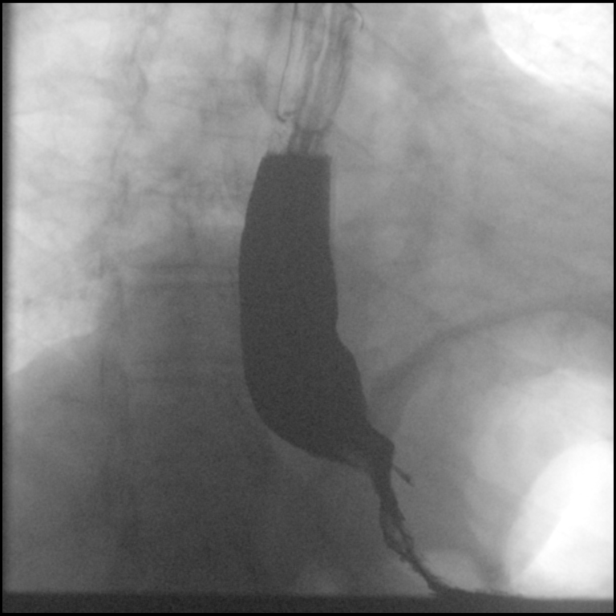

[Series 3: cp_standard · 0.18mm/px · 1 of 48 frames shown (3 of 14)]
[frame 25/48]
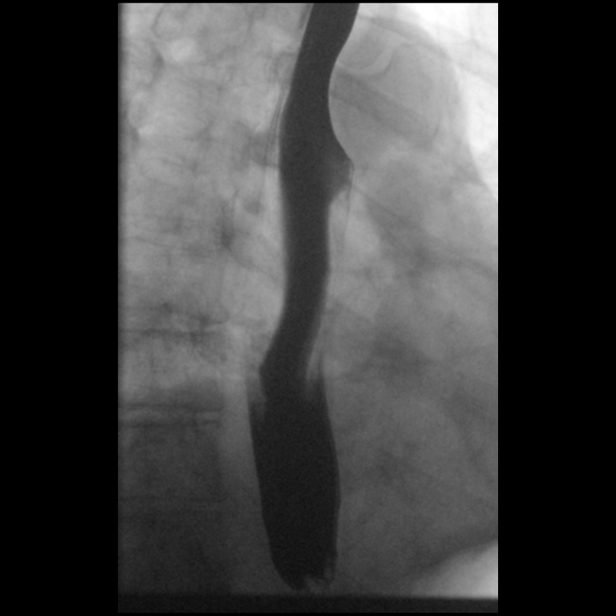

[Series 4: cp_standard · 0.18mm/px · 1 of 65 frames shown (4 of 14)]
[frame 56/65]
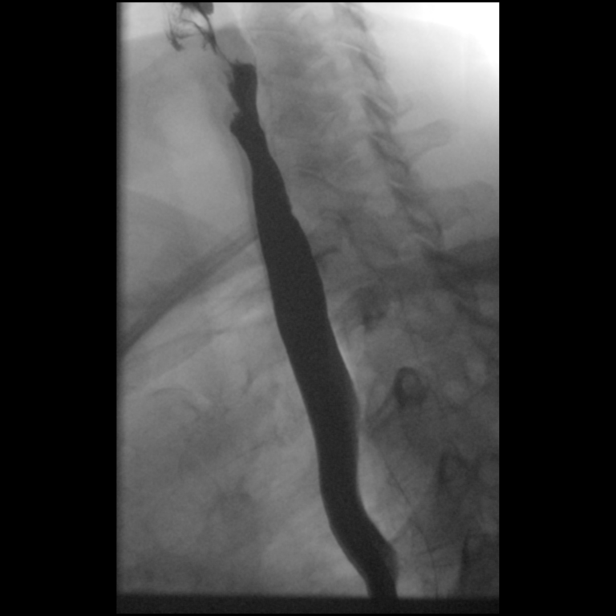

[Series 5: cp_standard · 0.18mm/px · 1 of 17 frames shown (5 of 14)]
[frame 3/17]
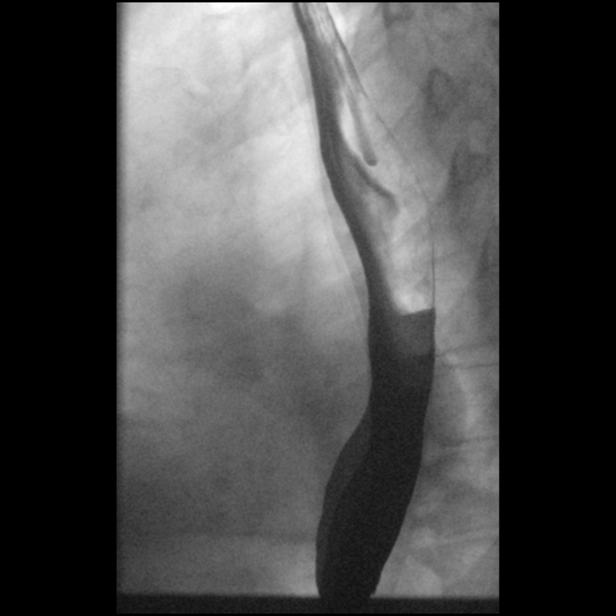

[Series 6: cp_standard · 0.18mm/px · 1 of 20 frames shown (6 of 14)]
[frame 16/20]
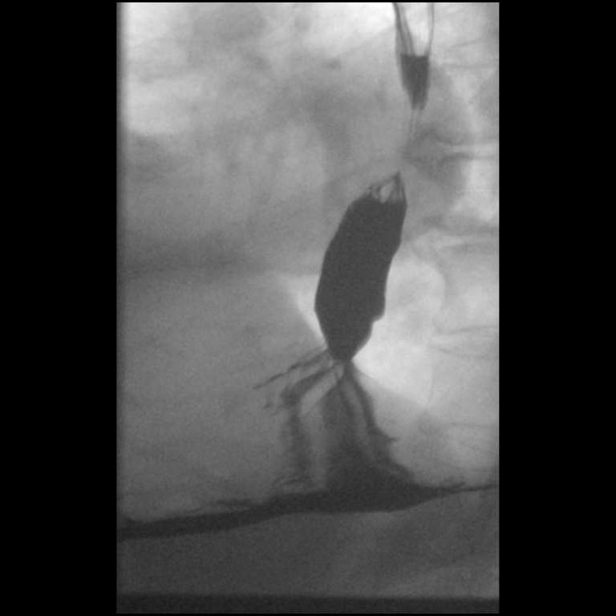

[Series 7: cp_standard · 0.19mm/px · 1 of 116 frames shown (7 of 14)]
[frame 99/116]
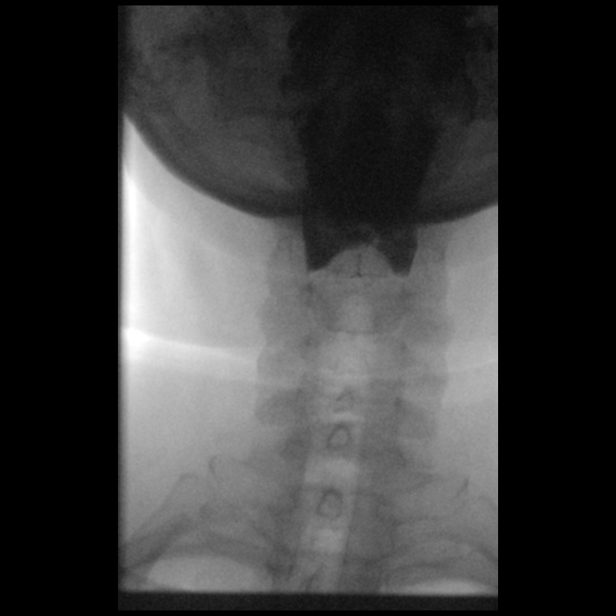

[Series 9: cp_standard · 0.18mm/px · 1 of 82 frames shown (8 of 14)]
[frame 13/82]
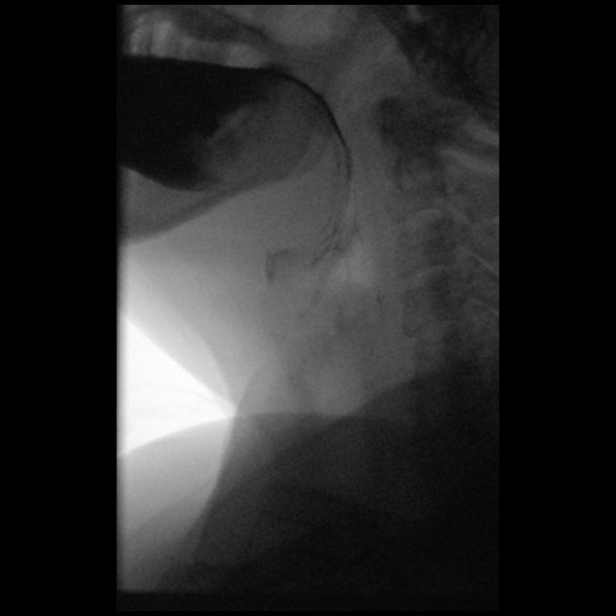

[Series 10: cp_standard · 0.18mm/px · 1 of 4 frames shown (9 of 14)]
[frame 3/4]
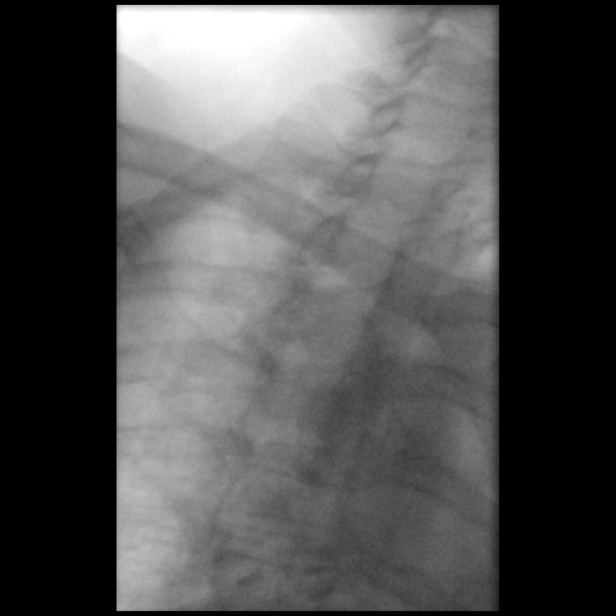

[Series 11: cp_standard · 0.18mm/px · 1 of 68 frames shown (10 of 14)]
[frame 58/68]
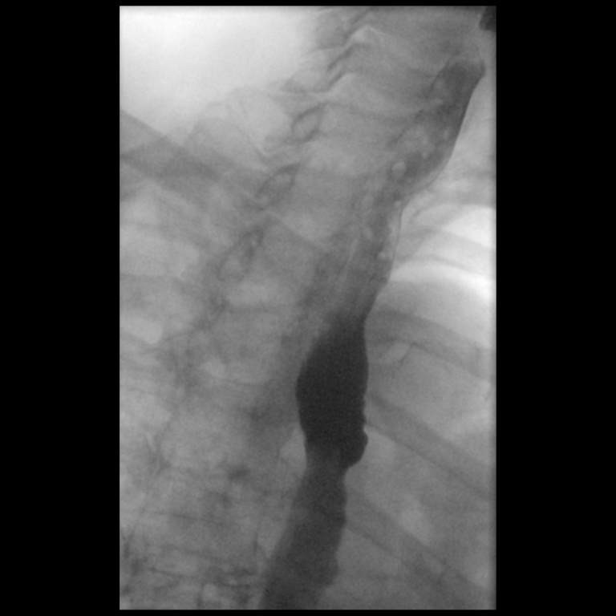

[Series 12: cp_standard · 0.18mm/px · 1 of 15 frames shown (11 of 14)]
[frame 13/15]
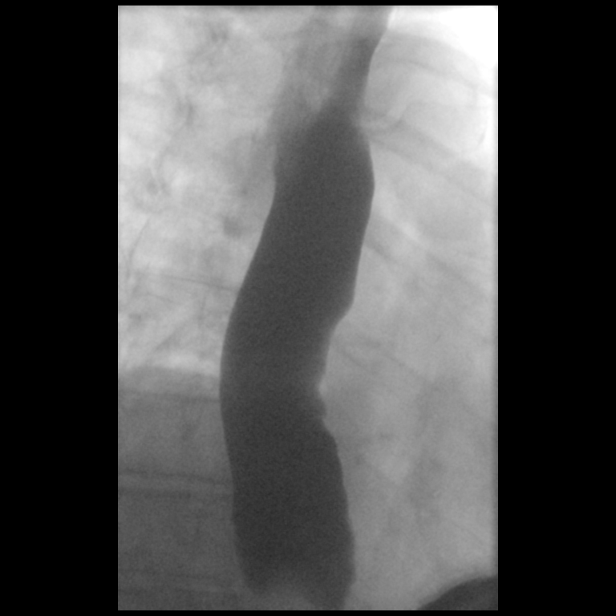

[Series 13: cp_standard · 0.18mm/px · 1 of 18 frames shown (12 of 14)]
[frame 16/18]
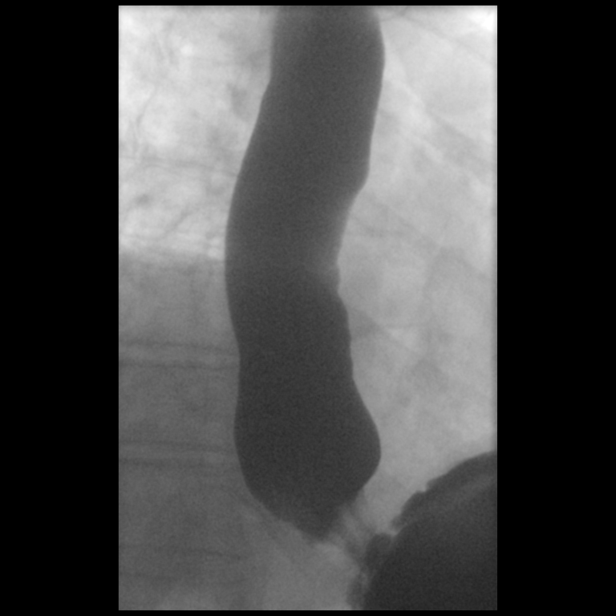

[Series 14: cp_standard · 0.18mm/px · 1 of 32 frames shown (13 of 14)]
[frame 17/32]
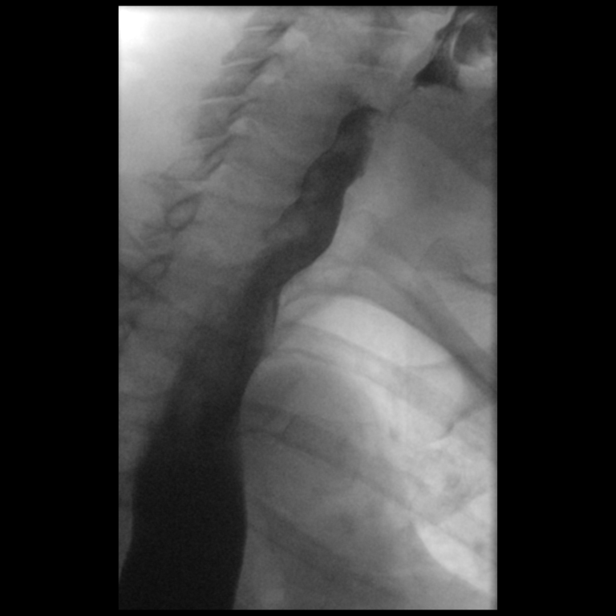

[Series 15: cp_standard · 0.18mm/px · 1 of 115 frames shown (14 of 14)]
[frame 98/115]
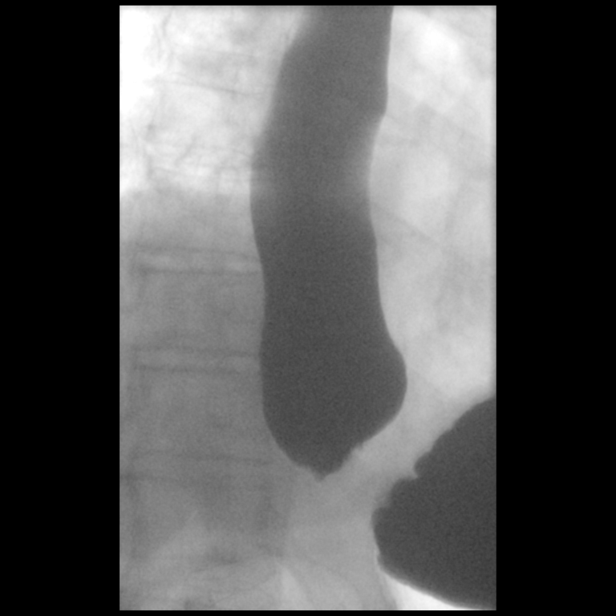

[14 of 24 positions shown; findings below may reference images not displayed]

FINDINGS: Esophageal distention: Mildly over distended without stricture or
mass

Filling defects:  None

12.5 mm barium tablet: Rapidly passed from oral cavity to stomach
without delay

Motility: Diffusely impaired with incomplete clearance by primary
peristaltic waves, scattered secondary and tertiary waves, and
prolonged thoracic retention of contrast.

Mucosa:  Smooth without irregularity or ulceration

Hypopharynx/cervical esophagus: Minimal laryngeal penetration. No
aspiration. Minimal vallecular residuals.

Hiatal hernia:  Absent

GE reflux:  Not visualize during exam

Other:  N/A
IMPRESSION: Mildly distended esophagus without evidence of mass or stricture.

Diffuse esophageal dysmotility.

## 2017-11-02 ENCOUNTER — Ambulatory Visit: Payer: Medicare Other | Admitting: Cardiology

## 2017-11-19 ENCOUNTER — Other Ambulatory Visit: Payer: Self-pay

## 2017-11-19 ENCOUNTER — Encounter (HOSPITAL_COMMUNITY): Payer: Self-pay | Admitting: Emergency Medicine

## 2017-11-19 ENCOUNTER — Emergency Department (HOSPITAL_COMMUNITY)
Admission: EM | Admit: 2017-11-19 | Discharge: 2017-11-19 | Disposition: A | Discharge status: Home / Self Care | Payer: Medicare Other | Attending: Emergency Medicine | Admitting: Emergency Medicine

## 2017-11-19 ENCOUNTER — Emergency Department (HOSPITAL_COMMUNITY): Payer: Medicare Other

## 2017-11-19 DIAGNOSIS — Y999 Unspecified external cause status: Secondary | ICD-10-CM | POA: Insufficient documentation

## 2017-11-19 DIAGNOSIS — J45909 Unspecified asthma, uncomplicated: Secondary | ICD-10-CM | POA: Diagnosis not present

## 2017-11-19 DIAGNOSIS — E039 Hypothyroidism, unspecified: Secondary | ICD-10-CM | POA: Insufficient documentation

## 2017-11-19 DIAGNOSIS — Z853 Personal history of malignant neoplasm of breast: Secondary | ICD-10-CM | POA: Diagnosis not present

## 2017-11-19 DIAGNOSIS — Y929 Unspecified place or not applicable: Secondary | ICD-10-CM | POA: Insufficient documentation

## 2017-11-19 DIAGNOSIS — W109XXA Fall (on) (from) unspecified stairs and steps, initial encounter: Secondary | ICD-10-CM | POA: Insufficient documentation

## 2017-11-19 DIAGNOSIS — Z79899 Other long term (current) drug therapy: Secondary | ICD-10-CM | POA: Insufficient documentation

## 2017-11-19 DIAGNOSIS — I10 Essential (primary) hypertension: Secondary | ICD-10-CM | POA: Diagnosis not present

## 2017-11-19 DIAGNOSIS — Y9389 Activity, other specified: Secondary | ICD-10-CM | POA: Diagnosis not present

## 2017-11-19 DIAGNOSIS — S99922A Unspecified injury of left foot, initial encounter: Secondary | ICD-10-CM | POA: Diagnosis present

## 2017-11-19 DIAGNOSIS — Z23 Encounter for immunization: Secondary | ICD-10-CM | POA: Insufficient documentation

## 2017-11-19 DIAGNOSIS — S92422A Displaced fracture of distal phalanx of left great toe, initial encounter for closed fracture: Secondary | ICD-10-CM | POA: Diagnosis not present

## 2017-11-19 MED ORDER — TRAMADOL HCL 50 MG PO TABS: 50.0000 mg | ORAL_TABLET | Freq: Four times a day (QID) | ORAL | 0 refills | Status: DC | PRN

## 2017-11-19 MED ORDER — TRAMADOL HCL 50 MG PO TABS
50.0000 mg | ORAL_TABLET | Freq: Once | ORAL | Status: AC
  Administered 2017-11-19: 50 mg via ORAL
  Filled 2017-11-19: qty 1

## 2017-11-19 MED ORDER — TETANUS-DIPHTH-ACELL PERTUSSIS 5-2.5-18.5 LF-MCG/0.5 IM SUSP
0.5000 mL | Freq: Once | INTRAMUSCULAR | Status: AC
  Administered 2017-11-19: 0.5 mL via INTRAMUSCULAR
  Filled 2017-11-19: qty 0.5

## 2017-11-19 MED ORDER — CEPHALEXIN 500 MG PO CAPS: 500.0000 mg | ORAL_CAPSULE | Freq: Three times a day (TID) | ORAL | 0 refills | Status: DC

## 2017-11-19 NOTE — ED Notes (Signed)
From Rad 

## 2017-11-19 NOTE — Discharge Instructions (Addendum)
Elevate your foot when possible.  You may clean the wound with mild soap and water.  Keep it bandaged and buddy taped.  Call the orthopedic provider listed to arrange a follow-up appointment for next week.

## 2017-11-19 NOTE — ED Notes (Signed)
Going down stairs to do laundry and fell at last 2 steps landing on her bottom Also landing on her foot wrong- now with "busted" great toenail-  And pain to the toe

## 2017-11-19 NOTE — ED Provider Notes (Signed)
[facility] EMERGENCY DEPARTMENT Provider Note   CSN: [id number] Arrival date & time: 11/19/17  1320     History   Chief Complaint Chief Complaint  Patient presents with  . Foot Injury    HPI Jane Wilkerson is a 77 y.o. female.  HPI   Jane Wilkerson is a 77 y.o. female who presents to the Emergency Department complaining of pain and swelling of her left great toe.  She states she fell down 2 steps earlier today and she is unsure how the toe injury occurred.  She states that she fell on her buttocks.  She complains of immediate pain and bleeding of her toe.  She describes the pain as a throbbing sensation.  Symptoms are associated with bleeding of the distal toe.  She denies head injury, LOC, dizziness, or symptoms prior to the fall.  Last Td is unknown.  Past Medical History:  Diagnosis Date  . Anxiety 07/07/2011   takes Ativan every 4hrs  . Arthritis    back   . Asthma    uses inhaler prn  . Breast cancer ([place])    right - local excision, chemotheraphy  . Cataract    right eye  . Chronic back pain   . Depression    has appt to discuss depression 07/26/2011  . Diverticulosis   . Dizziness    occasionally  . Gastric ulcer   . GERD (gastroesophageal reflux disease)    takes Nexium daily  . Hemorrhoids   . History of nuclear stress test 11/2010   lexiscan; no evidence of inducible ischemia; normal pattern of perfusion   . History of shingles 2011  . Hx of colonic polyps   . Hyperlipidemia    takes Zocor daily  . Hypertension    takes Bystolic and Losartan daily  . Hypothyroidism    takes Synthroid daily  . Nausea alone    onset last Wednesday  08/04/11  . Nocturia   . Osteoporosis   . PONV (postoperative nausea and vomiting)   . PVC's (premature ventricular contractions)   . Shortness of breath    with exertion  . Urinary frequency     Patient Active Problem List   Diagnosis Date Noted  . Dilatation of thoracic aorta ([place]) 10/04/2016  . Hypothyroidism  10/04/2016  . Labile hypertension 10/04/2016  . Chest pressure 08/15/2015  . DOE (dyspnea on exertion) 08/15/2015  . Dyslipidemia 05/08/2013  . Palpitations 05/08/2013  . Anxiety 07/07/2011  . Ductal carcinoma in situ (DCIS) of right breast 06/16/2011  . Malignant neoplasm of upper-outer quadrant of right female breast ([place]) 06/16/2011    Past Surgical History:  Procedure Laterality Date  . ABDOMINAL HYSTERECTOMY  2001  . BREAST LUMPECTOMY Right 07/02/11   right breast and SNL  . BREAST SURGERY Left 1972   left lumpectomy  . BREAST SURGERY Right 08/11/11   margins on rt breast  . CATARACT EXTRACTION W/PHACO Right 08/31/2016   Procedure: CATARACT EXTRACTION PHACO AND INTRAOCULAR LENS PLACEMENT ([place]);  Surgeon: Rutherford Guys, MD;  Location: [facility];  Service: Ophthalmology;  Laterality: Right;  CDE: 8.46  . CATARACT EXTRACTION W/PHACO Left 09/14/2016   Procedure: CATARACT EXTRACTION PHACO AND INTRAOCULAR LENS PLACEMENT ([facility]);  Surgeon: Rutherford Guys, MD;  Location: [facility];  Service: Ophthalmology;  Laterality: Left;  CDE: 9.32  . colonscopy    . HEMORRHOID SURGERY    . TRANSTHORACIC ECHOCARDIOGRAM  2009   normal LV & RV systolic function; mild mitral annular calcif &  mild MR; trace TR     OB History   None      Home Medications    Prior to Admission medications   Medication Sig Start Date End Date Taking? Authorizing Provider  albuterol (PROVENTIL HFA;VENTOLIN HFA) 108 (90 BASE) MCG/ACT inhaler Inhale 2 puffs into the lungs every 6 (six) hours as needed for wheezing or shortness of breath.     [provider]  alendronate (FOSAMAX) 70 MG tablet Take 1 tablet by mouth once a week. Wednesday 08/06/15   [provider]  Calcium Carbonate (CALTRATE 600 PO) Take 600 mg by mouth daily.     [provider]  citalopram (CELEXA) 20 MG tablet Take 20 mg by mouth daily.    [provider]  esomeprazole (NEXIUM) 40 MG capsule Take 40 mg by mouth daily.      [provider]  fish oil-omega-3 fatty acids 1000 MG capsule Take 1 g by mouth daily.     [provider]  hydrocortisone cream 0.5 % Apply 1 application topically 2 (two) times daily as needed for itching. For hemorrhoid pain    [provider]  levothyroxine (SYNTHROID, LEVOTHROID) 50 MCG tablet Take 50 mcg by mouth daily.    [provider]  LORazepam (ATIVAN) 1 MG tablet Take 1 mg by mouth at bedtime. For anxiety    [provider]  losartan (COZAAR) 100 MG tablet Take 100 mg by mouth daily.     [provider]  nebivolol (BYSTOLIC) 10 MG tablet Take 1 tablet (10 mg total) by mouth daily. 02/11/14   Hilty, Nadean Corwin, MD  simvastatin (ZOCOR) 40 MG tablet Take 40 mg by mouth at bedtime.      [provider]    Family History Family History  Problem Relation Age of Onset  . Colon cancer Mother        also stroke  . Pancreatic cancer Father        also heart disease  . Diabetes Sister   . Hypertension Sister   . Anesthesia problems Neg Hx   . Hypotension Neg Hx   . Malignant hyperthermia Neg Hx   . Pseudochol deficiency Neg Hx     Social History Social History   Tobacco Use  . Smoking status: Never Smoker  . Smokeless tobacco: Never Used  Substance Use Topics  . Alcohol use: No  . Drug use: No     Allergies   Hydrocodone; Oxycodone hcl; Ziac [bisoprolol-hydrochlorothiazide]; and Penicillins   Review of Systems Review of Systems  Constitutional: Negative for chills and fever.  Gastrointestinal: Negative for nausea and vomiting.  Musculoskeletal: Positive for arthralgias (Left great toe pain and swelling) and joint swelling.  Skin: Positive for wound. Negative for color change.  Neurological: Negative for weakness and numbness.  All other systems reviewed and are negative.    Physical Exam Updated Vital Signs BP 135/62 (BP Location: Right Arm)   Pulse 88   Temp 97.9 F (36.6 C) (Oral)   Resp 18    Ht 5\' 2"  (1.575 m)   Wt 72.6 kg (160 lb)   SpO2 97%   BMI 29.26 kg/m   Physical Exam  Constitutional: She is oriented to person, place, and time. She appears well-developed and well-nourished. No distress.  HENT:  Mouth/Throat: Oropharynx is clear and moist.  Cardiovascular: Normal rate, regular rhythm and intact distal pulses.  Pulmonary/Chest: Effort normal and breath sounds normal. No respiratory distress.  Musculoskeletal: She exhibits edema  and tenderness. She exhibits no deformity.  Tenderness to palpation of the distal left great toe.  Small amount of bruising to the proximal nail.  Nail is intact.  There is a small tear of the skin to the medial and lateral epionychium.  Bleeding controlled.  No bony deformity.  No proximal tenderness.  Neurological: She is alert and oriented to person, place, and time. No cranial nerve deficit or sensory deficit.  Skin: Skin is warm. Capillary refill takes less than 2 seconds.  Nursing note and vitals reviewed.    ED Treatments / Results  Labs (all labs ordered are listed, but only abnormal results are displayed) Labs Reviewed - No data to display  EKG None  Radiology Dg Toe Great Left  Result Date: 11/19/2017 CLINICAL DATA:  Tripped and fell down to stairs injuring left great toe. EXAM: LEFT GREAT TOE COMPARISON:  None. FINDINGS: There is a minimally displaced complete transverse fracture of at the base of the base of the first distal phalanx with additional fracture site over the distal aspect of the first distal phalanx with minimal displacement. Remainder the exam is unremarkable. IMPRESSION: Fractures involving the base and distal aspect of the first distal phalanx. Electronically Signed   By: Marin Olp M.D.   On: 11/19/2017 14:37    Procedures Procedures (including critical care time)  Medications Ordered in ED Medications  Tdap (BOOSTRIX) injection 0.5 mL (has no administration in time range)  traMADol (ULTRAM) tablet 50 mg  (has no administration in time range)     Initial Impression / Assessment and Plan / ED Course  I have reviewed the triage vital signs and the nursing notes.  Pertinent labs & imaging results that were available during my care of the patient were reviewed by me and considered in my medical decision making (see chart for details).     TD updated.  Remains neurovascularly intact.  Small amount of bruising at the proximal end of the distal joint.  No definite subungual hematoma.  Skin tears do not involve into the joint.  Patient seen by Dr. Lacinda Axon and care plan discussed.  Wound was cleaned and bandaged.  Toes buddy taped.  Patient placed in postop boot, prescription for antibiotics she agrees to close orthopedic follow-up.  Return precautions discussed.  Final Clinical Impressions(s) / ED Diagnoses   Final diagnoses:  Closed displaced fracture of distal phalanx of left great toe, initial encounter    ED Discharge Orders    None       Kem Parkinson, PA-C 11/19/17 1552    Nat Christen, MD 11/20/17 [phone]

## 2017-11-19 NOTE — ED Triage Notes (Addendum)
Pt tripped and fell down 2 steps, states she landed on her buttocks and did not hit her head c/o pain to lt great toe.

## 2017-11-21 ENCOUNTER — Encounter: Payer: Self-pay | Admitting: Internal Medicine

## 2017-11-21 ENCOUNTER — Ambulatory Visit (INDEPENDENT_AMBULATORY_CARE_PROVIDER_SITE_OTHER): Payer: Medicare Other | Admitting: Internal Medicine

## 2017-11-21 VITALS — BP 123/80 | HR 76 | Ht 62.0 in | Wt 160.8 lb

## 2017-11-21 DIAGNOSIS — R0989 Other specified symptoms and signs involving the circulatory and respiratory systems: Secondary | ICD-10-CM

## 2017-11-21 DIAGNOSIS — I77810 Thoracic aortic ectasia: Secondary | ICD-10-CM | POA: Diagnosis not present

## 2017-11-21 DIAGNOSIS — E785 Hyperlipidemia, unspecified: Secondary | ICD-10-CM

## 2017-11-21 NOTE — Patient Instructions (Addendum)
Dr. Debara Pickett recommends that you try hydrocortisone over the counter  Your physician wants you to follow-up in: ONE YEAR with Dr. Debara Pickett. You will receive a reminder letter in the mail two months in advance. If you don't receive a letter, please call our office to schedule the follow-up appointment.

## 2017-11-21 NOTE — Progress Notes (Signed)
OFFICE NOTE  Chief Complaint:  Routine follow-up  Primary Care Physician: Redmond School, MD  HPI:  Jane Wilkerson  is a 77 year old female with a history of PVCs and bigeminy but had a stress test in May which was negative and that seemed to have resolved. She has had some reflux symptoms as well which improved. Unfortunately over the past year I understand she was diagnosed with breast cancer which is DCIS and underwent local excision and is on chemotherapy. She seems to be doing well and is about a year out from that, and fortunately that was caught early on mammogram. From a cardiac standpoint she denies any chest pain, worsening shortness of breath, palpitations, presyncope or syncopal symptoms.  Recently she had been told that she had some abnormalities with her renal function. She was referred to a nephrologist who she saw a couple times and underwent a 24-hour urine in additional blood work. She was subsequently released. She's not had any recent lipid profile testing not aware of. She continues to have some palpitations although they're not quite as bothersome.  She is seeing Dr. Hinda Lenis, who feels that her renal function is stable. He recently increased her by Bystolic to 20 mg daily.  Jane Wilkerson returns today for follow-up. Recently she's been a little more anxious and she's noted some lability in her blood pressures. At times her blood pressure was as low as 80/50 but can run high. She's also felt some fatigue, some aching or tightness in her chest, particularly with sitting up at night and some progressive increase in shortness of breath. Blood pressure today was elevated in the office 162/98. She's recently had some medication timing adjustments by her primary care provider.  I saw Jane Wilkerson back today in the office. Overall she is feeling so slightly better. She does have some labile blood pressures. She underwent nuclear stress testing which was negative for ischemia and  showed normal LV function. Her echo also showed normal LV function. There was borderline enlarged aortic root at 3.7 cm.  10/04/2016  Jane Wilkerson returns for follow-up. She reports recently she's been having some worsening cough and upper airway wheezing. She recently had bilateral cataract surgery which was uneventful. She thinks her symptoms are likely related to GERD and has a follow-up appointment with Dr. Laural Golden in GI. Blood pressure is 142/90 today. She reports some lability in her blood pressures. Namely, she gets some lower blood pressures at night. Recent she was taken off of HCTZ as of low sodium. This is not been recently reassessed. She also has borderline dilatation of the ascending aorta 3.7 cm seen on echo last year. LVEF at the time was 50-55%.  11/21/2017  Jane Wilkerson is seen today in follow-up.  Overall she seems to be doing well.  Unfortunately she had a recent fall and broke her left great toe.  She is been on some antibiotics and pain medicine is developed a rash which is pruritic.  It may be related to that however she thinks it could be related to being outside in the sun.  She has good control of her blood pressure today which is 123/80.  And please report the prior shadow on the right breast which is seen on CT scan is most likely old scar tissue from lumpectomy.  EKG shows sinus rhythm at 76 today.  PMHx:  Past Medical History:  Diagnosis Date  . Anxiety 07/07/2011   takes Ativan every 4hrs  . Arthritis  back   . Asthma    uses inhaler prn  . Breast cancer ([place])    right - local excision, chemotheraphy  . Cataract    right eye  . Chronic back pain   . Depression    has appt to discuss depression 07/26/2011  . Diverticulosis   . Dizziness    occasionally  . Gastric ulcer   . GERD (gastroesophageal reflux disease)    takes Nexium daily  . Hemorrhoids   . History of nuclear stress test 11/2010   lexiscan; no evidence of inducible ischemia; normal pattern of  perfusion   . History of shingles 2011  . Hx of colonic polyps   . Hyperlipidemia    takes Zocor daily  . Hypertension    takes Bystolic and Losartan daily  . Hypothyroidism    takes Synthroid daily  . Nausea alone    onset last Wednesday  08/04/11  . Nocturia   . Osteoporosis   . PONV (postoperative nausea and vomiting)   . PVC's (premature ventricular contractions)   . Shortness of breath    with exertion  . Urinary frequency     Past Surgical History:  Procedure Laterality Date  . ABDOMINAL HYSTERECTOMY  2001  . BREAST LUMPECTOMY Right 07/02/11   right breast and SNL  . BREAST SURGERY Left 1972   left lumpectomy  . BREAST SURGERY Right 08/11/11   margins on rt breast  . CATARACT EXTRACTION W/PHACO Right 08/31/2016   Procedure: CATARACT EXTRACTION PHACO AND INTRAOCULAR LENS PLACEMENT ([place]);  Surgeon: Rutherford Guys, MD;  Location: [facility];  Service: Ophthalmology;  Laterality: Right;  CDE: 8.46  . CATARACT EXTRACTION W/PHACO Left 09/14/2016   Procedure: CATARACT EXTRACTION PHACO AND INTRAOCULAR LENS PLACEMENT ([facility]);  Surgeon: Rutherford Guys, MD;  Location: [facility];  Service: Ophthalmology;  Laterality: Left;  CDE: 9.32  . colonscopy    . HEMORRHOID SURGERY    . TRANSTHORACIC ECHOCARDIOGRAM  2009   normal LV & RV systolic function; mild mitral annular calcif & mild MR; trace TR    FAMHx:  Family History  Problem Relation Age of Onset  . Colon cancer Mother        also stroke  . Pancreatic cancer Father        also heart disease  . Diabetes Sister   . Hypertension Sister   . Anesthesia problems Neg Hx   . Hypotension Neg Hx   . Malignant hyperthermia Neg Hx   . Pseudochol deficiency Neg Hx     SOCHx:   reports that she has never smoked. She has never used smokeless tobacco. She reports that she does not drink alcohol or use drugs.  ALLERGIES:  Allergies  Allergen Reactions  . Hydrocodone Other (See Comments)    Reaction unknown  . Oxycodone Hcl Itching  . Ziac  [Bisoprolol-Hydrochlorothiazide] Other (See Comments)    Reaction unknown  . Penicillins Rash    Has patient had a PCN reaction causing immediate rash, facial/tongue/throat swelling, SOB or lightheadedness with hypotension: unknown Has patient had a PCN reaction causing severe rash involving mucus membranes or skin necrosis: unknown Has patient had a PCN reaction that required hospitalization No Has patient had a PCN reaction occurring within the last 10 years: No If all of the above answers are "NO", then may proceed with Cephalosporin use.     ROS: Pertinent items noted in HPI and remainder of comprehensive ROS otherwise negative.  HOME MEDS: Current Outpatient Medications  Medication Sig Dispense  Refill  . albuterol (PROVENTIL HFA;VENTOLIN HFA) 108 (90 BASE) MCG/ACT inhaler Inhale 2 puffs into the lungs every 6 (six) hours as needed for wheezing or shortness of breath.     Marland Kitchen alendronate (FOSAMAX) 70 MG tablet Take 1 tablet by mouth once a week. Wednesday    . Calcium Carbonate (CALTRATE 600 PO) Take 600 mg by mouth daily.     . cephALEXin (KEFLEX) 500 MG capsule Take 1 capsule (500 mg total) by mouth 3 (three) times daily. 21 capsule 0  . citalopram (CELEXA) 20 MG tablet Take 20 mg by mouth daily.    Marland Kitchen esomeprazole (NEXIUM) 40 MG capsule Take 40 mg by mouth daily.     . fish oil-omega-3 fatty acids 1000 MG capsule Take 1 g by mouth daily.     . hydrocortisone cream 0.5 % Apply 1 application topically 2 (two) times daily as needed for itching. For hemorrhoid pain    . levothyroxine (SYNTHROID, LEVOTHROID) 50 MCG tablet Take 50 mcg by mouth daily.    Marland Kitchen LORazepam (ATIVAN) 1 MG tablet Take 1 mg by mouth at bedtime. For anxiety    . losartan (COZAAR) 100 MG tablet Take 100 mg by mouth daily.     . nebivolol (BYSTOLIC) 10 MG tablet Take 1 tablet (10 mg total) by mouth daily. 30 tablet 6  . simvastatin (ZOCOR) 40 MG tablet Take 40 mg by mouth at bedtime.      . traMADol (ULTRAM) 50 MG  tablet Take 1 tablet (50 mg total) by mouth every 6 (six) hours as needed. 15 tablet 0   No current facility-administered medications for this visit.     LABS/IMAGING: No results found for this or any previous visit (from the past 48 hour(s)). Dg Toe Great Left  Result Date: 11/19/2017 CLINICAL DATA:  Tripped and fell down to stairs injuring left great toe. EXAM: LEFT GREAT TOE COMPARISON:  None. FINDINGS: There is a minimally displaced complete transverse fracture of at the base of the base of the first distal phalanx with additional fracture site over the distal aspect of the first distal phalanx with minimal displacement. Remainder the exam is unremarkable. IMPRESSION: Fractures involving the base and distal aspect of the first distal phalanx. Electronically Signed   By: Marin Olp M.D.   On: 11/19/2017 14:37    VITALS: BP 123/80   Pulse 76   Ht 5\' 2"  (1.575 m)   Wt 160 lb 12.8 oz (72.9 kg)   BMI 29.41 kg/m   EXAM: General appearance: alert and no distress Neck: no carotid bruit and no JVD Lungs: clear to auscultation bilaterally Heart: regular rate and rhythm Abdomen: soft, non-tender; bowel sounds normal; no masses,  no organomegaly Extremities: extremities normal, atraumatic, no cyanosis or edema Pulses: 2+ and symmetric Skin: Skin color, texture, turgor normal. No rashes or lesions Neurologic: Grossly normal Psych: Pleasant, mildly anxious  EKG: Normal sinus rhythm at 76 -only reviewed  ASSESSMENT: 1. Chest pressure/dyspnea on exertion - low risk stress test and echocardiogram with normal LV function (08/2015) 2. Top normal ascending aorta 3.7 cm 3. Palpitations-generally well controlled 4. Dyslipidemia 5. Recent history of breast cancer 6. Abnormal renal function by report 7. Labile hypertension 8. Anxiety  PLAN: 1.   Ms. Melick has no new cardiac symptoms that are concerning today.  She had a top normal ascending aorta at 3.7 cm have a repeat CT scan showed it  at 3.6 cm.  I do not think this represents any aneurysm.  We could probably follow this up less frequently.  She is free from breast cancer which is good news.  Blood pressures been labile at times she has low blood pressure.  We talked about possibly readjusting her medicines and taking the losartan in the morning and the Bystolic at night.  She could consider checking her blood pressure prior to taking her evening medicine and if she is noted to have a low blood pressure (when it typically is low) then she could consider not taking the evening blood pressure medicine.  Follow-up with me annually or sooner as necessary.  Pixie Casino, MD, [facility], [place] Director of the [facility]  [facility] Diplomate of the American Board of Clinical Lipidology Attending Cardiologist  Direct Dial: [phone]  Fax: [phone]  Website:  [email or web address] 11/21/2017, 1:57 PM

## 2017-11-22 ENCOUNTER — Ambulatory Visit (INDEPENDENT_AMBULATORY_CARE_PROVIDER_SITE_OTHER): Payer: Medicare Other | Admitting: Orthopedic Surgery

## 2017-11-22 ENCOUNTER — Encounter: Payer: Self-pay | Admitting: Orthopedic Surgery

## 2017-11-22 VITALS — BP 126/79 | HR 74 | Ht 62.0 in | Wt 156.0 lb

## 2017-11-22 DIAGNOSIS — S92405A Nondisplaced unspecified fracture of left great toe, initial encounter for closed fracture: Secondary | ICD-10-CM | POA: Diagnosis not present

## 2017-11-22 NOTE — Patient Instructions (Signed)
Dressing x 1 week Post op shoe 2-4 weeks Return 4 weeks for xrays

## 2017-11-22 NOTE — Progress Notes (Signed)
NEW PATIENT OFFICE VISIT   Chief Complaint  Patient presents with  . Toe Injury    fracture left great toe fell down steps 11/19/17     MEDICAL DECISION SECTION  xrays ordered? no  My independent reading of xrays: The x-ray that was taken at the ER visit shows a comminuted fracture of the great toe at the distal phalanx of the left foot   Encounter Diagnosis  Name Primary?  . Nondisplaced unspecified fracture of left great toe, initial encounter for closed fracture Yes     PLAN:  Weightbearing as tolerated in the postop shoe  Finish Keflex  Tramadol  No work notes needed  Weightbearing status as tolerated minimal dressing as needed  Follow-up in 4 weeks x-ray  No orders of the defined types were placed in this encounter.  Injection? no MRI/CT/? no      Chief Complaint  Patient presents with  . Toe Injury    fracture left great toe fell down steps 11/19/17    77 year old female fell down some stairs on the 18th presents with a 3-day history of pain in the left great toe.  Pain is mild quality is dull timing is constant modifying factors include weightbearing pain     Review of Systems  All other systems reviewed and are negative.    Past Medical History:  Diagnosis Date  . Anxiety 07/07/2011   takes Ativan every 4hrs  . Arthritis    back   . Asthma    uses inhaler prn  . Breast cancer ([place])    right - local excision, chemotheraphy  . Cataract    right eye  . Chronic back pain   . Depression    has appt to discuss depression 07/26/2011  . Diverticulosis   . Dizziness    occasionally  . Gastric ulcer   . GERD (gastroesophageal reflux disease)    takes Nexium daily  . Hemorrhoids   . History of nuclear stress test 11/2010   lexiscan; no evidence of inducible ischemia; normal pattern of perfusion   . History of shingles 2011  . Hx of colonic polyps   . Hyperlipidemia    takes Zocor daily  . Hypertension    takes Bystolic and Losartan  daily  . Hypothyroidism    takes Synthroid daily  . Nausea alone    onset last Wednesday  08/04/11  . Nocturia   . Osteoporosis   . PONV (postoperative nausea and vomiting)   . PVC's (premature ventricular contractions)   . Shortness of breath    with exertion  . Urinary frequency     Past Surgical History:  Procedure Laterality Date  . ABDOMINAL HYSTERECTOMY  2001  . BREAST LUMPECTOMY Right 07/02/11   right breast and SNL  . BREAST SURGERY Left 1972   left lumpectomy  . BREAST SURGERY Right 08/11/11   margins on rt breast  . CATARACT EXTRACTION W/PHACO Right 08/31/2016   Procedure: CATARACT EXTRACTION PHACO AND INTRAOCULAR LENS PLACEMENT ([place]);  Surgeon: Rutherford Guys, MD;  Location: [facility];  Service: Ophthalmology;  Laterality: Right;  CDE: 8.46  . CATARACT EXTRACTION W/PHACO Left 09/14/2016   Procedure: CATARACT EXTRACTION PHACO AND INTRAOCULAR LENS PLACEMENT ([facility]);  Surgeon: Rutherford Guys, MD;  Location: [facility];  Service: Ophthalmology;  Laterality: Left;  CDE: 9.32  . colonscopy    . HEMORRHOID SURGERY    . TRANSTHORACIC ECHOCARDIOGRAM  2009   normal LV & RV systolic function; mild mitral annular calcif & mild  MR; trace TR    Family History  Problem Relation Age of Onset  . Colon cancer Mother        also stroke  . Pancreatic cancer Father        also heart disease  . Diabetes Sister   . Hypertension Sister   . Anesthesia problems Neg Hx   . Hypotension Neg Hx   . Malignant hyperthermia Neg Hx   . Pseudochol deficiency Neg Hx    Social History   Tobacco Use  . Smoking status: Never Smoker  . Smokeless tobacco: Never Used  Substance Use Topics  . Alcohol use: No  . Drug use: No    Allergies  Allergen Reactions  . Hydrocodone Other (See Comments)    Reaction unknown  . Oxycodone Hcl Itching  . Ziac [Bisoprolol-Hydrochlorothiazide] Other (See Comments)    Reaction unknown  . Penicillins Rash    Has patient had a PCN reaction causing immediate rash,  facial/tongue/throat swelling, SOB or lightheadedness with hypotension: unknown Has patient had a PCN reaction causing severe rash involving mucus membranes or skin necrosis: unknown Has patient had a PCN reaction that required hospitalization No Has patient had a PCN reaction occurring within the last 10 years: No If all of the above answers are "NO", then may proceed with Cephalosporin use.      Current Meds  Medication Sig  . albuterol (PROVENTIL HFA;VENTOLIN HFA) 108 (90 BASE) MCG/ACT inhaler Inhale 2 puffs into the lungs every 6 (six) hours as needed for wheezing or shortness of breath.   Marland Kitchen alendronate (FOSAMAX) 70 MG tablet Take 1 tablet by mouth once a week. Wednesday  . Calcium Carbonate (CALTRATE 600 PO) Take 600 mg by mouth daily.   . cephALEXin (KEFLEX) 500 MG capsule Take 1 capsule (500 mg total) by mouth 3 (three) times daily.  . citalopram (CELEXA) 20 MG tablet Take 20 mg by mouth daily.  Marland Kitchen esomeprazole (NEXIUM) 40 MG capsule Take 40 mg by mouth daily.   . fish oil-omega-3 fatty acids 1000 MG capsule Take 1 g by mouth daily.   . hydrocortisone cream 0.5 % Apply 1 application topically 2 (two) times daily as needed for itching. For hemorrhoid pain  . levothyroxine (SYNTHROID, LEVOTHROID) 50 MCG tablet Take 50 mcg by mouth daily.  Marland Kitchen LORazepam (ATIVAN) 1 MG tablet Take 1 mg by mouth at bedtime. For anxiety  . losartan (COZAAR) 100 MG tablet Take 100 mg by mouth daily.   . nebivolol (BYSTOLIC) 10 MG tablet Take 1 tablet (10 mg total) by mouth daily.  . simvastatin (ZOCOR) 40 MG tablet Take 40 mg by mouth at bedtime.    . traMADol (ULTRAM) 50 MG tablet Take 1 tablet (50 mg total) by mouth every 6 (six) hours as needed.    BP 126/79   Pulse 74   Ht 5\' 2"  (1.575 m)   Wt 156 lb (70.8 kg)   BMI 28.53 kg/m   Physical Exam  Constitutional: She is oriented to person, place, and time. She appears well-developed and well-nourished.  Neurological: She is alert and oriented to  person, place, and time.  Psychiatric: She has a normal mood and affect. Judgment normal.  Vitals reviewed.   Ortho Exam   Gait and station show ambulatory limp  Inspection reveals 2 lacerations and subungual hematoma swelling of the great toe decreased range of motion of the IP joint of the great toe of the left foot No instability at the IP joint, no atrophy  in the foot of the toe week extension normal flexion Skin as described sanguinous drainage normal pulse left foot  Sensation in the toe was normal  Vascularity to the toe was normal  Right great toe normal alignments and skin normal motion color and capillary refill normal pulse right foot  Arther Abbott, MD  11/22/2017 11:20 AM

## 2017-12-01 ENCOUNTER — Inpatient Hospital Stay: Payer: Medicare Other | Attending: Adult Health | Admitting: Adult Health

## 2017-12-01 ENCOUNTER — Telehealth: Payer: Self-pay | Admitting: Adult Health

## 2017-12-01 ENCOUNTER — Encounter: Payer: Self-pay | Admitting: Adult Health

## 2017-12-01 VITALS — BP 161/84 | HR 69 | Temp 99.0°F | Resp 18 | Ht 62.0 in | Wt 160.2 lb

## 2017-12-01 DIAGNOSIS — C50411 Malignant neoplasm of upper-outer quadrant of right female breast: Secondary | ICD-10-CM

## 2017-12-01 DIAGNOSIS — Z86000 Personal history of in-situ neoplasm of breast: Secondary | ICD-10-CM

## 2017-12-01 NOTE — Patient Instructions (Signed)
Bone Health Bones protect organs, store calcium, and anchor muscles. Good health habits, such as eating nutritious foods and exercising regularly, are important for maintaining healthy bones. They can also help to prevent a condition that causes bones to lose density and become weak and brittle (osteoporosis). Why is bone mass important? Bone mass refers to the amount of bone tissue that you have. The higher your bone mass, the stronger your bones. An important step toward having healthy bones throughout life is to have strong and dense bones during childhood. A young adult who has a high bone mass is more likely to have a high bone mass later in life. Bone mass at its greatest it is called peak bone mass. A large decline in bone mass occurs in older adults. In women, it occurs about the time of menopause. During this time, it is important to practice good health habits, because if more bone is lost than what is replaced, the bones will become less healthy and more likely to break (fracture). If you find that you have a low bone mass, you may be able to prevent osteoporosis or further bone loss by changing your diet and lifestyle. How can I find out if my bone mass is low? Bone mass can be measured with an X-ray test that is called a bone mineral density (BMD) test. This test is recommended for all women who are age 65 or older. It may also be recommended for men who are age 70 or older, or for people who are more likely to develop osteoporosis due to:  Having bones that break easily.  Having a long-term disease that weakens bones, such as kidney disease or rheumatoid arthritis.  Having menopause earlier than normal.  Taking medicine that weakens bones, such as steroids, thyroid hormones, or hormone treatment for breast cancer or prostate cancer.  Smoking.  Drinking three or more alcoholic drinks each day.  What are the nutritional recommendations for healthy bones? To have healthy bones, you  need to get enough of the right minerals and vitamins. Most nutrition experts recommend getting these nutrients from the foods that you eat. Nutritional recommendations vary from person to person. Ask your health care provider what is healthy for you. Here are some general guidelines. Calcium Recommendations Calcium is the most important (essential) mineral for bone health. Most people can get enough calcium from their diet, but supplements may be recommended for people who are at risk for osteoporosis. Good sources of calcium include:  Dairy products, such as low-fat or nonfat milk, cheese, and yogurt.  Dark green leafy vegetables, such as bok choy and broccoli.  Calcium-fortified foods, such as orange juice, cereal, bread, soy beverages, and tofu products.  Nuts, such as almonds.  Follow these recommended amounts for daily calcium intake:  Children, age 1?3: 700 mg.  Children, age 4?8: 1,000 mg.  Children, age 9?13: 1,300 mg.  Teens, age 14?18: 1,300 mg.  Adults, age 19?50: 1,000 mg.  Adults, age 51?70: ? Men: 1,000 mg. ? Women: 1,200 mg.  Adults, age 71 or older: 1,200 mg.  Pregnant and breastfeeding females: ? Teens: 1,300 mg. ? Adults: 1,000 mg.  Vitamin D Recommendations Vitamin D is the most essential vitamin for bone health. It helps the body to absorb calcium. Sunlight stimulates the skin to make vitamin D, so be sure to get enough sunlight. If you live in a cold climate or you do not get outside often, your health care provider may recommend that you take vitamin   D supplements. Good sources of vitamin D in your diet include:  Egg yolks.  Saltwater fish.  Milk and cereal fortified with vitamin D.  Follow these recommended amounts for daily vitamin D intake:  Children and teens, age 1?18: 600 international units.  Adults, age 50 or younger: 400-800 international units.  Adults, age 51 or older: 800-1,000 international units.  Other Nutrients Other nutrients  for bone health include:  Phosphorus. This mineral is found in meat, poultry, dairy foods, nuts, and legumes. The recommended daily intake for adult men and adult women is 700 mg.  Magnesium. This mineral is found in seeds, nuts, dark green vegetables, and legumes. The recommended daily intake for adult men is 400?420 mg. For adult women, it is 310?320 mg.  Vitamin K. This vitamin is found in green leafy vegetables. The recommended daily intake is 120 mg for adult men and 90 mg for adult women.  What type of physical activity is best for building and maintaining healthy bones? Weight-bearing and strength-building activities are important for building and maintaining peak bone mass. Weight-bearing activities cause muscles and bones to work against gravity. Strength-building activities increases muscle strength that supports bones. Weight-bearing and muscle-building activities include:  Walking and hiking.  Jogging and running.  Dancing.  Gym exercises.  Lifting weights.  Tennis and racquetball.  Climbing stairs.  Aerobics.  Adults should get at least 30 minutes of moderate physical activity on most days. Children should get at least 60 minutes of moderate physical activity on most days. Ask your health care provide what type of exercise is best for you. Where can I find more information? For more information, check out the following websites:  National Osteoporosis Foundation: http://nof.org/learn/basics  National Institutes of Health: http://www.niams.nih.gov/Health_Info/Bone/Bone_Health/bone_health_for_life.asp  This information is not intended to replace advice given to you by your health care provider. Make sure you discuss any questions you have with your health care provider. Document Released: 09/11/2003 Document Revised: 01/09/2016 Document Reviewed: 06/26/2014 Elsevier Interactive Patient Education  2018 [employer]  

## 2017-12-01 NOTE — Progress Notes (Signed)
CLINIC:  Survivorship   REASON FOR VISIT:  Routine follow-up for history of breast cancer.   BRIEF ONCOLOGIC HISTORY:    Malignant neoplasm of upper-outer quadrant of right female breast ([place])   07/02/2011 Surgery    Right lumpectomy: DCIS ER 100%, PR 0%      07/23/2011 - 08/20/2011 Radiation Therapy    Adjuvant XRT      11/19/2011 - 11/30/2016 Anti-estrogen oral therapy    Aromasin 25 mg daily X 1 month then changed to Arimidex 1 mg daily        INTERVAL HISTORY:  Jane Wilkerson presents to the [facility] today for routine follow-up for her history of breast cancer.  Overall, she reports feeling quite well. She did recently break her left great toe and is wearing a boot today.  Her breasts continue to remain unchanged, she has occasional pain near her lumpectomy site.    Jane Wilkerson.  She has a PCP who she sees at least every year.  She is feeling well otherwise.      REVIEW OF SYSTEMS:  Review of Systems  Constitutional: Negative for appetite change, fatigue and fever.  HENT:   Negative for hearing loss, lump/mass and trouble swallowing.   Eyes: Negative for eye problems and icterus.  Respiratory: Negative for chest tightness, cough and shortness of breath.   Cardiovascular: Negative for chest pain, leg swelling and palpitations.  Gastrointestinal: Negative for abdominal distention, abdominal pain, constipation, diarrhea, nausea and vomiting.  Endocrine: Negative for hot flashes.  Skin: Negative for itching and rash.  Neurological: Negative for dizziness, extremity weakness, headaches and numbness.  Hematological: Negative for adenopathy. Does not bruise/bleed easily.  Psychiatric/Behavioral: Negative for depression. The patient is not nervous/anxious.   Breast: Denies any new nodularity, masses, tenderness, nipple changes, or nipple discharge.       PAST MEDICAL/SURGICAL HISTORY:  Past Medical History:  Diagnosis Date  . Anxiety  07/07/2011   takes Ativan every 4hrs  . Arthritis    back   . Asthma    uses inhaler prn  . Breast cancer ([place])    right - local excision, chemotheraphy  . Cataract    right eye  . Chronic back pain   . Depression    has appt to discuss depression 07/26/2011  . Diverticulosis   . Dizziness    occasionally  . Gastric ulcer   . GERD (gastroesophageal reflux disease)    takes Nexium daily  . Hemorrhoids   . History of nuclear stress test 11/2010   lexiscan; no evidence of inducible ischemia; normal pattern of perfusion   . History of shingles 2011  . Hx of colonic polyps   . Hyperlipidemia    takes Zocor daily  . Hypertension    takes Bystolic and Losartan daily  . Hypothyroidism    takes Synthroid daily  . Nausea alone    onset last Wednesday  08/04/11  . Nocturia   . Osteoporosis   . PONV (postoperative nausea and vomiting)   . PVC's (premature ventricular contractions)   . Shortness of breath    with exertion  . Urinary frequency    Past Surgical History:  Procedure Laterality Date  . ABDOMINAL HYSTERECTOMY  2001  . BREAST LUMPECTOMY Right 07/02/11   right breast and SNL  . BREAST SURGERY Left 1972   left lumpectomy  . BREAST SURGERY Right 08/11/11   margins on rt breast  . CATARACT EXTRACTION W/PHACO Right 08/31/2016  Procedure: CATARACT EXTRACTION PHACO AND INTRAOCULAR LENS PLACEMENT (IOC);  Surgeon: Rutherford Guys, MD;  Location: [facility];  Service: Ophthalmology;  Laterality: Right;  CDE: 8.46  . CATARACT EXTRACTION W/PHACO Left 09/14/2016   Procedure: CATARACT EXTRACTION PHACO AND INTRAOCULAR LENS PLACEMENT (IOC);  Surgeon: Rutherford Guys, MD;  Location: [facility];  Service: Ophthalmology;  Laterality: Left;  CDE: 9.32  . colonscopy    . HEMORRHOID SURGERY    . TRANSTHORACIC ECHOCARDIOGRAM  2009   normal LV & RV systolic function; mild mitral annular calcif & mild MR; trace TR     ALLERGIES:  Allergies  Allergen Reactions  . Hydrocodone Other (See Comments)     Reaction unknown  . Oxycodone Hcl Itching  . Ziac [Bisoprolol-Hydrochlorothiazide] Other (See Comments)    Reaction unknown  . Penicillins Rash    Has patient had a PCN reaction causing immediate rash, facial/tongue/throat swelling, SOB or lightheadedness with hypotension: unknown Has patient had a PCN reaction causing severe rash involving mucus membranes or skin necrosis: unknown Has patient had a PCN reaction that required hospitalization No Has patient had a PCN reaction occurring within the last 10 years: No If all of the above answers are "NO", then may proceed with Cephalosporin use.      CURRENT MEDICATIONS:  Outpatient Encounter Medications as of 12/01/2017  Medication Sig Note  . albuterol (PROVENTIL HFA;VENTOLIN HFA) 108 (90 BASE) MCG/ACT inhaler Inhale 2 puffs into the lungs every 6 (six) hours as needed for wheezing or shortness of breath.    Marland Kitchen alendronate (FOSAMAX) 70 MG tablet Take 1 tablet by mouth once a week. Wednesday 08/15/2015: Received from: External Pharmacy Received Sig:   . Calcium Carbonate (CALTRATE 600 PO) Take 600 mg by mouth daily.    . cephALEXin (KEFLEX) 500 MG capsule Take 1 capsule (500 mg total) by mouth 3 (three) times daily.   . citalopram (CELEXA) 20 MG tablet Take 20 mg by mouth daily. 08/18/2011: Pt taking 1/2 at night due to making sleepy  . fish oil-omega-3 fatty acids 1000 MG capsule Take 1 g by mouth daily.    . hydrocortisone cream 0.5 % Apply 1 application topically 2 (two) times daily as needed for itching. For hemorrhoid pain   . levothyroxine (SYNTHROID, LEVOTHROID) 50 MCG tablet Take 50 mcg by mouth daily.   Marland Kitchen LORazepam (ATIVAN) 1 MG tablet Take 1 mg by mouth at bedtime. For anxiety   . losartan (COZAAR) 100 MG tablet Take 100 mg by mouth daily.    . nebivolol (BYSTOLIC) 10 MG tablet Take 1 tablet (10 mg total) by mouth daily.   . simvastatin (ZOCOR) 40 MG tablet Take 40 mg by mouth at bedtime.     . traMADol (ULTRAM) 50 MG tablet Take 1  tablet (50 mg total) by mouth every 6 (six) hours as needed.   . [DISCONTINUED] esomeprazole (NEXIUM) 40 MG capsule Take 40 mg by mouth daily.     No facility-administered encounter medications on file as of 12/01/2017.      ONCOLOGIC FAMILY HISTORY:  Family History  Problem Relation Age of Onset  . Colon cancer Mother        also stroke  . Pancreatic cancer Father        also heart disease  . Diabetes Sister   . Hypertension Sister   . Anesthesia problems Neg Hx   . Hypotension Neg Hx   . Malignant hyperthermia Neg Hx   . Pseudochol deficiency Neg Hx  GENETIC COUNSELING/TESTING: Not at this time  SOCIAL HISTORY:  Social History   Socioeconomic History  . Marital status: Married    Spouse name: Not on file  . Number of children: Not on file  . Years of education: 54   . Highest education level: Not on file  Occupational History  . Occupation: retired    Fish farm manager: RETIRED  Social Needs  . Financial resource strain: Not on file  . Food insecurity:    Worry: Not on file    Inability: Not on file  . Transportation needs:    Medical: Not on file    Non-medical: Not on file  Tobacco Use  . Smoking status: Never Smoker  . Smokeless tobacco: Never Used  Substance and Sexual Activity  . Alcohol use: No  . Drug use: No  . Sexual activity: Yes    Birth control/protection: Surgical  Lifestyle  . Physical activity:    Days per week: Not on file    Minutes per session: Not on file  . Stress: Not on file  Relationships  . Social connections:    Talks on phone: Not on file    Gets together: Not on file    Attends religious service: Not on file    Active member of club or organization: Not on file    Attends meetings of clubs or organizations: Not on file    Relationship status: Not on file  . Intimate partner violence:    Fear of current or ex partner: Not on file    Emotionally abused: Not on file    Physically abused: Not on file    Forced sexual activity:  Not on file  Other Topics Concern  . Not on file  Social History Narrative   Married with three children   [name]- age 4, Lattie Haw age 73 and [place] age 81- all reside in [place]:  Vital Signs: Vitals:   12/01/17 1420  BP: (!) 161/84  Pulse: 69  Resp: 18  Temp: 99 F (37.2 C)  SpO2: 97%   Filed Weights   12/01/17 1420  Weight: 160 lb 3.2 oz (72.7 kg)   General: Well-nourished, well-appearing female in no acute distress.  Unaccompanied today.   HEENT: Head is normocephalic.  Pupils equal and reactive to light. Conjunctivae clear without exudate.  Sclerae anicteric. Oral mucosa is pink, moist.  Oropharynx is pink without lesions or erythema.  Lymph: No cervical, supraclavicular, or infraclavicular lymphadenopathy noted on palpation.  Cardiovascular: Regular rate and rhythm.Marland Kitchen Respiratory: Clear to auscultation bilaterally. Chest expansion symmetric; breathing non-labored.  Breast Exam:  -Left breast: No appreciable masses on palpation. No skin redness, thickening, or peau d'orange appearance; no nipple retraction or nipple discharge;  -Right breast: No appreciable masses on palpation. No skin redness, thickening, or peau d'orange appearance; no nipple retraction or nipple discharge; mild distortion in symmetry at previous lumpectomy site well healed scar without erythema or nodularity. -Axilla: No axillary adenopathy bilaterally.  GI: Abdomen soft and round; non-tender, non-distended. Bowel sounds normoactive. No hepatosplenomegaly.   GU: Deferred.  Neuro: No focal deficits. Steady gait.  Psych: Mood and affect normal and appropriate for situation.  MSK: No focal spinal tenderness to palpation, full range of motion in bilateral upper extremities Extremities: No edema. Skin: Warm and dry.  LABORATORY DATA:  None for this visit   DIAGNOSTIC IMAGING:  Most recent mammogram:      ASSESSMENT AND PLAN:  Jane Wilkerson is  a pleasant 77 y.o. female  with history of Stage 0 right breast DCIS, ER+/PR-, diagnosed in 06/2001, treated with lumpectomy, adjuvant radiation therapy, and anti-estrogen therapy with Anastrozole x 5 years completing therapy in 11/2016.  [facility] for [facility] and routine follow-up.   1. History of breast cancer:  Jane Wilkerson is currently clinically and radiographically without evidence of disease or recurrence of breast cancer. She will be due for mammogram in 08/2018; orders placed today.  She will return in one year for LTS follow up.   I encouraged her to call me with any questions or concerns before her next visit at the cancer center, and I would be happy to see her sooner, if needed.    2. Bone health:  Given Jane Wilkerson's age, history of breast cancer, and her previous anti-estrogen therapy with Anastrozole, she is at risk for bone demineralization. Her last DEXA scan was in 2016 and was consistent with osteopenia.  She is due for a repeat test.  Will defer to Dr. Collene Mares, recommended she have the test done at the same place she originally did.  She was given education on specific food and activities to promote bone health.  3. Cancer screening:  Due to Jane Wilkerson's history and her age, she should receive screening for skin cancers, colon cancer. She was encouraged to follow-up with her PCP for appropriate cancer screenings.   4. Health maintenance and wellness promotion: Jane Wilkerson was encouraged to consume 5-7 servings of fruits and vegetables per day. She was also encouraged to engage in moderate to vigorous Wilkerson for 30 minutes per day most days of the week. She was instructed to limit her alcohol consumption and continue to abstain from tobacco use.      Dispo:  -Return to cancer center in one year for LTS follow up -Mammogram in 08/2018   A total of (30) minutes of face-to-face time was spent with this patient with greater than 50% of that time in counseling and  care-coordination.   Gardenia Phlegm, NP Survivorship Program [facility] [phone]   Note: PRIMARY CARE PROVIDER [employer], [place] [phone]

## 2017-12-01 NOTE — Telephone Encounter (Signed)
Gave patient AVs and calendar of upcoming appointments.  °

## 2017-12-12 ENCOUNTER — Other Ambulatory Visit (HOSPITAL_COMMUNITY): Payer: Self-pay | Admitting: Family Medicine

## 2017-12-12 DIAGNOSIS — Z6828 Body mass index (BMI) 28.0-28.9, adult: Secondary | ICD-10-CM | POA: Diagnosis not present

## 2017-12-12 DIAGNOSIS — R7309 Other abnormal glucose: Secondary | ICD-10-CM | POA: Diagnosis not present

## 2017-12-12 DIAGNOSIS — E8940 Asymptomatic postprocedural ovarian failure: Secondary | ICD-10-CM

## 2017-12-12 DIAGNOSIS — E663 Overweight: Secondary | ICD-10-CM | POA: Diagnosis not present

## 2017-12-12 DIAGNOSIS — Z1389 Encounter for screening for other disorder: Secondary | ICD-10-CM | POA: Diagnosis not present

## 2017-12-21 ENCOUNTER — Ambulatory Visit: Payer: Medicare Other | Admitting: Orthopedic Surgery

## 2017-12-22 DIAGNOSIS — S92402A Displaced unspecified fracture of left great toe, initial encounter for closed fracture: Secondary | ICD-10-CM | POA: Insufficient documentation

## 2017-12-23 ENCOUNTER — Ambulatory Visit (INDEPENDENT_AMBULATORY_CARE_PROVIDER_SITE_OTHER): Payer: Medicare Other

## 2017-12-23 ENCOUNTER — Ambulatory Visit (INDEPENDENT_AMBULATORY_CARE_PROVIDER_SITE_OTHER): Payer: Medicare Other | Admitting: Orthopedic Surgery

## 2017-12-23 ENCOUNTER — Encounter: Payer: Self-pay | Admitting: Orthopedic Surgery

## 2017-12-23 DIAGNOSIS — S92425D Nondisplaced fracture of distal phalanx of left great toe, subsequent encounter for fracture with routine healing: Secondary | ICD-10-CM | POA: Diagnosis not present

## 2017-12-23 NOTE — Progress Notes (Signed)
Fracture care follow-up fracture distal phalanx left great toe  Date of injury April 18  Chief Complaint  Patient presents with  . Foot Injury    left great toe fracture 11/19/17   Clinical exam shows a well aligned toe with no subungual hematoma no signs of infection normal sensation   X-rays today were obtained the fracture is in good position without displacement or angulation see dictated report  Plan is to release her to normal activity

## 2018-01-17 DIAGNOSIS — Z961 Presence of intraocular lens: Secondary | ICD-10-CM | POA: Diagnosis not present

## 2018-01-20 DIAGNOSIS — D509 Iron deficiency anemia, unspecified: Secondary | ICD-10-CM | POA: Diagnosis not present

## 2018-01-20 DIAGNOSIS — N183 Chronic kidney disease, stage 3 (moderate): Secondary | ICD-10-CM | POA: Diagnosis not present

## 2018-01-20 DIAGNOSIS — I10 Essential (primary) hypertension: Secondary | ICD-10-CM | POA: Diagnosis not present

## 2018-01-20 DIAGNOSIS — R809 Proteinuria, unspecified: Secondary | ICD-10-CM | POA: Diagnosis not present

## 2018-01-20 DIAGNOSIS — E559 Vitamin D deficiency, unspecified: Secondary | ICD-10-CM | POA: Diagnosis not present

## 2018-01-20 DIAGNOSIS — Z79899 Other long term (current) drug therapy: Secondary | ICD-10-CM | POA: Diagnosis not present

## 2018-01-25 DIAGNOSIS — N183 Chronic kidney disease, stage 3 (moderate): Secondary | ICD-10-CM | POA: Diagnosis not present

## 2018-01-25 DIAGNOSIS — D649 Anemia, unspecified: Secondary | ICD-10-CM | POA: Diagnosis not present

## 2018-01-25 DIAGNOSIS — R809 Proteinuria, unspecified: Secondary | ICD-10-CM | POA: Diagnosis not present

## 2018-01-25 DIAGNOSIS — I10 Essential (primary) hypertension: Secondary | ICD-10-CM | POA: Diagnosis not present

## 2018-06-05 DIAGNOSIS — Z6828 Body mass index (BMI) 28.0-28.9, adult: Secondary | ICD-10-CM | POA: Diagnosis not present

## 2018-06-05 DIAGNOSIS — Z1389 Encounter for screening for other disorder: Secondary | ICD-10-CM | POA: Diagnosis not present

## 2018-06-05 DIAGNOSIS — H8113 Benign paroxysmal vertigo, bilateral: Secondary | ICD-10-CM | POA: Diagnosis not present

## 2018-06-05 DIAGNOSIS — E063 Autoimmune thyroiditis: Secondary | ICD-10-CM | POA: Diagnosis not present

## 2018-06-05 DIAGNOSIS — J329 Chronic sinusitis, unspecified: Secondary | ICD-10-CM | POA: Diagnosis not present

## 2018-06-05 DIAGNOSIS — K219 Gastro-esophageal reflux disease without esophagitis: Secondary | ICD-10-CM | POA: Diagnosis not present

## 2018-07-20 DIAGNOSIS — E2839 Other primary ovarian failure: Secondary | ICD-10-CM | POA: Diagnosis not present

## 2018-07-27 DIAGNOSIS — I10 Essential (primary) hypertension: Secondary | ICD-10-CM | POA: Diagnosis not present

## 2018-07-27 DIAGNOSIS — R809 Proteinuria, unspecified: Secondary | ICD-10-CM | POA: Diagnosis not present

## 2018-07-27 DIAGNOSIS — N183 Chronic kidney disease, stage 3 (moderate): Secondary | ICD-10-CM | POA: Diagnosis not present

## 2018-07-27 DIAGNOSIS — E559 Vitamin D deficiency, unspecified: Secondary | ICD-10-CM | POA: Diagnosis not present

## 2018-07-27 DIAGNOSIS — Z1159 Encounter for screening for other viral diseases: Secondary | ICD-10-CM | POA: Diagnosis not present

## 2018-07-27 DIAGNOSIS — D509 Iron deficiency anemia, unspecified: Secondary | ICD-10-CM | POA: Diagnosis not present

## 2018-08-02 DIAGNOSIS — N2581 Secondary hyperparathyroidism of renal origin: Secondary | ICD-10-CM | POA: Diagnosis not present

## 2018-08-02 DIAGNOSIS — N183 Chronic kidney disease, stage 3 (moderate): Secondary | ICD-10-CM | POA: Diagnosis not present

## 2018-08-14 DIAGNOSIS — E7849 Other hyperlipidemia: Secondary | ICD-10-CM | POA: Diagnosis not present

## 2018-08-14 DIAGNOSIS — I77810 Thoracic aortic ectasia: Secondary | ICD-10-CM | POA: Diagnosis not present

## 2018-08-14 DIAGNOSIS — K219 Gastro-esophageal reflux disease without esophagitis: Secondary | ICD-10-CM | POA: Diagnosis not present

## 2018-08-14 DIAGNOSIS — I10 Essential (primary) hypertension: Secondary | ICD-10-CM | POA: Diagnosis not present

## 2018-08-14 DIAGNOSIS — E663 Overweight: Secondary | ICD-10-CM | POA: Diagnosis not present

## 2018-08-14 DIAGNOSIS — Z6828 Body mass index (BMI) 28.0-28.9, adult: Secondary | ICD-10-CM | POA: Diagnosis not present

## 2018-08-14 DIAGNOSIS — E669 Obesity, unspecified: Secondary | ICD-10-CM | POA: Diagnosis not present

## 2018-08-14 DIAGNOSIS — E063 Autoimmune thyroiditis: Secondary | ICD-10-CM | POA: Diagnosis not present

## 2018-08-14 DIAGNOSIS — Z1389 Encounter for screening for other disorder: Secondary | ICD-10-CM | POA: Diagnosis not present

## 2018-08-14 DIAGNOSIS — C50411 Malignant neoplasm of upper-outer quadrant of right female breast: Secondary | ICD-10-CM | POA: Diagnosis not present

## 2018-08-14 DIAGNOSIS — N181 Chronic kidney disease, stage 1: Secondary | ICD-10-CM | POA: Diagnosis not present

## 2018-08-14 DIAGNOSIS — M8580 Other specified disorders of bone density and structure, unspecified site: Secondary | ICD-10-CM | POA: Diagnosis not present

## 2018-08-14 DIAGNOSIS — Z0001 Encounter for general adult medical examination with abnormal findings: Secondary | ICD-10-CM | POA: Diagnosis not present

## 2018-08-14 DIAGNOSIS — J302 Other seasonal allergic rhinitis: Secondary | ICD-10-CM | POA: Diagnosis not present

## 2018-09-28 ENCOUNTER — Other Ambulatory Visit (HOSPITAL_COMMUNITY): Payer: Self-pay | Admitting: Internal Medicine

## 2018-09-28 DIAGNOSIS — Z1231 Encounter for screening mammogram for malignant neoplasm of breast: Secondary | ICD-10-CM

## 2018-11-07 ENCOUNTER — Telehealth: Payer: Self-pay | Admitting: *Deleted

## 2018-11-07 NOTE — Telephone Encounter (Signed)
11/07/18 No answer/Busy, e-mail forward to patient.

## 2018-11-15 ENCOUNTER — Other Ambulatory Visit: Payer: Self-pay

## 2018-11-15 ENCOUNTER — Ambulatory Visit (HOSPITAL_COMMUNITY)
Admission: RE | Admit: 2018-11-15 | Discharge: 2018-11-15 | Disposition: A | Discharge status: Home / Self Care | Payer: Medicare Other | Source: Ambulatory Visit | Attending: Internal Medicine | Admitting: Internal Medicine

## 2018-11-15 DIAGNOSIS — Z1231 Encounter for screening mammogram for malignant neoplasm of breast: Secondary | ICD-10-CM | POA: Insufficient documentation

## 2018-11-23 ENCOUNTER — Telehealth: Payer: Self-pay | Admitting: Hematology and Oncology

## 2018-11-23 NOTE — Telephone Encounter (Signed)
Per 5/21 schedule message moved 6/1 LTS visit to 6/3 as telephone visit. Confirmed with spouse. Not smart device capability.

## 2018-12-04 ENCOUNTER — Encounter: Payer: Medicare Other | Admitting: Adult Health

## 2018-12-06 ENCOUNTER — Inpatient Hospital Stay: Payer: Medicare Other | Attending: Adult Health | Admitting: Adult Health

## 2018-12-06 ENCOUNTER — Encounter: Payer: Self-pay | Admitting: Adult Health

## 2018-12-06 DIAGNOSIS — Z853 Personal history of malignant neoplasm of breast: Secondary | ICD-10-CM

## 2018-12-06 DIAGNOSIS — C50411 Malignant neoplasm of upper-outer quadrant of right female breast: Secondary | ICD-10-CM

## 2018-12-06 NOTE — Patient Instructions (Signed)

## 2018-12-06 NOTE — Progress Notes (Signed)
SURVIVORSHIP VIRTUAL VISIT:  I connected with Jane Wilkerson on 12/06/18 at  8:00 AM EDT by telephone and verified that I am speaking with the correct person using two identifiers.   I discussed the limitations, risks, security and privacy concerns of performing an evaluation and management service by telephone and the availability of in person appointments. I also discussed with the patient that there may be a patient responsible charge related to this service. The patient expressed understanding and agreed to proceed.     REASON FOR VISIT:  Routine follow-up for history of breast cancer.   BRIEF ONCOLOGIC HISTORY:    Malignant neoplasm of upper-outer quadrant of right female breast ([place])   07/02/2011 Surgery    Right lumpectomy: DCIS ER 100%, PR 0%    07/23/2011 - 08/20/2011 Radiation Therapy    Adjuvant XRT    11/19/2011 - 11/30/2016 Anti-estrogen oral therapy    Aromasin 25 mg daily X 1 month then changed to Arimidex 1 mg daily      INTERVAL HISTORY:  Jane Wilkerson presents to the [facility] today for routine follow-up for her history of breast cancer.  Overall, she reports feeling quite well. She notes she has been feeling well this past year.  She notes she has had an addition of a blood pressure medication.  She sees Dr. Gerarda Fraction as her PCP regularly.    She underwent her bilateral screening mammogram on 11/15/2018 that showed no mammographic evidence of malignancy, it showed breast density b.  Jane Wilkerson is taking Fosamax for her bones.  She underwent bone density testing.  I do not have these results, she had this done a different location and cannot recall the name.    Jane Wilkerson has had an increase in her achiness due to her arthritis.  This is improving.  Jane Wilkerson is exercising regularly.  She is working out in the yard.  She is not following any particular diet.  She avoids salt because she doesn't like it.  She has h/o fatigue.  She drinks about 2.5 bottles of water per day.    She  continues to f/u about her indigestion, and is working on dietary modifications, since stopping her nexium.  She was recommended to stop this by her nephrologist.     REVIEW OF SYSTEMS:  Review of Systems  Constitutional: Positive for fatigue. Negative for appetite change, chills and unexpected weight change.  HENT:   Negative for hearing loss, lump/mass, nosebleeds, sore throat and trouble swallowing.   Eyes: Negative for eye problems and icterus.  Respiratory: Positive for cough (as related to her asthma). Negative for chest tightness and shortness of breath.   Cardiovascular: Negative for chest pain, leg swelling and palpitations.  Gastrointestinal: Negative for abdominal distention, abdominal pain, constipation, diarrhea, nausea and vomiting.  Endocrine: Negative for hot flashes.  Genitourinary: Negative for difficulty urinating.   Musculoskeletal: Positive for arthralgias (h/o osteorarthritis).  Skin: Negative for itching and rash.  Neurological: Negative for dizziness, extremity weakness, headaches and numbness.  Hematological: Negative for adenopathy. Does not bruise/bleed easily.  Psychiatric/Behavioral: Negative for depression. The patient is not nervous/anxious.   Breast: Denies any new nodularity, masses, tenderness, nipple changes, or nipple discharge.    PAST MEDICAL/SURGICAL HISTORY:  Past Medical History:  Diagnosis Date  . Anxiety 07/07/2011   takes Ativan every 4hrs  . Arthritis    back   . Asthma    uses inhaler prn  . Breast cancer ([place])    right - local excision, chemotheraphy  .  Cataract    right eye  . Chronic back pain   . Depression    has appt to discuss depression 07/26/2011  . Diverticulosis   . Dizziness    occasionally  . Gastric ulcer   . GERD (gastroesophageal reflux disease)    takes Nexium daily  . Hemorrhoids   . History of nuclear stress test 11/2010   lexiscan; no evidence of inducible ischemia; normal pattern of perfusion   . History of  shingles 2011  . Hx of colonic polyps   . Hyperlipidemia    takes Zocor daily  . Hypertension    takes Bystolic and Losartan daily  . Hypothyroidism    takes Synthroid daily  . Nausea alone    onset last Wednesday  08/04/11  . Nocturia   . Osteoporosis   . PONV (postoperative nausea and vomiting)   . PVC's (premature ventricular contractions)   . Shortness of breath    with exertion  . Urinary frequency    Past Surgical History:  Procedure Laterality Date  . ABDOMINAL HYSTERECTOMY  2001  . BREAST LUMPECTOMY Right 07/02/11   right breast and SNL  . BREAST SURGERY Left 1972   left lumpectomy  . BREAST SURGERY Right 08/11/11   margins on rt breast  . CATARACT EXTRACTION W/PHACO Right 08/31/2016   Procedure: CATARACT EXTRACTION PHACO AND INTRAOCULAR LENS PLACEMENT ([place]);  Surgeon: Rutherford Guys, MD;  Location: [facility];  Service: Ophthalmology;  Laterality: Right;  CDE: 8.46  . CATARACT EXTRACTION W/PHACO Left 09/14/2016   Procedure: CATARACT EXTRACTION PHACO AND INTRAOCULAR LENS PLACEMENT ([facility]);  Surgeon: Rutherford Guys, MD;  Location: [facility];  Service: Ophthalmology;  Laterality: Left;  CDE: 9.32  . colonscopy    . HEMORRHOID SURGERY    . TRANSTHORACIC ECHOCARDIOGRAM  2009   normal LV & RV systolic function; mild mitral annular calcif & mild MR; trace TR     ALLERGIES:  Allergies  Allergen Reactions  . Hydrocodone Other (See Comments)    Reaction unknown  . Oxycodone Hcl Itching  . Ziac [Bisoprolol-Hydrochlorothiazide] Other (See Comments)    Reaction unknown  . Penicillins Rash    Has patient had a PCN reaction causing immediate rash, facial/tongue/throat swelling, SOB or lightheadedness with hypotension: unknown Has patient had a PCN reaction causing severe rash involving mucus membranes or skin necrosis: unknown Has patient had a PCN reaction that required hospitalization No Has patient had a PCN reaction occurring within the last 10 years: No If all of the above answers are  "NO", then may proceed with Cephalosporin use.      CURRENT MEDICATIONS:  Outpatient Encounter Medications as of 12/06/2018  Medication Sig Note  . albuterol (PROVENTIL HFA;VENTOLIN HFA) 108 (90 BASE) MCG/ACT inhaler Inhale 2 puffs into the lungs every 6 (six) hours as needed for wheezing or shortness of breath.    Marland Kitchen alendronate (FOSAMAX) 70 MG tablet Take 1 tablet by mouth once a week. Wednesday 08/15/2015: Received from: External Pharmacy Received Sig:   . Calcium Carbonate (CALTRATE 600 PO) Take 600 mg by mouth daily.    . citalopram (CELEXA) 20 MG tablet Take 20 mg by mouth daily. 08/18/2011: Pt taking 1/2 at night due to making sleepy  . fish oil-omega-3 fatty acids 1000 MG capsule Take 1 g by mouth daily.    . hydrocortisone cream 0.5 % Apply 1 application topically 2 (two) times daily as needed for itching. For hemorrhoid pain   . levothyroxine (SYNTHROID, LEVOTHROID) 50 MCG tablet Take  50 mcg by mouth daily.   Marland Kitchen LORazepam (ATIVAN) 1 MG tablet Take 1 mg by mouth at bedtime. For anxiety   . losartan (COZAAR) 100 MG tablet Take 100 mg by mouth daily.    . nebivolol (BYSTOLIC) 10 MG tablet Take 1 tablet (10 mg total) by mouth daily.   . simvastatin (ZOCOR) 40 MG tablet Take 40 mg by mouth at bedtime.      No facility-administered encounter medications on file as of 12/06/2018.      ONCOLOGIC FAMILY HISTORY:  Family History  Problem Relation Age of Onset  . Colon cancer Mother        also stroke  . Pancreatic cancer Father        also heart disease  . Diabetes Sister   . Hypertension Sister   . Anesthesia problems Neg Hx   . Hypotension Neg Hx   . Malignant hyperthermia Neg Hx   . Pseudochol deficiency Neg Hx     GENETIC COUNSELING/TESTING: Not at this time  SOCIAL HISTORY:  Social History   Socioeconomic History  . Marital status: Married    Spouse name: Not on file  . Number of children: Not on file  . Years of education: 6   . Highest education level: Not on file   Occupational History  . Occupation: retired    Fish farm manager: RETIRED  Social Needs  . Financial resource strain: Not on file  . Food insecurity:    Worry: Not on file    Inability: Not on file  . Transportation needs:    Medical: Not on file    Non-medical: Not on file  Tobacco Use  . Smoking status: Never Smoker  . Smokeless tobacco: Never Used  Substance and Sexual Activity  . Alcohol use: No  . Drug use: No  . Sexual activity: Yes    Birth control/protection: Surgical  Lifestyle  . Physical activity:    Days per week: Not on file    Minutes per session: Not on file  . Stress: Not on file  Relationships  . Social connections:    Talks on phone: Not on file    Gets together: Not on file    Attends religious service: Not on file    Active member of club or organization: Not on file    Attends meetings of clubs or organizations: Not on file    Relationship status: Not on file  . Intimate partner violence:    Fear of current or ex partner: Not on file    Emotionally abused: Not on file    Physically abused: Not on file    Forced sexual activity: Not on file  Other Topics Concern  . Not on file  Social History Narrative   Married with three children   [name]- age 11, Lattie Haw age 84 and [place] age 63- all reside in [place]      OBJECTIVE:  Patient sound well and is in no apparent distress.  Speech is normal.  Breathing non labored, mood and behavior are normal.   LABORATORY DATA:  None for this visit   DIAGNOSTIC IMAGING:  Most recent mammogram:     ASSESSMENT AND PLAN:  Ms.. Wilkerson is a pleasant 78 y.o. female with history of Stage 0 right breast DCIS, ER+/PR+, diagnosed in 2012, treated with lumpectomy, adjuvant radiation therapy, and anti-estrogen therapy with Aromasin/Anastrozole x 5 years completed in 11/2016.  [facility] for [facility] and routine follow-up.   1. History  of breast cancer:  Jane Wilkerson is currently clinically and  radiographically without evidence of disease or recurrence of breast cancer. She will be due for mammogram in 11/2019. She will return in 6 months to one year for repeat LTS surveillance.  I encouraged her to call me with any questions or concerns before her next visit at the cancer center, and I would be happy to see her sooner, if needed.    2. Bone health:  Given Jane Wilkerson age, history of breast cancer, and her previous anti-estrogen therapy with aromatase inhibitors, she is at risk for bone demineralization. Her last bone density was this year, and I have asked Jane Wilkerson to get the name of the place in [place] so that we can request her bone density results.  She was given education on specific food and activities to promote bone health.  3. Cancer screening:  Due to Jane Wilkerson history and her age, she should receive screening for skin cancers, colon cancer. She was encouraged to follow-up with her PCP for appropriate cancer screenings.   4. Health maintenance and wellness promotion: Jane Wilkerson was encouraged to consume 5-7 servings of fruits and vegetables per day. She was also encouraged to engage in moderate to vigorous exercise for 30 minutes per day most days of the week. She was instructed to limit her alcohol consumption and continue to abstain from tobacco use.      Follow up instructions:    -Return to cancer center in one year for LTS follow up  -Mammogram due in 11/2019   The patient was provided an opportunity to ask questions and all were answered. The patient agreed with the plan and demonstrated an understanding of the instructions.   The patient was advised to call back or seek an in-person evaluation if the symptoms worsen or if the condition fails to improve as anticipated.   I provided 25 minutes of non face-to-face telephone visit time during this encounter, and > 50% was spent counseling as documented under my assessment & plan.   Jane Phlegm, NP Survivorship  Program [facility] [phone]   Note: PRIMARY CARE PROVIDER [employer], [place] [phone]

## 2018-12-07 ENCOUNTER — Telehealth: Payer: Self-pay | Admitting: Adult Health

## 2018-12-07 NOTE — Telephone Encounter (Signed)
Talk with patient regarding schedule °

## 2018-12-19 DIAGNOSIS — E663 Overweight: Secondary | ICD-10-CM | POA: Diagnosis not present

## 2018-12-19 DIAGNOSIS — Z6828 Body mass index (BMI) 28.0-28.9, adult: Secondary | ICD-10-CM | POA: Diagnosis not present

## 2018-12-19 DIAGNOSIS — M545 Low back pain: Secondary | ICD-10-CM | POA: Diagnosis not present

## 2018-12-19 DIAGNOSIS — R319 Hematuria, unspecified: Secondary | ICD-10-CM | POA: Diagnosis not present

## 2019-01-23 DIAGNOSIS — Z961 Presence of intraocular lens: Secondary | ICD-10-CM | POA: Diagnosis not present

## 2019-03-16 ENCOUNTER — Other Ambulatory Visit: Payer: Self-pay

## 2019-03-16 DIAGNOSIS — J4520 Mild intermittent asthma, uncomplicated: Secondary | ICD-10-CM | POA: Diagnosis not present

## 2019-03-16 DIAGNOSIS — J069 Acute upper respiratory infection, unspecified: Secondary | ICD-10-CM | POA: Diagnosis not present

## 2019-03-16 DIAGNOSIS — Z20822 Contact with and (suspected) exposure to covid-19: Secondary | ICD-10-CM

## 2019-03-16 DIAGNOSIS — I10 Essential (primary) hypertension: Secondary | ICD-10-CM | POA: Diagnosis not present

## 2019-03-17 LAB — NOVEL CORONAVIRUS, NAA: SARS-CoV-2, NAA: NOT DETECTED

## 2019-04-26 ENCOUNTER — Encounter: Payer: Self-pay | Admitting: Internal Medicine

## 2019-04-26 ENCOUNTER — Telehealth (INDEPENDENT_AMBULATORY_CARE_PROVIDER_SITE_OTHER): Payer: Medicare Other | Admitting: Internal Medicine

## 2019-04-26 VITALS — BP 128/75 | HR 88

## 2019-04-26 DIAGNOSIS — R002 Palpitations: Secondary | ICD-10-CM

## 2019-04-26 DIAGNOSIS — I10 Essential (primary) hypertension: Secondary | ICD-10-CM

## 2019-04-26 DIAGNOSIS — I77810 Thoracic aortic ectasia: Secondary | ICD-10-CM

## 2019-04-26 DIAGNOSIS — R0609 Other forms of dyspnea: Secondary | ICD-10-CM

## 2019-04-26 DIAGNOSIS — R0600 Dyspnea, unspecified: Secondary | ICD-10-CM

## 2019-04-26 DIAGNOSIS — R0989 Other specified symptoms and signs involving the circulatory and respiratory systems: Secondary | ICD-10-CM

## 2019-04-26 DIAGNOSIS — E785 Hyperlipidemia, unspecified: Secondary | ICD-10-CM

## 2019-04-26 DIAGNOSIS — Z79899 Other long term (current) drug therapy: Secondary | ICD-10-CM

## 2019-04-26 DIAGNOSIS — E039 Hypothyroidism, unspecified: Secondary | ICD-10-CM

## 2019-04-26 NOTE — Progress Notes (Signed)
Virtual Visit via Telephone Note   This visit type was conducted due to national recommendations for restrictions regarding the COVID-19 Pandemic (e.g. social distancing) in an effort to limit this patient's exposure and mitigate transmission in our community.  Due to her co-morbid illnesses, this patient is at least at moderate risk for complications without adequate follow up.  This format is felt to be most appropriate for this patient at this time.  The patient did not have access to video technology/had technical difficulties with video requiring transitioning to audio format only (telephone).  All issues noted in this document were discussed and addressed.  No physical exam could be performed with this format.  Please refer to the patient's chart for her  consent to telehealth for [facility].   Evaluation Performed:  Telephone follow-up  Date:  04/26/2019   ID:  Jane Wilkerson, DOB [birth year], MRN [id number]  Patient Location:  [address] [place] [address]  Provider location:   [address], [place], [place] [address]  PCP:  [facility] [employer], MD  Cardiologist:  No primary care provider on file. Electrophysiologist:  None   Chief Complaint:  Weakness, occasional low blood pressure  History of Present Illness:    Jane Wilkerson is a 78 y.o. female who presents via audio/video conferencing for a telehealth visit today.  Jane Wilkerson was seen today in follow-up.  She reports very infrequent episodes of significant hypotension with blood pressure in the 70s.  This is labile hypertension which she has a history of.  Her nephrologist Dr. Lowanda Foster had been more strictly controlling her blood pressure which seems to be at target however this has led I think to some episodes of labile hypertension.  Most recently when she had this episode she felt very fatigued and with no energy as expected.  She denies any chest pain or shortness of breath.  She has a history of PVCs which  have not been significantly worse.  She is overdue for lab work.  She had seen her PCP however they did not order the labs that she was expecting to see.  She is under a lot of stress and is grieving now with the death of her sister who had nonalcoholic cirrhosis.  The patient does not have symptoms concerning for COVID-19 infection (fever, chills, cough, or new SHORTNESS OF BREATH).    Prior CV studies:   The following studies were reviewed today:  Labwork Chart reviewed  PMHx:  Past Medical History:  Diagnosis Date  . Anxiety 07/07/2011   takes Ativan every 4hrs  . Arthritis    back   . Asthma    uses inhaler prn  . Breast cancer ([place])    right - local excision, chemotheraphy  . Cataract    right eye  . Chronic back pain   . Depression    has appt to discuss depression 07/26/2011  . Diverticulosis   . Dizziness    occasionally  . Gastric ulcer   . GERD (gastroesophageal reflux disease)    takes Nexium daily  . Hemorrhoids   . History of nuclear stress test 11/2010   lexiscan; no evidence of inducible ischemia; normal pattern of perfusion   . History of shingles 2011  . Hx of colonic polyps   . Hyperlipidemia    takes Zocor daily  . Hypertension    takes Bystolic and Losartan daily  . Hypothyroidism    takes Synthroid daily  . Nausea alone  onset last Wednesday  08/04/11  . Nocturia   . Osteoporosis   . PONV (postoperative nausea and vomiting)   . PVC's (premature ventricular contractions)   . Shortness of breath    with exertion  . Urinary frequency     Past Surgical History:  Procedure Laterality Date  . ABDOMINAL HYSTERECTOMY  2001  . BREAST LUMPECTOMY Right 07/02/11   right breast and SNL  . BREAST SURGERY Left 1972   left lumpectomy  . BREAST SURGERY Right 08/11/11   margins on rt breast  . CATARACT EXTRACTION W/PHACO Right 08/31/2016   Procedure: CATARACT EXTRACTION PHACO AND INTRAOCULAR LENS PLACEMENT ([place]);  Surgeon: Rutherford Guys, MD;  Location: [facility];  Service: Ophthalmology;  Laterality: Right;  CDE: 8.46  . CATARACT EXTRACTION W/PHACO Left 09/14/2016   Procedure: CATARACT EXTRACTION PHACO AND INTRAOCULAR LENS PLACEMENT ([facility]);  Surgeon: Rutherford Guys, MD;  Location: [facility];  Service: Ophthalmology;  Laterality: Left;  CDE: 9.32  . colonscopy    . HEMORRHOID SURGERY    . TRANSTHORACIC ECHOCARDIOGRAM  2009   normal LV & RV systolic function; mild mitral annular calcif & mild MR; trace TR    FAMHx:  Family History  Problem Relation Age of Onset  . Colon cancer Mother        also stroke  . Pancreatic cancer Father        also heart disease  . Diabetes Sister   . Hypertension Sister   . Anesthesia problems Neg Hx   . Hypotension Neg Hx   . Malignant hyperthermia Neg Hx   . Pseudochol deficiency Neg Hx     SOCHx:   reports that she has never smoked. She has never used smokeless tobacco. She reports that she does not drink alcohol or use drugs.  ALLERGIES:  Allergies  Allergen Reactions  . Hydrocodone Other (See Comments)    Reaction unknown  . Oxycodone Hcl Itching  . Ziac [Bisoprolol-Hydrochlorothiazide] Other (See Comments)    Reaction unknown  . Penicillins Rash    Has patient had a PCN reaction causing immediate rash, facial/tongue/throat swelling, SOB or lightheadedness with hypotension: unknown Has patient had a PCN reaction causing severe rash involving mucus membranes or skin necrosis: unknown Has patient had a PCN reaction that required hospitalization No Has patient had a PCN reaction occurring within the last 10 years: No If all of the above answers are "NO", then may proceed with Cephalosporin use.     MEDS:  Current Meds  Medication Sig  . albuterol (PROVENTIL HFA;VENTOLIN HFA) 108 (90 BASE) MCG/ACT inhaler Inhale 2 puffs into the lungs every 6 (six) hours as needed for wheezing or shortness of breath.   Marland Kitchen alendronate (FOSAMAX) 70 MG tablet Take 1 tablet by mouth once a week. Wednesday  . amLODipine  (NORVASC) 5 MG tablet Take 5 mg by mouth daily.  . Calcium Carbonate (CALTRATE 600 PO) Take 600 mg by mouth daily.   . citalopram (CELEXA) 20 MG tablet Take 20 mg by mouth daily.  . fish oil-omega-3 fatty acids 1000 MG capsule Take 1 g by mouth daily.   . hydrocortisone cream 0.5 % Apply 1 application topically 2 (two) times daily as needed for itching. For hemorrhoid pain  . levothyroxine (SYNTHROID, LEVOTHROID) 50 MCG tablet Take 50 mcg by mouth daily.  Marland Kitchen LORazepam (ATIVAN) 1 MG tablet Take 1 mg by mouth at bedtime. For anxiety  . losartan (COZAAR) 100 MG tablet Take 100 mg by mouth daily.   Marland Kitchen  nebivolol (BYSTOLIC) 10 MG tablet Take 1 tablet (10 mg total) by mouth daily.  . simvastatin (ZOCOR) 40 MG tablet Take 40 mg by mouth at bedtime.       ROS: Pertinent items noted in HPI and remainder of comprehensive ROS otherwise negative.  Labs/Other Tests and Data Reviewed:    Recent Labs: No results found for requested labs within last 8760 hours.   Recent Lipid Panel Lab Results  Component Value Date/Time   CHOL 186 10/04/2016 10:22 AM   CHOL 169 05/11/2013 10:21 AM   TRIG 211 (H) 10/04/2016 10:22 AM   TRIG 231 (H) 05/11/2013 10:21 AM   HDL 56 10/04/2016 10:22 AM   HDL 47 05/11/2013 10:21 AM   CHOLHDL 3.3 10/04/2016 10:22 AM   LDLCALC 88 10/04/2016 10:22 AM   LDLCALC 76 05/11/2013 10:21 AM    Wt Readings from Last 3 Encounters:  12/23/17 158 lb (71.7 kg)  12/01/17 160 lb 3.2 oz (72.7 kg)  11/22/17 156 lb (70.8 kg)     Exam:    Vital Signs:  BP 128/75   Pulse 88    Exam not performed due to telephone visit  ASSESSMENT & PLAN:    1. Chest pressure/dyspnea on exertion - low risk stress test and echocardiogram with normal LV function (08/2015) 2. Top normal ascending aorta 3.7 cm 3. Palpitations-generally well controlled 4. Dyslipidemia 5. Recent history of breast cancer 6. Abnormal renal function by report 7. Labile hypertension 8. Anxiety  Overall [place] seems to be  doing well although is anxious and somewhat depressed with the recent death of her sister which is understandable.  Her palpitations have been generally well controlled.  She will need repeat labs which were ordered today.  She does have labile hypertension with a recent episode of hypotension.  I advised her if she feels unwell to test her blood pressure and if it is low and she has not taken her medications to hold them, drink plenty water and lay in bed until her blood pressure her symptoms improve.  She had in the past had a top normal ascending aorta however measures between 3.6 and 3.7 cm and therefore is not significantly dilated.  COVID-19 Education: The signs and symptoms of COVID-19 were discussed with the patient and how to seek care for testing (follow up with PCP or arrange E-visit).  The importance of social distancing was discussed today.  Patient Risk:   After full review of this patients clinical status, I feel that they are at least moderate risk at this time.  Time:   Today, I have spent 25 minutes with the patient with telehealth technology discussing labile hypertension, low BP, top normal aorta.     Medication Adjustments/Labs and Tests Ordered: Current medicines are reviewed at length with the patient today.  Concerns regarding medicines are outlined above.   Tests Ordered: No orders of the defined types were placed in this encounter.   Medication Changes: No orders of the defined types were placed in this encounter.   Disposition:  in 1 year(s)  Pixie Casino, MD, [facility], [place] Director of the Advanced Lipid Disorders &  Cardiovascular Risk Reduction Clinic Diplomate of the American Board of Clinical Lipidology Attending Cardiologist  Direct Dial: [phone]  Fax: [phone]  Website:  [email or web address]  Pixie Casino, MD  04/26/2019 9:07 AM

## 2019-04-26 NOTE — Patient Instructions (Addendum)
Medication Instructions:  Your physician recommends that you continue on your current medications as directed. Please refer to the Current Medication list given to you today.  *If you need a refill on your cardiac medications before your next appointment, please call your pharmacy*  Lab Work: FASTING labs to check lipid panel, metabolic panel, cholesterol  You can use any [facility] testing location convenient for you  If you have labs (blood work) drawn today and your tests are completely normal, you will receive your results only by: Marland Kitchen MyChart Message (if you have MyChart) OR . A paper copy in the mail If you have any lab test that is abnormal or we need to change your treatment, we will call you to review the results.  Follow-Up: At [facility], you and your health needs are our priority.  As part of our continuing mission to provide you with exceptional heart care, we have created designated Provider Care Teams.  These Care Teams include your primary Cardiologist (physician) and Advanced Practice Providers (APPs -  Physician Assistants and Nurse Practitioners) who all work together to provide you with the care you need, when you need it.  Your next appointment:   12 months  The format for your next appointment:   In Person  Provider:   You may see Dr. Debara Pickett or one of the following Advanced Practice Providers on your designated Care Team:    Almyra Deforest, PA-C  Fabian Sharp, [place] or   Roby Lofts, [place]   Other Instructions

## 2019-06-04 DIAGNOSIS — I10 Essential (primary) hypertension: Secondary | ICD-10-CM | POA: Diagnosis not present

## 2019-06-04 DIAGNOSIS — E7849 Other hyperlipidemia: Secondary | ICD-10-CM | POA: Diagnosis not present

## 2019-06-26 ENCOUNTER — Ambulatory Visit
Admission: EM | Admit: 2019-06-26 | Discharge: 2019-06-26 | Disposition: A | Discharge status: Home / Self Care | Payer: Medicare Other | Attending: Emergency Medicine | Admitting: Emergency Medicine

## 2019-06-26 ENCOUNTER — Other Ambulatory Visit: Payer: Self-pay

## 2019-06-26 DIAGNOSIS — R05 Cough: Secondary | ICD-10-CM | POA: Diagnosis not present

## 2019-06-26 DIAGNOSIS — Z20828 Contact with and (suspected) exposure to other viral communicable diseases: Secondary | ICD-10-CM | POA: Diagnosis not present

## 2019-06-26 DIAGNOSIS — J4521 Mild intermittent asthma with (acute) exacerbation: Secondary | ICD-10-CM | POA: Diagnosis not present

## 2019-06-26 DIAGNOSIS — R062 Wheezing: Secondary | ICD-10-CM

## 2019-06-26 DIAGNOSIS — Z20822 Contact with and (suspected) exposure to covid-19: Secondary | ICD-10-CM

## 2019-06-26 MED ORDER — ALBUTEROL SULFATE HFA 108 (90 BASE) MCG/ACT IN AERS: 1.0000 | INHALATION_SPRAY | Freq: Four times a day (QID) | RESPIRATORY_TRACT | 0 refills | Status: DC | PRN

## 2019-06-26 MED ORDER — METHYLPREDNISOLONE SODIUM SUCC 125 MG IJ SOLR
125.0000 mg | Freq: Once | INTRAMUSCULAR | Status: AC
  Administered 2019-06-26: 125 mg via INTRAMUSCULAR

## 2019-06-26 MED ORDER — BENZONATATE 100 MG PO CAPS: 100.0000 mg | ORAL_CAPSULE | Freq: Three times a day (TID) | ORAL | 0 refills | Status: DC

## 2019-06-26 MED ORDER — PREDNISONE 20 MG PO TABS: 20.0000 mg | ORAL_TABLET | Freq: Two times a day (BID) | ORAL | 0 refills | Status: AC

## 2019-06-26 NOTE — ED Triage Notes (Signed)
Pt presents with cough for past  Weeks

## 2019-06-26 NOTE — Discharge Instructions (Signed)
Although I have low suspicion for COVID, lets test to be sure COVID testing ordered.  It will take between 5-7 days for test results.  Someone will contact you regarding abnormal results.    In the meantime: You should remain isolated in your home for 10 days from symptom onset AND greater than 72 hours after symptoms resolution (absence of fever without the use of fever-reducing medication and improvement in respiratory symptoms), whichever is longer Get plenty of rest and push fluids Based on symptoms and exam will treat for possible asthma flare Steroid shot given in office Prednisone prescribed Albuterol inhaler as needed for shortness of breath and /or wheezinging Tessalon Perles prescribed for cough Use OTC zyrtec for nasal congestion, runny nose, and/or sore throat Use OTC flonase for nasal congestion and runny nose Use medications daily for symptom relief Use OTC medications like ibuprofen or tylenol as needed fever or pain Follow up with PCP in 1-2 days via video visit or e-visit for recheck  Call or go to the ED if you have any new or worsening symptoms such as fever, worsening cough, shortness of breath, chest tightness, chest pain, turning blue, changes in mental status, etc..Marland Kitchen

## 2019-06-26 NOTE — ED Provider Notes (Signed)
Jane Wilkerson   [id number] 06/26/19 Arrival Time: 1227   CC: COVID symptoms  SUBJECTIVE: History from: patient.  Jane Wilkerson is a 78 y.o. female hx significant for asthma, who presents with persistent productive cough with green sputum x 3 weeks.  Denies sick exposure to COVID, flu or strep.  Denies recent travel.  Has tried OTC medications without relief.  Symptoms are made worse with laying down at night.  Reports previous symptoms in the past related to bronchitis.   Complains of associated wheezing.  Denies fever, chills, fatigue, sinus pain, rhinorrhea, sore throat, SOB, chest pain, nausea, changes in bowel or bladder habits.     ROS: As per HPI.  All other pertinent ROS negative.     Past Medical History:  Diagnosis Date  . Anxiety 07/07/2011   takes Ativan every 4hrs  . Arthritis    back   . Asthma    uses inhaler prn  . Breast cancer ([place])    right - local excision, chemotheraphy  . Cataract    right eye  . Chronic back pain   . Depression    has appt to discuss depression 07/26/2011  . Diverticulosis   . Dizziness    occasionally  . Gastric ulcer   . GERD (gastroesophageal reflux disease)    takes Nexium daily  . Hemorrhoids   . History of nuclear stress test 11/2010   lexiscan; no evidence of inducible ischemia; normal pattern of perfusion   . History of shingles 2011  . Hx of colonic polyps   . Hyperlipidemia    takes Zocor daily  . Hypertension    takes Bystolic and Losartan daily  . Hypothyroidism    takes Synthroid daily  . Nausea alone    onset last Wednesday  08/04/11  . Nocturia   . Osteoporosis   . PONV (postoperative nausea and vomiting)   . PVC's (premature ventricular contractions)   . Shortness of breath    with exertion  . Urinary frequency    Past Surgical History:  Procedure Laterality Date  . ABDOMINAL HYSTERECTOMY  2001  . BREAST LUMPECTOMY Right 07/02/11   right breast and SNL  . BREAST SURGERY Left 1972   left  lumpectomy  . BREAST SURGERY Right 08/11/11   margins on rt breast  . CATARACT EXTRACTION W/PHACO Right 08/31/2016   Procedure: CATARACT EXTRACTION PHACO AND INTRAOCULAR LENS PLACEMENT ([place]);  Surgeon: Rutherford Guys, MD;  Location: [facility];  Service: Ophthalmology;  Laterality: Right;  CDE: 8.46  . CATARACT EXTRACTION W/PHACO Left 09/14/2016   Procedure: CATARACT EXTRACTION PHACO AND INTRAOCULAR LENS PLACEMENT ([facility]);  Surgeon: Rutherford Guys, MD;  Location: [facility];  Service: Ophthalmology;  Laterality: Left;  CDE: 9.32  . colonscopy    . HEMORRHOID SURGERY    . TRANSTHORACIC ECHOCARDIOGRAM  2009   normal LV & RV systolic function; mild mitral annular calcif & mild MR; trace TR   Allergies  Allergen Reactions  . Hydrocodone Other (See Comments)    Reaction unknown  . Oxycodone Hcl Itching  . Ziac [Bisoprolol-Hydrochlorothiazide] Other (See Comments)    Reaction unknown  . Penicillins Rash    Has patient had a PCN reaction causing immediate rash, facial/tongue/throat swelling, SOB or lightheadedness with hypotension: unknown Has patient had a PCN reaction causing severe rash involving mucus membranes or skin necrosis: unknown Has patient had a PCN reaction that required hospitalization No Has patient had a PCN reaction occurring within the last 10 years: No If  all of the above answers are "NO", then may proceed with Cephalosporin use.    No current facility-administered medications on file prior to encounter.   Current Outpatient Medications on File Prior to Encounter  Medication Sig Dispense Refill  . alendronate (FOSAMAX) 70 MG tablet Take 1 tablet by mouth once a week. Wednesday    . amLODipine (NORVASC) 5 MG tablet Take 5 mg by mouth daily.    . Calcium Carbonate (CALTRATE 600 PO) Take 600 mg by mouth daily.     . citalopram (CELEXA) 20 MG tablet Take 20 mg by mouth daily.    . fish oil-omega-3 fatty acids 1000 MG capsule Take 1 g by mouth daily.     . hydrocortisone cream 0.5 % Apply 1  application topically 2 (two) times daily as needed for itching. For hemorrhoid pain    . levothyroxine (SYNTHROID, LEVOTHROID) 50 MCG tablet Take 50 mcg by mouth daily.    Marland Kitchen LORazepam (ATIVAN) 1 MG tablet Take 1 mg by mouth at bedtime. For anxiety    . losartan (COZAAR) 100 MG tablet Take 100 mg by mouth daily.     . nebivolol (BYSTOLIC) 10 MG tablet Take 1 tablet (10 mg total) by mouth daily. 30 tablet 6  . simvastatin (ZOCOR) 40 MG tablet Take 40 mg by mouth at bedtime.       Social History   Socioeconomic History  . Marital status: Married    Spouse name: Not on file  . Number of children: Not on file  . Years of education: 46   . Highest education level: Not on file  Occupational History  . Occupation: retired    Fish farm manager: RETIRED  Tobacco Use  . Smoking status: Never Smoker  . Smokeless tobacco: Never Used  Substance and Sexual Activity  . Alcohol use: No  . Drug use: No  . Sexual activity: Yes    Birth control/protection: Surgical  Other Topics Concern  . Not on file  Social History Narrative   Married with three children   [name]- age 61, Lattie Haw age 7 and [place] age 20- all reside in [place]   Social Determinants of Health   Financial Resource Strain:   . Difficulty of Paying Living Expenses: Not on file  Food Insecurity:   . Worried About Charity fundraiser in the Last Year: Not on file  . Ran Out of Food in the Last Year: Not on file  Transportation Needs:   . Lack of Transportation (Medical): Not on file  . Lack of Transportation (Non-Medical): Not on file  Physical Activity:   . Days of Exercise per Week: Not on file  . Minutes of Exercise per Session: Not on file  Stress:   . Feeling of Stress : Not on file  Social Connections:   . Frequency of Communication with Friends and Family: Not on file  . Frequency of Social Gatherings with Friends and Family: Not on file  . Attends Religious Services: Not on file  . Active Member of Clubs or Organizations:  Not on file  . Attends Archivist Meetings: Not on file  . Marital Status: Not on file  Intimate Partner Violence:   . Fear of Current or Ex-Partner: Not on file  . Emotionally Abused: Not on file  . Physically Abused: Not on file  . Sexually Abused: Not on file   Family History  Problem Relation Age of Onset  . Colon cancer Mother  also stroke  . Pancreatic cancer Father        also heart disease  . Diabetes Sister   . Hypertension Sister   . Anesthesia problems Neg Hx   . Hypotension Neg Hx   . Malignant hyperthermia Neg Hx   . Pseudochol deficiency Neg Hx     OBJECTIVE:  Vitals:   06/26/19 1238  BP: 130/78  Pulse: 85  Resp: (!) 22  Temp: 98.4 F (36.9 C)  SpO2: 90%    O2 sats improved to 97% on RA after examination  General appearance: alert; appears fatigued, but nontoxic; speaking in full sentences and tolerating own secretions HEENT: NCAT; Ears: EACs clear, TMs pearly gray; Eyes: PERRL.  EOM grossly intact. Nose: nares patent without rhinorrhea, Throat: oropharynx clear, tonsils non erythematous or enlarged, uvula midline  Neck: supple without LAD Lungs: unlabored respirations, symmetrical air entry; cough: moderate; no respiratory distress; expiratory wheezes heard throughout bilateral lung fields Heart: regular rate and rhythm.   Skin: warm and dry Psychological: alert and cooperative; normal mood and affect  ASSESSMENT & PLAN:  1. Suspected COVID-19 virus infection   2. Mild intermittent asthma with exacerbation     Meds ordered this encounter  Medications  . methylPREDNISolone sodium succinate (SOLU-MEDROL) 125 mg/2 mL injection 125 mg  . predniSONE (DELTASONE) 20 MG tablet    Sig: Take 1 tablet (20 mg total) by mouth 2 (two) times daily with a meal for 5 days.    Dispense:  10 tablet    Refill:  0    Order Specific Question:   Supervising Provider    Answer:   Raylene Everts [id number]  . albuterol (VENTOLIN HFA) 108 (90 Base)  MCG/ACT inhaler    Sig: Inhale 1-2 puffs into the lungs every 6 (six) hours as needed for wheezing or shortness of breath.    Dispense:  18 g    Refill:  0    Order Specific Question:   Supervising Provider    Answer:   Raylene Everts [id number]  . benzonatate (TESSALON) 100 MG capsule    Sig: Take 1 capsule (100 mg total) by mouth every 8 (eight) hours.    Dispense:  21 capsule    Refill:  0    Order Specific Question:   Supervising Provider    Answer:   Raylene Everts [id number]     Although I have low suspicion for COVID, lets test to be sure COVID testing ordered.  It will take between 5-7 days for test results.  Someone will contact you regarding abnormal results.    In the meantime: You should remain isolated in your home for 10 days from symptom onset AND greater than 72 hours after symptoms resolution (absence of fever without the use of fever-reducing medication and improvement in respiratory symptoms), whichever is longer Get plenty of rest and push fluids Based on symptoms and exam will treat for possible asthma flare Steroid shot given in office Prednisone prescribed Albuterol inhaler as needed for shortness of breath and /or wheezinging Tessalon Perles prescribed for cough Use OTC zyrtec for nasal congestion, runny nose, and/or sore throat Use OTC flonase for nasal congestion and runny nose Use medications daily for symptom relief Use OTC medications like ibuprofen or tylenol as needed fever or pain Follow up with PCP in 1-2 days via video visit or e-visit for recheck  Call or go to the ED if you have any new or worsening symptoms such as fever, worsening cough,  shortness of breath, chest tightness, chest pain, turning blue, changes in mental status, etc...   Reviewed expectations re: course of current medical issues. Questions answered. Outlined signs and symptoms indicating need for more acute intervention. Patient verbalized understanding. After Visit Summary  given.         Lestine Box, PA-C 06/26/19 1415

## 2019-06-28 LAB — NOVEL CORONAVIRUS, NAA: SARS-CoV-2, NAA: NOT DETECTED

## 2019-07-10 DIAGNOSIS — Z6828 Body mass index (BMI) 28.0-28.9, adult: Secondary | ICD-10-CM | POA: Diagnosis not present

## 2019-07-10 DIAGNOSIS — E663 Overweight: Secondary | ICD-10-CM | POA: Diagnosis not present

## 2019-07-10 DIAGNOSIS — J22 Unspecified acute lower respiratory infection: Secondary | ICD-10-CM | POA: Diagnosis not present

## 2019-07-12 DIAGNOSIS — H26492 Other secondary cataract, left eye: Secondary | ICD-10-CM | POA: Diagnosis not present

## 2019-07-12 DIAGNOSIS — Z961 Presence of intraocular lens: Secondary | ICD-10-CM | POA: Diagnosis not present

## 2019-07-12 DIAGNOSIS — Z23 Encounter for immunization: Secondary | ICD-10-CM | POA: Diagnosis not present

## 2019-08-01 DIAGNOSIS — H26492 Other secondary cataract, left eye: Secondary | ICD-10-CM | POA: Diagnosis not present

## 2019-08-14 DIAGNOSIS — Z6828 Body mass index (BMI) 28.0-28.9, adult: Secondary | ICD-10-CM | POA: Diagnosis not present

## 2019-08-14 DIAGNOSIS — E039 Hypothyroidism, unspecified: Secondary | ICD-10-CM | POA: Diagnosis not present

## 2019-08-14 DIAGNOSIS — Z1389 Encounter for screening for other disorder: Secondary | ICD-10-CM | POA: Diagnosis not present

## 2019-08-14 DIAGNOSIS — E663 Overweight: Secondary | ICD-10-CM | POA: Diagnosis not present

## 2019-08-14 DIAGNOSIS — R7309 Other abnormal glucose: Secondary | ICD-10-CM | POA: Diagnosis not present

## 2019-08-14 DIAGNOSIS — E782 Mixed hyperlipidemia: Secondary | ICD-10-CM | POA: Diagnosis not present

## 2019-08-14 DIAGNOSIS — Z0000 Encounter for general adult medical examination without abnormal findings: Secondary | ICD-10-CM | POA: Diagnosis not present

## 2019-08-14 DIAGNOSIS — Z0001 Encounter for general adult medical examination with abnormal findings: Secondary | ICD-10-CM | POA: Diagnosis not present

## 2019-08-14 DIAGNOSIS — L309 Dermatitis, unspecified: Secondary | ICD-10-CM | POA: Diagnosis not present

## 2019-08-16 DIAGNOSIS — Z23 Encounter for immunization: Secondary | ICD-10-CM | POA: Diagnosis not present

## 2019-09-14 DIAGNOSIS — Z23 Encounter for immunization: Secondary | ICD-10-CM | POA: Diagnosis not present

## 2019-10-02 DIAGNOSIS — R1310 Dysphagia, unspecified: Secondary | ICD-10-CM | POA: Diagnosis not present

## 2019-10-02 DIAGNOSIS — E663 Overweight: Secondary | ICD-10-CM | POA: Diagnosis not present

## 2019-10-02 DIAGNOSIS — R0602 Shortness of breath: Secondary | ICD-10-CM | POA: Diagnosis not present

## 2019-10-02 DIAGNOSIS — Z6828 Body mass index (BMI) 28.0-28.9, adult: Secondary | ICD-10-CM | POA: Diagnosis not present

## 2019-10-03 DIAGNOSIS — E7849 Other hyperlipidemia: Secondary | ICD-10-CM | POA: Diagnosis not present

## 2019-10-03 DIAGNOSIS — E063 Autoimmune thyroiditis: Secondary | ICD-10-CM | POA: Diagnosis not present

## 2019-10-03 DIAGNOSIS — I10 Essential (primary) hypertension: Secondary | ICD-10-CM | POA: Diagnosis not present

## 2019-10-22 ENCOUNTER — Other Ambulatory Visit (HOSPITAL_COMMUNITY): Payer: Self-pay | Admitting: Physician Assistant

## 2019-10-22 DIAGNOSIS — Z1231 Encounter for screening mammogram for malignant neoplasm of breast: Secondary | ICD-10-CM

## 2019-11-13 ENCOUNTER — Institutional Professional Consult (permissible substitution): Payer: Medicare Other | Admitting: Internal Medicine

## 2019-11-19 ENCOUNTER — Ambulatory Visit (HOSPITAL_COMMUNITY)
Admission: RE | Admit: 2019-11-19 | Discharge: 2019-11-19 | Disposition: A | Discharge status: Home / Self Care | Payer: Medicare Other | Source: Ambulatory Visit | Attending: Internal Medicine | Admitting: Internal Medicine

## 2019-11-19 ENCOUNTER — Ambulatory Visit (HOSPITAL_COMMUNITY)
Admission: RE | Admit: 2019-11-19 | Discharge: 2019-11-19 | Disposition: A | Discharge status: Home / Self Care | Payer: Medicare Other | Source: Ambulatory Visit | Attending: Physician Assistant | Admitting: Physician Assistant

## 2019-11-19 ENCOUNTER — Other Ambulatory Visit: Payer: Self-pay

## 2019-11-19 ENCOUNTER — Ambulatory Visit (INDEPENDENT_AMBULATORY_CARE_PROVIDER_SITE_OTHER): Payer: Medicare Other | Admitting: Internal Medicine

## 2019-11-19 ENCOUNTER — Encounter: Payer: Self-pay | Admitting: Internal Medicine

## 2019-11-19 DIAGNOSIS — R0609 Other forms of dyspnea: Secondary | ICD-10-CM

## 2019-11-19 DIAGNOSIS — R058 Other specified cough: Secondary | ICD-10-CM | POA: Insufficient documentation

## 2019-11-19 DIAGNOSIS — Z1231 Encounter for screening mammogram for malignant neoplasm of breast: Secondary | ICD-10-CM | POA: Insufficient documentation

## 2019-11-19 DIAGNOSIS — R05 Cough: Secondary | ICD-10-CM | POA: Diagnosis not present

## 2019-11-19 DIAGNOSIS — R0600 Dyspnea, unspecified: Secondary | ICD-10-CM | POA: Diagnosis not present

## 2019-11-19 NOTE — Patient Instructions (Addendum)
Stop fosfamax for the next 3 months and return to see me if you are not happy with your heartburn or respiratory symptoms   Also stop the fish oil  GERD (REFLUX)  is an extremely common cause of respiratory symptoms just like yours , many times with no obvious heartburn at all.    It can be treated with medication, but also with lifestyle changes including elevation of the head of your bed (ideally with 6 -8inch blocks under the headboard of your bed),  Smoking cessation, avoidance of late meals, excessive alcohol, and avoid fatty foods, chocolate, peppermint, colas, red wine, and acidic juices such as orange juice.  NO MINT OR MENTHOL PRODUCTS SO NO COUGH DROPS  USE SUGARLESS CANDY INSTEAD (Jolley ranchers or Stover's or Life Savers) or even ice chips will also do - the key is to swallow to prevent all throat clearing. NO OIL BASED VITAMINS - use powdered substitutes.  Avoid fish oil when coughing.  Please remember to go to the  x-ray department  for your tests - we will call you with the results when they are available

## 2019-11-19 NOTE — Progress Notes (Addendum)
Jane Wilkerson, female    DOB: 06/18/1941,    MRN: [id number]   Brief patient profile:  73 yowf never smoker with lots of sinus problems growing up with smoker and married to one until 1960's sinuses seemed a lot better and fine with IUP's last one about 1970 and fine until around 2018 with severe cough > rec albuterol/prednisone/dulera  and found that she's better off than on so referred to pulmonary clinic 11/19/2019 by Dr   Collene Mares       History of Present Illness  11/19/2019  Pulmonary/ 1st office eval/Amaani Guilbault  Chief Complaint  Patient presents with  . Pulmonary Consult    Referred by Dr. Collene Mares.  Pt c/o cough since September 2020.   Dyspnea:  Not limited by sob but by fatigue Cough: none now  Sleep: fine bed flat 3 pillows / has bad reflux  Since on fosfamax and not nexium SABA use: not using    No obvious day to day or daytime variability or assoc excess/ purulent sputum or mucus plugs or hemoptysis or cp or chest tightness, subjective wheeze or overt sinus   symptoms.    Also denies any obvious fluctuation of symptoms with weather or environmental changes or other aggravating or alleviating factors except as outlined above   No unusual exposure hx or h/o childhood pna/ asthma or knowledge of premature birth.  Current Allergies, Complete Past Medical History, Past Surgical History, Family History, and Social History were reviewed in [employer] record.  ROS  The following are not active complaints unless bolded Hoarseness, sore throat, dysphagia, dental problems, itching, sneezing,  nasal congestion or discharge of excess mucus or purulent secretions, ear ache,   fever, chills, sweats, unintended wt loss or wt gain, classically pleuritic or exertional cp,  orthopnea pnd or arm/hand swelling  or leg swelling, presyncope, palpitations, abdominal pain, anorexia, nausea, vomiting, diarrhea  or change in bowel habits or change in bladder habits, change in stools  or change in urine, dysuria, hematuria,  rash, arthralgias, visual complaints, headache, numbness, weakness or ataxia or problems with walking or coordination,  change in mood or  memory.             Past Medical History:  Diagnosis Date  . Anxiety 07/07/2011   takes Ativan every 4hrs  . Arthritis    back   . Asthma    uses inhaler prn  . Breast cancer ([place])    right - local excision, chemotheraphy  . Cataract    right eye  . Chronic back pain   . Depression    has appt to discuss depression 07/26/2011  . Diverticulosis   . Dizziness    occasionally  . Gastric ulcer   . GERD (gastroesophageal reflux disease)    takes Nexium daily  . Hemorrhoids   . History of nuclear stress test 11/2010   lexiscan; no evidence of inducible ischemia; normal pattern of perfusion   . History of shingles 2011  . Hx of colonic polyps   . Hyperlipidemia    takes Zocor daily  . Hypertension    takes Bystolic and Losartan daily  . Hypothyroidism    takes Synthroid daily  . Nausea alone    onset last Wednesday  08/04/11  . Nocturia   . Osteoporosis   . PONV (postoperative nausea and vomiting)   . PVC's (premature ventricular contractions)   . Shortness of breath    with exertion  . Urinary frequency  Outpatient Medications Prior to Visit  Medication Sig Dispense Refill  . alendronate (FOSAMAX) 70 MG tablet Take 1 tablet by mouth once a week. Wednesday    . amLODipine (NORVASC) 5 MG tablet Take 5 mg by mouth daily.    . benzonatate (TESSALON) 100 MG capsule Take 1 capsule (100 mg total) by mouth every 8 (eight) hours. 21 capsule 0  . Calcium Carbonate (CALTRATE 600 PO) Take 600 mg by mouth daily.     . citalopram (CELEXA) 20 MG tablet Take 20 mg by mouth daily.    . fish oil-omega-3 fatty acids 1000 MG capsule Take 1 g by mouth daily.     . hydrocortisone cream 0.5 % Apply 1 application topically 2 (two) times daily as needed for itching. For hemorrhoid pain    . levothyroxine  (SYNTHROID, LEVOTHROID) 50 MCG tablet Take 50 mcg by mouth daily.    Marland Kitchen LORazepam (ATIVAN) 1 MG tablet Take 1 mg by mouth at bedtime. For anxiety    . losartan (COZAAR) 100 MG tablet Take 100 mg by mouth daily.     . mometasone-formoterol (DULERA) 200-5 MCG/ACT AERO  not using     . nebivolol (BYSTOLIC) 10 MG tablet Take 1 tablet (10 mg total) by mouth daily. 30 tablet 6  . simvastatin (ZOCOR) 40 MG tablet Take 40 mg by mouth at bedtime.      Marland Kitchen albuterol (VENTOLIN HFA) 108 (90 Base) MCG/ACT inhaler Inhale 1-2 puffs into the lungs every 6 (six) hours as needed for wheezing or shortness of breath. 18 g 0   No facility-administered medications prior to visit.     Objective:     BP 132/82 (BP Location: Left Arm, Cuff Size: Normal)   Pulse 87   Temp (!) 97.1 F (36.2 C) (Temporal)   Ht 5\' 2"  (1.575 m)   Wt 157 lb (71.2 kg)   SpO2 97% Comment: on RA  BMI 28.72 kg/m   SpO2: 97 %(on RA)   Anxious but very pleasant amb wf nad   HEENT : pt wearing mask not removed for exam due to covid -19 concerns.    NECK :  without JVD/Nodes/TM/ nl carotid upstrokes bilaterally   LUNGS: no acc muscle use,  Nl contour chest which is clear to A and P bilaterally without cough on insp or exp maneuvers   CV:  RRR  no s3 or murmur or increase in P2, and no edema   ABD:  soft and nontender with nl inspiratory excursion in the supine position. No bruits or organomegaly appreciated, bowel sounds nl  MS:  Nl gait/ ext warm without deformities, calf tenderness, cyanosis or clubbing No obvious joint restrictions   SKIN: warm and dry without lesions    NEURO:  alert, approp, nl sensorium with  no motor or cerebellar deficits apparent.    CXR PA and Lateral:   11/19/2019 :    I personally reviewed images and agree with radiology impression as follows:   1. No acute cardiopulmonary abnormality. Plus my impression Significant eventration R HD anteriorly         Assessment   DOE (dyspnea on  exertion) Symptoms are markedly disproportionate to objective findings and not clear to what extent this is actually a pulmonary  problem but pt does appear to have difficult to sort out respiratory symptoms of unknown origin for which  DDX  = almost all start with A and  include Adherence, Ace Inhibitors, Acid Reflux, Active Sinus Disease, Alpha 1  Antitripsin deficiency, Anxiety masquerading as Airways dz,  ABPA,  Allergy(esp in young), Aspiration (esp in elderly), Adverse effects of meds,  Active smoking or Vaping, A bunch of PE's/clot burden (a few small clots can't cause this syndrome unless there is already severe underlying pulm or vascular dz with poor reserve),  Anemia or thyroid disorder, plus two Bs  = Bronchiectasis and Beta blocker use..and one C= CHF     Adherence is always the initial "prime suspect" and is a multilayered concern that requires a "trust but verify" approach in every patient - starting with knowing how to use medications, especially inhalers, correctly, keeping up with refills and understanding the fundamental difference between maintenance and prns vs those medications only taken for a very short course and then stopped and not refilled.   ? Acid (or non-acid) GERD > always difficult to exclude as up to 75% of pts in some series report no assoc GI/ Heartburn symptoms> rec first stop fosfamax and fish oil >>    diet restrictions/ reviewed and instructions given in writing> add ppi back next if not improving   ? Allergies / asthma > note absence of response to albuterol, dulera/ pred strongly rules against    ? Anxiety > usually at the bottom of this list of usual suspects but may complicate interpretation of response or lack thereof to symptom management which can be quite subjective.   Adverse effects of meds ? fosfamax started prior to onset of atypical cough pattern? > see UACS a/p           Upper airway cough syndrome Dg Es 10/29/16 Mildly distended esophagus  without evidence of mass or stricture. Diffuse esophageal dysmotility.   >>>  Trial off fosfamax/ fish oil 11/19/2019   Upper airway cough syndrome (previously labeled PNDS),  is so named because it's frequently impossible to sort out how much is  CR/sinusitis with freq throat clearing (which can be related to primary GERD)   vs  causing  secondary (" extra esophageal")  GERD from wide swings in gastric pressure that occur with throat clearing, often  promoting self use of mint and menthol lozenges that reduce the lower esophageal sphincter tone and exacerbate the problem further in a cyclical fashion.   These are the same pts (now being labeled as having "irritable larynx syndrome" by some cough centers) who not infrequently have a history of having failed to tolerate ace inhibitors,  dry powder inhalers or biphosphonates or report having atypical/extraesophageal reflux symptoms that don't respond to standard doses of PPI  and are easily confused as having aecopd or asthma flares by even experienced allergists/ pulmonologists (myself included).    First step is therefore try off biphonsponates and fish oil x 3 months and regroup here if cough or breathing problems recur - if all better can return to PCP for alternatives to fosfamax (eg prolia or Reclast).         Each maintenance medication was reviewed in detail including emphasizing most importantly the difference between maintenance and prns and under what circumstances the prns are to be triggered using an action plan format where appropriate.  Total time for H and P, chart review, counseling,  and generating customized AVS unique to this office visit / charting = 60 min       Outpatient Encounter Medications as of 11/19/2019  Medication Sig  . amLODipine (NORVASC) 5 MG tablet Take 5 mg by mouth daily.  . benzonatate (TESSALON) 100 MG capsule Take 1  capsule (100 mg total) by mouth every 8 (eight) hours.  . Calcium Carbonate (CALTRATE 600  PO) Take 600 mg by mouth daily.   . citalopram (CELEXA) 20 MG tablet Take 20 mg by mouth daily.  . hydrocortisone cream 0.5 % Apply 1 application topically 2 (two) times daily as needed for itching. For hemorrhoid pain  . levothyroxine (SYNTHROID, LEVOTHROID) 50 MCG tablet Take 50 mcg by mouth daily.  Marland Kitchen LORazepam (ATIVAN) 1 MG tablet Take 1 mg by mouth at bedtime. For anxiety  . losartan (COZAAR) 100 MG tablet Take 100 mg by mouth daily.   . nebivolol (BYSTOLIC) 10 MG tablet Take 1 tablet (10 mg total) by mouth daily.  . simvastatin (ZOCOR) 40 MG tablet Take 40 mg by mouth at bedtime.        Christinia Gully, MD 11/19/2019

## 2019-11-19 NOTE — Assessment & Plan Note (Addendum)
Symptoms are markedly disproportionate to objective findings and not clear to what extent this is actually a pulmonary  problem but pt does appear to have difficult to sort out respiratory symptoms of unknown origin for which  DDX  = almost all start with A and  include Adherence, Ace Inhibitors, Acid Reflux, Active Sinus Disease, Alpha 1 Antitripsin deficiency, Anxiety masquerading as Airways dz,  ABPA,  Allergy(esp in young), Aspiration (esp in elderly), Adverse effects of meds,  Active smoking or Vaping, A bunch of PE's/clot burden (a few small clots can't cause this syndrome unless there is already severe underlying pulm or vascular dz with poor reserve),  Anemia or thyroid disorder, plus two Bs  = Bronchiectasis and Beta blocker use..and one C= CHF     Adherence is always the initial "prime suspect" and is a multilayered concern that requires a "trust but verify" approach in every patient - starting with knowing how to use medications, especially inhalers, correctly, keeping up with refills and understanding the fundamental difference between maintenance and prns vs those medications only taken for a very short course and then stopped and not refilled.   ? Acid (or non-acid) GERD > always difficult to exclude as up to 75% of pts in some series report no assoc GI/ Heartburn symptoms> rec first stop fosfamax and fish oil >>    diet restrictions/ reviewed and instructions given in writing> add ppi back next if not improving   ? Allergies / asthma > note absence of response to albuterol, dulera/ pred strongly rules against    ? Anxiety > usually at the bottom of this list of usual suspects but may complicate interpretation of response or lack thereof to symptom management which can be quite subjective.   Adverse effects of meds ? fosfamax started prior to onset of atypical cough pattern? > see [facility] a/p

## 2019-11-19 NOTE — Assessment & Plan Note (Signed)
Dg Es 10/29/16 Mildly distended esophagus without evidence of mass or stricture. Diffuse esophageal dysmotility.   >>>  Trial off fosfamax/ fish oil 11/19/2019   Upper airway cough syndrome (previously labeled PNDS),  is so named because it's frequently impossible to sort out how much is  CR/sinusitis with freq throat clearing (which can be related to primary GERD)   vs  causing  secondary (" extra esophageal")  GERD from wide swings in gastric pressure that occur with throat clearing, often  promoting self use of mint and menthol lozenges that reduce the lower esophageal sphincter tone and exacerbate the problem further in a cyclical fashion.   These are the same pts (now being labeled as having "irritable larynx syndrome" by some cough centers) who not infrequently have a history of having failed to tolerate ace inhibitors,  dry powder inhalers or biphosphonates or report having atypical/extraesophageal reflux symptoms that don't respond to standard doses of PPI  and are easily confused as having aecopd or asthma flares by even experienced allergists/ pulmonologists (myself included).    First step is therefore try off biphonsponates and fish oil x 3 months and regroup here if cough or breathing problems recur - if all better can return to PCP for alternatives to fosfamax (eg prolia or Reclast).         Each maintenance medication was reviewed in detail including emphasizing most importantly the difference between maintenance and prns and under what circumstances the prns are to be triggered using an action plan format where appropriate.  Total time for H and P, chart review, counseling,  and generating customized AVS unique to this office visit / charting = 60 min

## 2019-11-20 NOTE — Progress Notes (Signed)
Spoke with pt and notified of results per Dr. Wert. Pt verbalized understanding and denied any questions. 

## 2019-11-21 ENCOUNTER — Ambulatory Visit (HOSPITAL_COMMUNITY): Payer: Medicare Other

## 2019-11-27 DIAGNOSIS — N342 Other urethritis: Secondary | ICD-10-CM | POA: Diagnosis not present

## 2019-11-27 DIAGNOSIS — E663 Overweight: Secondary | ICD-10-CM | POA: Diagnosis not present

## 2019-11-27 DIAGNOSIS — Z6828 Body mass index (BMI) 28.0-28.9, adult: Secondary | ICD-10-CM | POA: Diagnosis not present

## 2019-12-06 ENCOUNTER — Other Ambulatory Visit: Payer: Self-pay

## 2019-12-06 ENCOUNTER — Inpatient Hospital Stay: Payer: Medicare Other | Attending: Adult Health | Admitting: Adult Health

## 2019-12-06 VITALS — BP 134/90 | HR 80 | Temp 99.1°F | Resp 17 | Ht 62.0 in | Wt 157.4 lb

## 2019-12-06 DIAGNOSIS — Z9223 Personal history of estrogen therapy: Secondary | ICD-10-CM | POA: Diagnosis not present

## 2019-12-06 DIAGNOSIS — C50411 Malignant neoplasm of upper-outer quadrant of right female breast: Secondary | ICD-10-CM

## 2019-12-06 DIAGNOSIS — Z923 Personal history of irradiation: Secondary | ICD-10-CM | POA: Insufficient documentation

## 2019-12-06 DIAGNOSIS — Z170 Estrogen receptor positive status [ER+]: Secondary | ICD-10-CM | POA: Insufficient documentation

## 2019-12-06 DIAGNOSIS — Z853 Personal history of malignant neoplasm of breast: Secondary | ICD-10-CM | POA: Diagnosis not present

## 2019-12-06 NOTE — Progress Notes (Signed)
SURVIVORSHIP VISIT:     REASON FOR VISIT:  Routine follow-up for history of breast cancer.   BRIEF ONCOLOGIC HISTORY:  Oncology History  Malignant neoplasm of upper-outer quadrant of right female breast ([place])  07/02/2011 Surgery   Right lumpectomy: DCIS ER 100%, PR 0%   07/23/2011 - 08/20/2011 Radiation Therapy   Adjuvant XRT   11/19/2011 - 11/30/2016 Anti-estrogen oral therapy   Aromasin 25 mg daily X 1 month then changed to Arimidex 1 mg daily      INTERVAL HISTORY:  Jane Wilkerson presents to the [facility] today for routine follow-up for her history of breast cancer.  Overall, she reports feeling quite well.   Since her last visit, she underwent a bilateral breast screening mammogram with TOMO and CAD on 11/20/2019 that showed no mammographic evidence of malignancy and breast density category B.     REVIEW OF SYSTEMS:  Breast: Denies any new nodularity, masses, tenderness, nipple changes, or nipple discharge.    PAST MEDICAL/SURGICAL HISTORY:  Past Medical History:  Diagnosis Date  . Anxiety 07/07/2011   takes Ativan every 4hrs  . Arthritis    back   . Asthma    uses inhaler prn  . Breast cancer ([place])    right - local excision, chemotheraphy  . Cataract    right eye  . Chronic back pain   . Depression    has appt to discuss depression 07/26/2011  . Diverticulosis   . Dizziness    occasionally  . Gastric ulcer   . GERD (gastroesophageal reflux disease)    takes Nexium daily  . Hemorrhoids   . History of nuclear stress test 11/2010   lexiscan; no evidence of inducible ischemia; normal pattern of perfusion   . History of shingles 2011  . Hx of colonic polyps   . Hyperlipidemia    takes Zocor daily  . Hypertension    takes Bystolic and Losartan daily  . Hypothyroidism    takes Synthroid daily  . Nausea alone    onset last Wednesday  08/04/11  . Nocturia   . Osteoporosis   . PONV (postoperative nausea and vomiting)   . PVC's (premature ventricular  contractions)   . Shortness of breath    with exertion  . Urinary frequency    Past Surgical History:  Procedure Laterality Date  . ABDOMINAL HYSTERECTOMY  2001  . BREAST LUMPECTOMY Right 07/02/11   right breast and SNL  . BREAST SURGERY Left 1972   left lumpectomy  . BREAST SURGERY Right 08/11/11   margins on rt breast  . CATARACT EXTRACTION W/PHACO Right 08/31/2016   Procedure: CATARACT EXTRACTION PHACO AND INTRAOCULAR LENS PLACEMENT ([place]);  Surgeon: Rutherford Guys, MD;  Location: [facility];  Service: Ophthalmology;  Laterality: Right;  CDE: 8.46  . CATARACT EXTRACTION W/PHACO Left 09/14/2016   Procedure: CATARACT EXTRACTION PHACO AND INTRAOCULAR LENS PLACEMENT ([facility]);  Surgeon: Rutherford Guys, MD;  Location: [facility];  Service: Ophthalmology;  Laterality: Left;  CDE: 9.32  . colonscopy    . HEMORRHOID SURGERY    . TRANSTHORACIC ECHOCARDIOGRAM  2009   normal LV & RV systolic function; mild mitral annular calcif & mild MR; trace TR     ALLERGIES:  Allergies  Allergen Reactions  . Hydrocodone Other (See Comments)    Reaction unknown  . Oxycodone Hcl Itching  . Ziac [Bisoprolol-Hydrochlorothiazide] Other (See Comments)    Reaction unknown  . Penicillins Rash    Has patient had a PCN reaction causing immediate rash,  facial/tongue/throat swelling, SOB or lightheadedness with hypotension: unknown Has patient had a PCN reaction causing severe rash involving mucus membranes or skin necrosis: unknown Has patient had a PCN reaction that required hospitalization No Has patient had a PCN reaction occurring within the last 10 years: No If all of the above answers are "NO", then may proceed with Cephalosporin use.      CURRENT MEDICATIONS:  Outpatient Encounter Medications as of 12/06/2019  Medication Sig Note  . amLODipine (NORVASC) 5 MG tablet Take 5 mg by mouth daily.   . Calcium Carbonate (CALTRATE 600 PO) Take 600 mg by mouth daily.    . citalopram (CELEXA) 20 MG tablet Take 20 mg by mouth  daily. 08/18/2011: Pt taking 1/2 at night due to making sleepy  . hydrocortisone cream 0.5 % Apply 1 application topically 2 (two) times daily as needed for itching. For hemorrhoid pain   . levothyroxine (SYNTHROID, LEVOTHROID) 50 MCG tablet Take 50 mcg by mouth daily.   Marland Kitchen LORazepam (ATIVAN) 1 MG tablet Take 1 mg by mouth at bedtime. For anxiety   . losartan (COZAAR) 100 MG tablet Take 100 mg by mouth daily.    . nebivolol (BYSTOLIC) 10 MG tablet Take 1 tablet (10 mg total) by mouth daily.   . simvastatin (ZOCOR) 40 MG tablet Take 40 mg by mouth at bedtime.     . [DISCONTINUED] benzonatate (TESSALON) 100 MG capsule Take 1 capsule (100 mg total) by mouth every 8 (eight) hours. (Patient not taking: Reported on 12/06/2019)    No facility-administered encounter medications on file as of 12/06/2019.     ONCOLOGIC FAMILY HISTORY:  Family History  Problem Relation Age of Onset  . Colon cancer Mother        also stroke  . Pancreatic cancer Father        also heart disease  . Diabetes Sister   . Hypertension Sister   . Anesthesia problems Neg Hx   . Hypotension Neg Hx   . Malignant hyperthermia Neg Hx   . Pseudochol deficiency Neg Hx     GENETIC COUNSELING/TESTING: Not at this time  SOCIAL HISTORY:  Social History   Socioeconomic History  . Marital status: Married    Spouse name: Not on file  . Number of children: Not on file  . Years of education: 17   . Highest education level: Not on file  Occupational History  . Occupation: retired    Fish farm manager: RETIRED  Tobacco Use  . Smoking status: Never Smoker  . Smokeless tobacco: Never Used  Substance and Sexual Activity  . Alcohol use: No  . Drug use: No  . Sexual activity: Yes    Birth control/protection: Surgical  Other Topics Concern  . Not on file  Social History Narrative   Married with three children   Jane Wilkerson- age 59, Jane Wilkerson age 68 and [place] age 70- all reside in [place]   Social Determinants of Health   Financial  Resource Strain:   . Difficulty of Paying Living Expenses:   Food Insecurity:   . Worried About Charity fundraiser in the Last Year:   . Arboriculturist in the Last Year:   Transportation Needs:   . Film/video editor (Medical):   Marland Kitchen Lack of Transportation (Non-Medical):   Physical Activity:   . Days of Exercise per Week:   . Minutes of Exercise per Session:   Stress:   . Feeling of Stress :   Social Connections:   .  Frequency of Communication with Friends and Family:   . Frequency of Social Gatherings with Friends and Family:   . Attends Religious Services:   . Active Member of Clubs or Organizations:   . Attends Archivist Meetings:   Marland Kitchen Marital Status:   Intimate Partner Violence:   . Fear of Current or Ex-Partner:   . Emotionally Abused:   Marland Kitchen Physically Abused:   . Sexually Abused:       OBJECTIVE:  BP 134/90 (BP Location: Left Arm, Patient Position: Sitting)   Pulse 80   Temp 99.1 F (37.3 C) (Temporal)   Resp 17   Ht 5\' 2"  (1.575 m)   Wt 157 lb 6.4 oz (71.4 kg)   SpO2 98%   BMI 28.79 kg/m  GENERAL: Patient is a well appearing female in no acute distress HEENT:  Sclerae anicteric.  Oropharynx clear and moist. No ulcerations or evidence of oropharyngeal candidiasis. Neck is supple.  NODES:  No cervical, supraclavicular, or axillary lymphadenopathy palpated.  BREAST EXAM:  Deferred. LUNGS:  Clear to auscultation bilaterally.  No wheezes or rhonchi. HEART:  Regular rate and rhythm. No murmur appreciated. ABDOMEN:  Soft, nontender.  Positive, normoactive bowel sounds. No organomegaly palpated. MSK:  No focal spinal tenderness to palpation. Full range of motion bilaterally in the upper extremities. EXTREMITIES:  No peripheral edema.   SKIN:  Clear with no obvious rashes or skin changes. No nail dyscrasia. NEURO:  Nonfocal. Well oriented.  Appropriate affect.   LABORATORY DATA:  None for this visit   DIAGNOSTIC IMAGING:  Most recent mammogram:     EXAM: DIGITAL SCREENING BILATERAL MAMMOGRAM WITH TOMO AND CAD  COMPARISON:  Previous exam(s).  ACR Breast Density Category b: There are scattered areas of fibroglandular density.  FINDINGS: There are no findings suspicious for malignancy. Images were processed with CAD.  IMPRESSION: No mammographic evidence of malignancy. A result letter of this screening mammogram will be mailed directly to the patient.  RECOMMENDATION: Screening mammogram in one year. (Code:SM-B-01Y)  BI-RADS CATEGORY  1: Negative.   Electronically Signed   By: Marin Olp M.D.   On: 11/20/2019 08:36 ASSESSMENT AND PLAN:  Ms.. Kassebaum is a pleasant 79 y.o. female with history of Stage 0 right breast DCIS, ER+/PR+, diagnosed in 2012, treated with lumpectomy, adjuvant radiation therapy, and anti-estrogen therapy with Aromasin/Anastrozole x 5 years completed in 11/2016.  [facility] and routine follow-up.   1. History of breast cancer:  Ms. Bertin is currently clinically and radiographically without evidence of disease or recurrence of breast cancer.  She will return in one year for her next LTS f/u.  Her mammogram will be due again in 11/2020. I encouraged her to call me with any questions or concerns before her next visit at the cancer center, and I would be happy to see her sooner, if needed.    2. Bone health: Marland Kitchen  She was given education on specific food and activities to promote bone health.  3. Cancer screening:  Due to Ms. Kessen's history and her age, she should receive screening for skin cancers, colon cancer.  She is concerned because she is a few years overdue for her colon cancer screening.  She was referred to GI up in [place] and has not received a call from them.  She would like a referral to [place], which I will place for her today.  Otherwise, she was encouraged to follow-up with her PCP for appropriate  cancer screenings.   4. Health  maintenance and wellness promotion: Ms. Schamel was encouraged to consume 5-7 servings of fruits and vegetables per day. She was also encouraged to engage in moderate to vigorous exercise for 30 minutes per day most days of the week. She was instructed to limit her alcohol consumption and continue to abstain from tobacco use.      Follow up instructions:    -Return to cancer center in one year for LTS follow up  -Mammogram due in 11/2020   The patient was provided an opportunity to ask questions and all were answered. The patient agreed with the plan and demonstrated an understanding of the instructions.   Total encounter time: 20 minutes*   Gardenia Phlegm, NP [place] [phone]  *Total Encounter Time as defined by the Centers for Medicare and Medicaid Services includes, in addition to the face-to-face time of a patient visit (documented in the note above) non-face-to-face time: obtaining and reviewing outside history, ordering and reviewing medications, tests or procedures, care coordination (communications with other health care professionals or caregivers) and documentation in the medical record.   Note: PRIMARY CARE PROVIDER [employer], [place] [phone]

## 2019-12-06 NOTE — Patient Instructions (Signed)

## 2019-12-07 ENCOUNTER — Telehealth: Payer: Self-pay | Admitting: Adult Health

## 2019-12-07 NOTE — Telephone Encounter (Signed)
Scheduled appts per 6/3 los. Left voicemail with appt date and time.

## 2019-12-10 ENCOUNTER — Encounter: Payer: Self-pay | Admitting: Gastroenterology

## 2020-01-18 DIAGNOSIS — N1831 Chronic kidney disease, stage 3a: Secondary | ICD-10-CM | POA: Diagnosis not present

## 2020-01-18 DIAGNOSIS — I129 Hypertensive chronic kidney disease with stage 1 through stage 4 chronic kidney disease, or unspecified chronic kidney disease: Secondary | ICD-10-CM | POA: Diagnosis not present

## 2020-01-18 DIAGNOSIS — E211 Secondary hyperparathyroidism, not elsewhere classified: Secondary | ICD-10-CM | POA: Diagnosis not present

## 2020-01-21 ENCOUNTER — Other Ambulatory Visit: Payer: Self-pay

## 2020-01-23 ENCOUNTER — Ambulatory Visit (INDEPENDENT_AMBULATORY_CARE_PROVIDER_SITE_OTHER): Payer: Medicare Other | Admitting: Gastroenterology

## 2020-01-23 ENCOUNTER — Other Ambulatory Visit: Payer: Self-pay

## 2020-01-23 ENCOUNTER — Encounter: Payer: Self-pay | Admitting: Gastroenterology

## 2020-01-23 DIAGNOSIS — Z800 Family history of malignant neoplasm of digestive organs: Secondary | ICD-10-CM | POA: Diagnosis not present

## 2020-01-23 DIAGNOSIS — R1310 Dysphagia, unspecified: Secondary | ICD-10-CM

## 2020-01-23 DIAGNOSIS — R194 Change in bowel habit: Secondary | ICD-10-CM | POA: Diagnosis not present

## 2020-01-23 MED ORDER — SUPREP BOWEL PREP KIT 17.5-3.13-1.6 GM/177ML PO SOLN
1.0000 | ORAL | 0 refills | Status: DC
Start: 2020-01-23 — End: 2020-03-04

## 2020-01-23 NOTE — Patient Instructions (Addendum)
If you are age 78 or older, your body mass index should be between 23-30. Your Body mass index is 28.72 kg/m. If this is out of the aforementioned range listed, please consider follow up with your Primary Care Provider.  If you are age 31 or younger, your body mass index should be between 19-25. Your Body mass index is 28.72 kg/m. If this is out of the aformentioned range listed, please consider follow up with your Primary Care Provider.   You have been scheduled for an endoscopy and colonoscopy. Please follow the written instructions given to you at your visit today. Please pick up your prep supplies at the pharmacy within the next 1-3 days. If you use inhalers (even only as needed), please bring them with you on the day of your procedure.  Due to recent changes in healthcare laws, you may see the results of your imaging and laboratory studies on MyChart before your provider has had a [place] to review them.  We understand that in some cases there may be results that are confusing or concerning to you. Not all laboratory results come back in the same time frame and the provider may be waiting for multiple results in order to interpret others.  Please give [place] 48 hours in order for your provider to thoroughly review all the results before contacting the office for clarification of your results.   Please purchase the following medications over the counter and take as directed:  Please start taking citrucel (orange flavored) powder fiber supplement.  This may cause some bloating at first but that usually goes away. Begin with a small spoonful and work your way up to a large, heaping spoonful daily over a week.  You should take Pepcid at night everyday.  Thank you for entrusting me with your care and choosing [facility].  Dr Ardis Hughs

## 2020-01-23 NOTE — Progress Notes (Signed)
HPI: This is a very pleasant 79 year old woman   who was referred to me by Deanne Coffer*  to evaluate family history of colon cancer, alternating bowel habits, dysphagia.     She was previously a patient at [facility].  I was able to view an office note from about 3 years ago.  At that visit she reported that she was on Nexium once daily.  She did have solid food dysphagia.  They ordered a barium esophagram which suggested diffuse esophageal dysmotility.  There were no strictures or mass.  It was documented that these results were given to the patient.  Her mother was diagnosed with colon cancer in her 32s and eventually died from metastatic colon cancer.  She herself underwent a colonoscopy June 2011 with Dr. Laural Golden.  Not a single subcentimeter hyperplastic polyp  She is a pretty scattered historian and had to be redirected several times throughout the examination.  She has been bothered by alternating bowel habits for at least past 2 to 3 years.  She describes a grinding sensation which sounds like nausea.  She also has minor blood per rectum with wiping.  She tells me she can have a bowel movement only once every 3 days not infrequently.  She will have to push and strain quite often to move her bowels.  Her weight has been overall stable although it fluctuates.  She has had dysphagia in the past.  This may be getting worse recently.  Tells me it started this year sometime however review of [facility] office note from 3 years ago suggests that she had a dysphagia back that as well.  She has heartburn unless she takes antiacid medicine.  Previously she was on proton pump inhibitor however she says this is too expensive and instead takes Pepcid.  She takes this once or twice a day and as long she does take it it sounds like her heartburn is under fairly good control.  Review of systems: Pertinent positive and negative review of systems were noted in the above HPI section. All  other review negative.   Past Medical History:  Diagnosis Date  . Anxiety 07/07/2011   takes Ativan every 4hrs  . Arthritis    back   . Asthma    uses inhaler prn  . Breast cancer ([place])    right - local excision, chemotheraphy  . Cataract    right eye  . Chronic back pain   . Depression    has appt to discuss depression 07/26/2011  . Diverticulosis   . Dizziness    occasionally  . Gastric ulcer   . GERD (gastroesophageal reflux disease)    takes Nexium daily  . Hemorrhoids   . History of nuclear stress test 11/2010   lexiscan; no evidence of inducible ischemia; normal pattern of perfusion   . History of shingles 2011  . Hx of colonic polyps   . Hyperlipidemia    takes Zocor daily  . Hypertension    takes Bystolic and Losartan daily  . Hypothyroidism    takes Synthroid daily  . Nausea alone    onset last Wednesday  08/04/11  . Nocturia   . Osteoporosis   . PONV (postoperative nausea and vomiting)   . PVC's (premature ventricular contractions)   . Shortness of breath    with exertion  . Urinary frequency     Past Surgical History:  Procedure Laterality Date  . ABDOMINAL HYSTERECTOMY  2001  . BREAST LUMPECTOMY  Right 07/02/11   right breast and SNL  . BREAST SURGERY Left 1972   left lumpectomy  . BREAST SURGERY Right 08/11/11   margins on rt breast  . CATARACT EXTRACTION W/PHACO Right 08/31/2016   Procedure: CATARACT EXTRACTION PHACO AND INTRAOCULAR LENS PLACEMENT ([place]);  Surgeon: Rutherford Guys, MD;  Location: [facility];  Service: Ophthalmology;  Laterality: Right;  CDE: 8.46  . CATARACT EXTRACTION W/PHACO Left 09/14/2016   Procedure: CATARACT EXTRACTION PHACO AND INTRAOCULAR LENS PLACEMENT ([facility]);  Surgeon: Rutherford Guys, MD;  Location: [facility];  Service: Ophthalmology;  Laterality: Left;  CDE: 9.32  . colonscopy    . HEMORRHOID SURGERY    . TRANSTHORACIC ECHOCARDIOGRAM  2009   normal LV & RV systolic function; mild mitral annular calcif & mild MR; trace TR    Current  Outpatient Medications  Medication Sig Dispense Refill  . amLODipine (NORVASC) 5 MG tablet Take 5 mg by mouth daily.    . Calcium Carbonate (CALTRATE 600 PO) Take 600 mg by mouth daily.     . citalopram (CELEXA) 20 MG tablet Take 20 mg by mouth daily.    . famotidine (PEPCID) 20 MG tablet Take 20 mg by mouth 2 (two) times daily.    . hydrocortisone cream 0.5 % Apply 1 application topically 2 (two) times daily as needed for itching. For hemorrhoid pain    . levothyroxine (SYNTHROID, LEVOTHROID) 50 MCG tablet Take 50 mcg by mouth daily.    Marland Kitchen LORazepam (ATIVAN) 1 MG tablet Take 1 mg by mouth at bedtime. For anxiety    . losartan (COZAAR) 100 MG tablet Take 100 mg by mouth daily.     . nebivolol (BYSTOLIC) 10 MG tablet Take 1 tablet (10 mg total) by mouth daily. 30 tablet 6  . simvastatin (ZOCOR) 40 MG tablet Take 40 mg by mouth at bedtime.       No current facility-administered medications for this visit.    Allergies as of 01/23/2020 - Review Complete 01/23/2020  Allergen Reaction Noted  . Bisoprolol-hydrochlorothiazide Other (See Comments) 06/24/2011  . Hydrocodone Other (See Comments) 06/24/2011  . Oxycodone Itching 08/04/2011  . Oxycodone hcl Itching 08/04/2011  . Ziac [bisoprolol-hydrochlorothiazide] Other (See Comments) 06/24/2011  . Penicillins Rash 06/16/2011    Family History  Problem Relation Age of Onset  . Colon cancer Mother        also stroke  . Pancreatic cancer Father        also heart disease  . Diabetes Sister   . Hypertension Sister   . Anesthesia problems Neg Hx   . Hypotension Neg Hx   . Malignant hyperthermia Neg Hx   . Pseudochol deficiency Neg Hx     Social History   Socioeconomic History  . Marital status: Married    Spouse name: Not on file  . Number of children: Not on file  . Years of education: 37   . Highest education level: Not on file  Occupational History  . Occupation: retired    Fish farm manager: RETIRED  Tobacco Use  . Smoking status: Never  Smoker  . Smokeless tobacco: Never Used  Substance and Sexual Activity  . Alcohol use: No  . Drug use: No  . Sexual activity: Yes    Birth control/protection: Surgical  Other Topics Concern  . Not on file  Social History Narrative   Married with three children   [name]- age 33, Lattie Haw age 62 and [place] age 64- all reside in [place]   Social Determinants of  Health   Financial Resource Strain:   . Difficulty of Paying Living Expenses:   Food Insecurity:   . Worried About Charity fundraiser in the Last Year:   . Arboriculturist in the Last Year:   Transportation Needs:   . Film/video editor (Medical):   Marland Kitchen Lack of Transportation (Non-Medical):   Physical Activity:   . Days of Exercise per Week:   . Minutes of Exercise per Session:   Stress:   . Feeling of Stress :   Social Connections:   . Frequency of Communication with Friends and Family:   . Frequency of Social Gatherings with Friends and Family:   . Attends Religious Services:   . Active Member of Clubs or Organizations:   . Attends Archivist Meetings:   Marland Kitchen Marital Status:   Intimate Partner Violence:   . Fear of Current or Ex-Partner:   . Emotionally Abused:   Marland Kitchen Physically Abused:   . Sexually Abused:      Physical Exam: BP 118/64   Pulse 67   Ht 5\' 2"  (1.575 m)   Wt 157 lb (71.2 kg)   BMI 28.72 kg/m  Constitutional: generally well-appearing Psychiatric: alert and oriented x3 Eyes: extraocular movements intact Mouth: oral pharynx moist, no lesions Neck: supple no lymphadenopathy Cardiovascular: heart regular rate and rhythm Lungs: clear to auscultation bilaterally Abdomen: soft, nontender, nondistended, no obvious ascites, no peritoneal signs, normal bowel sounds Extremities: no lower extremity edema bilaterally Skin: no lesions on visible extremities   Assessment and plan: 79 y.o. female with family history colon cancer, minor rectal bleeding, intermittent dysphagia, chronic  constipation, alternating bowel habits with diarrhea  First I recommended that she try fiber supplements to see if this will help her chronic constipation, alternating bowel habits.  Second I recommended that if she is taking Pepcid for her heartburn she should take 1 pill at night shortly before bedtime and then repeat if needed later on the next day.  I recommend colonoscopy as well as upper endoscopy for her family history of colon cancer, rectal bleeding and chronic dysphagia.  I see no reason for any further blood tests or imaging studies prior to then.    Please see the "Patient Instructions" section for addition details about the plan.   Owens Loffler, MD [facility] 01/23/2020, 11:24 AM  Cc: Wilber Bihari Cornett*  Total time on date of encounter was 45  minutes (this included time spent preparing to see the patient reviewing records; obtaining and/or reviewing separately obtained history; performing a medically appropriate exam and/or evaluation; counseling and educating the patient and family if present; ordering medications, tests or procedures if applicable; and documenting clinical information in the health record).

## 2020-03-03 ENCOUNTER — Telehealth: Payer: Self-pay

## 2020-03-03 ENCOUNTER — Telehealth: Payer: Self-pay | Admitting: Gastroenterology

## 2020-03-03 NOTE — Telephone Encounter (Signed)
Spoke with Dr. Havery Moros who was oncall for Dr. Ardis Hughs today and due to the low grade fever he recommended that the patient just plan to have the upper endoscopy with her having the dysphagia and post pone the colonscopy. Pt and husband agreed with the possibility of the EGD being canceled tomorrow if she has a fever greater then 100.

## 2020-03-03 NOTE — Telephone Encounter (Signed)
Spoke with patient's husband and he stated that the patient had a tooth pulled last week. She has been running a low grade fever only in the morning of 99.4-99.7 and then it goes away. She has no other symptoms. She has been back to the dentist and he said the site looks fine and that this could just be viral. She has had her COVID vaccines. Will check with Dr. Ardis Hughs about her procedure tomorrow.

## 2020-03-03 NOTE — Telephone Encounter (Signed)
Patients husband is calling, patient has procedure tomorrow. has some questions he wants to talk with you about, states wife is running low grade fever, last week had to go to dentist due to some pain, and isnt sure if its from that. asked to speak with nurse.

## 2020-03-03 NOTE — Telephone Encounter (Signed)
Called patient to verify she has received both Covid vaccines.  Patient was unavailable but her spouse confirmed she had completed the series in April 2021.

## 2020-03-04 ENCOUNTER — Ambulatory Visit (AMBULATORY_SURGERY_CENTER): Payer: Medicare Other | Admitting: Gastroenterology

## 2020-03-04 ENCOUNTER — Encounter: Payer: Medicare Other | Admitting: Gastroenterology

## 2020-03-04 ENCOUNTER — Encounter: Payer: Self-pay | Admitting: Gastroenterology

## 2020-03-04 ENCOUNTER — Other Ambulatory Visit: Payer: Self-pay

## 2020-03-04 VITALS — BP 133/84 | HR 80 | Temp 98.6°F | Resp 12 | Ht 62.0 in | Wt 157.0 lb

## 2020-03-04 DIAGNOSIS — K259 Gastric ulcer, unspecified as acute or chronic, without hemorrhage or perforation: Secondary | ICD-10-CM | POA: Diagnosis not present

## 2020-03-04 DIAGNOSIS — K219 Gastro-esophageal reflux disease without esophagitis: Secondary | ICD-10-CM | POA: Diagnosis not present

## 2020-03-04 DIAGNOSIS — R1319 Other dysphagia: Secondary | ICD-10-CM | POA: Diagnosis not present

## 2020-03-04 DIAGNOSIS — K209 Esophagitis, unspecified without bleeding: Secondary | ICD-10-CM | POA: Diagnosis not present

## 2020-03-04 DIAGNOSIS — K2970 Gastritis, unspecified, without bleeding: Secondary | ICD-10-CM | POA: Diagnosis not present

## 2020-03-04 DIAGNOSIS — E785 Hyperlipidemia, unspecified: Secondary | ICD-10-CM | POA: Diagnosis not present

## 2020-03-04 DIAGNOSIS — K208 Other esophagitis without bleeding: Secondary | ICD-10-CM | POA: Diagnosis not present

## 2020-03-04 DIAGNOSIS — R1310 Dysphagia, unspecified: Secondary | ICD-10-CM | POA: Diagnosis not present

## 2020-03-04 DIAGNOSIS — K3189 Other diseases of stomach and duodenum: Secondary | ICD-10-CM | POA: Diagnosis not present

## 2020-03-04 MED ORDER — OMEPRAZOLE 40 MG PO CPDR: 40.0000 mg | DELAYED_RELEASE_CAPSULE | Freq: Two times a day (BID) | ORAL | 6 refills | Status: DC

## 2020-03-04 MED ORDER — SODIUM CHLORIDE 0.9 % IV SOLN: 500.0000 mL | Freq: Once | INTRAVENOUS | Status: DC

## 2020-03-04 NOTE — Patient Instructions (Addendum)
Handouts provided on gastritis and esophagitis.  Continue present medications. Please continue Pepcid 20mg  pill, one pill daily at bedtime.  Start new medication: Omeprazole 40mg  pills, one pill twice daily (before breakfast and dinner meals).  Return to office visit with Dr. Ardis Hughs in 5-6 weeks to see how you have responded to antiacid therapy above and also to reschedule your colonoscopy.   No aspirin, ibuprofen, naproxen, or other non-steriodal anti-inflammatory drugs. Take Tylenol if needed for mild pain or fevers.    YOU HAD AN ENDOSCOPIC PROCEDURE TODAY AT [facility]:   Refer to the procedure report that was given to you for any specific questions about what was found during the examination.  If the procedure report does not answer your questions, please call your gastroenterologist to clarify.  If you requested that your care partner not be given the details of your procedure findings, then the procedure report has been included in a sealed envelope for you to review at your convenience later.  YOU SHOULD EXPECT: Some feelings of bloating in the abdomen. Passage of more gas than usual.  Walking can help get rid of the air that was put into your GI tract during the procedure and reduce the bloating. If you had a lower endoscopy (such as a colonoscopy or flexible sigmoidoscopy) you may notice spotting of blood in your stool or on the toilet paper. If you underwent a bowel prep for your procedure, you may not have a normal bowel movement for a few days.  Please Note:  You might notice some irritation and congestion in your nose or some drainage.  This is from the oxygen used during your procedure.  There is no need for concern and it should clear up in a day or so.  SYMPTOMS TO REPORT IMMEDIATELY:   Following upper endoscopy (EGD)  Vomiting of blood or coffee ground material  New chest pain or pain under the shoulder blades  Painful or persistently difficult  swallowing  New shortness of breath  Fever of 100F or higher  Black, tarry-looking stools  For urgent or emergent issues, a gastroenterologist can be reached at any hour by calling [phone]. Do not use MyChart messaging for urgent concerns.    DIET:  We do recommend a small meal at first, but then you may proceed to your regular diet.  Drink plenty of fluids but you should avoid alcoholic beverages for 24 hours.  ACTIVITY:  You should plan to take it easy for the rest of today and you should NOT DRIVE or use heavy machinery until tomorrow (because of the sedation medicines used during the test).    FOLLOW UP: Our staff will call the number listed on your records 48-72 hours following your procedure to check on you and address any questions or concerns that you may have regarding the information given to you following your procedure. If we do not reach you, we will leave a message.  We will attempt to reach you two times.  During this call, we will ask if you have developed any symptoms of COVID 19. If you develop any symptoms (ie: fever, flu-like symptoms, shortness of breath, cough etc.) before then, please call [phone].  If you test positive for Covid 19 in the 2 weeks post procedure, please call and report this information to [place].    If any biopsies were taken you will be contacted by phone or by letter within the next 1-3 weeks.  Please call us at [phone]  [phone] if you have not heard about the biopsies in 3 weeks.    SIGNATURES/CONFIDENTIALITY: You and/or your care partner have signed paperwork which will be entered into your electronic medical record.  These signatures attest to the fact that that the information above on your After Visit Summary has been reviewed and is understood.  Full responsibility of the confidentiality of this discharge information lies with you and/or your care-partner.

## 2020-03-04 NOTE — Progress Notes (Signed)
Report given to PACU, vss   1105 Pt experienced laryngeal spasm with desat to mid 50s with PPV with return to sat>90 percent, vss

## 2020-03-04 NOTE — Op Note (Signed)
[facility] Patient Name: Jane Wilkerson Procedure Date: 03/04/2020 10:45 AM MRN: [id number] Endoscopist: Milus Banister , MD Age: 79 Referring MD:  Date of Birth: 29-Jul-1940 Gender: Female Account #: [id number] Procedure:                Upper GI endoscopy Indications:              Dysphagia, Heartburn Medicines:                Monitored Anesthesia Care Procedure:                Pre-Anesthesia Assessment:                           - Prior to the procedure, a History and Physical                            was performed, and patient medications and                            allergies were reviewed. The patient's tolerance of                            previous anesthesia was also reviewed. The risks                            and benefits of the procedure and the sedation                            options and risks were discussed with the patient.                            All questions were answered, and informed consent                            was obtained. Prior Anticoagulants: The patient has                            taken no previous anticoagulant or antiplatelet                            agents. ASA Grade Assessment: III - A patient with                            severe systemic disease. After reviewing the risks                            and benefits, the patient was deemed in                            satisfactory condition to undergo the procedure.                           After obtaining informed consent, the endoscope was  passed under direct vision. Throughout the                            procedure, the patient's blood pressure, pulse, and                            oxygen saturations were monitored continuously. The                            Endoscope was introduced through the mouth, and                            advanced to the second part of duodenum. The upper                            GI endoscopy was accomplished  without difficulty.                            The patient tolerated the procedure well. Scope In: Scope Out: Findings:                 Ulcerative esophagitis, circumferential, spoke-like                            starting at Z line, 3cm. There is edema and slight                            stenosis at GE junction. No obvious neoplastic                            changes however the area was biopsied and sent to                            pathology (jar 2).                           Moderate inflammation characterized by erosions,                            erythema, friability and granularity was found in                            the gastric antrum. Biopsies were taken with a cold                            forceps for histology (jar 1).                           The exam was otherwise without abnormality. Complications:            No immediate complications. Estimated blood loss:                            None. Estimated Blood Loss:     Estimated blood loss: none. Impression:               -  Ulcerative esophagitis, circumferential,                            spoke-like starting at Z line, 3cm. There is edema                            and slight stenosis at GE junction. No obvious                            neoplastic changes however the area was biopsied                            and sent to pathology (jar 2).                           - Gastritis. Biopsied to check for H. pylori.                           - The examination was otherwise normal. Recommendation:           - Patient has a contact number available for                            emergencies. The signs and symptoms of potential                            delayed complications were discussed with the                            patient. Return to normal activities tomorrow.                            Written discharge instructions were provided to the                            patient.                           -  Resume previous diet.                           - Continue present medications. Please continue                            pepcid 20mg  pill, one pill at bedtime nightly.                           - Start new medicine (new prescription sent today):                            omeprazole 40mg  pills, one pill twice daily (before                            breakfast and dinner meals).                           -  Return office visit with Dr. Ardis Hughs in 5-6 weeks                            to see how you have responded to antiacid therapy                            above and also to reschedule your colonsocopy.                           - Await pathology results. Milus Banister, MD 03/04/2020 11:10:18 AM This report has been signed electronically.

## 2020-03-04 NOTE — Progress Notes (Signed)
Robinul 0.1 mg IV given due large amount of secretions upon assessment.  MD made aware, vss 

## 2020-03-06 ENCOUNTER — Telehealth: Payer: Self-pay

## 2020-03-06 NOTE — Telephone Encounter (Signed)
°  Follow up Call-  Call back number 03/04/2020  Post procedure Call Back phone  # [phone]  Permission to leave phone message Yes  Some recent data might be hidden     Patient questions:  Do you have a fever, pain , or abdominal swelling? Yes.   Pain Score  0 *  Have you tolerated food without any problems? Yes.    Have you been able to return to your normal activities? No.  Do you have any questions about your discharge instructions: Diet   No. Medications  No. Follow up visit  No.  Do you have questions or concerns about your Care? Yes.  Patient with complaints of low grade fever  99.1, neck swollen and bruised. Swelling has decreased each day. No difficulty eating or drinking. Patient did have wisdom tooth extraction on Aug. 19. No difficulty breathing.  Actions: * If pain score is 4 or above: No action needed, pain <4.  1. Have you developed a fever since your procedure? no  2.   Have you had an respiratory symptoms (SOB or cough) since your procedure? no  3.   Have you tested positive for COVID 19 since your procedure no  4.   Have you had any family members/close contacts diagnosed with the COVID 19 since your procedure?  no   If yes to any of these questions please route to Joylene John, RN and Joella Prince, RN

## 2020-03-06 NOTE — Telephone Encounter (Signed)
Sounds like this is probably all from her recent wisdom tooth extraction.  thanks

## 2020-03-11 ENCOUNTER — Other Ambulatory Visit: Payer: Self-pay

## 2020-03-11 ENCOUNTER — Ambulatory Visit
Admission: EM | Admit: 2020-03-11 | Discharge: 2020-03-11 | Disposition: A | Discharge status: Home / Self Care | Payer: Medicare Other | Attending: Emergency Medicine | Admitting: Emergency Medicine

## 2020-03-11 DIAGNOSIS — Z1152 Encounter for screening for COVID-19: Secondary | ICD-10-CM

## 2020-03-11 NOTE — ED Triage Notes (Signed)
covid test for procedure

## 2020-03-13 LAB — NOVEL CORONAVIRUS, NAA: SARS-CoV-2, NAA: NOT DETECTED

## 2020-03-13 LAB — SARS-COV-2, NAA 2 DAY TAT

## 2020-03-18 ENCOUNTER — Telehealth: Payer: Self-pay | Admitting: Gastroenterology

## 2020-03-18 NOTE — Telephone Encounter (Signed)
Pt is requesting a call back from a nurse regarding her biopsy resullts

## 2020-03-18 NOTE — Telephone Encounter (Signed)
The biopsies from your upper endoscopy procedure showed no sign of infection or cancer. Continue with the plans outlined the day of your procedure. I look forward to seeing you in the office in follow-up in the next several weeks to see how you are feeling.  The patient has been notified of this information and all questions answered.

## 2020-04-03 DIAGNOSIS — I10 Essential (primary) hypertension: Secondary | ICD-10-CM | POA: Diagnosis not present

## 2020-04-03 DIAGNOSIS — E063 Autoimmune thyroiditis: Secondary | ICD-10-CM | POA: Diagnosis not present

## 2020-04-03 DIAGNOSIS — E7849 Other hyperlipidemia: Secondary | ICD-10-CM | POA: Diagnosis not present

## 2020-04-17 DIAGNOSIS — N1831 Chronic kidney disease, stage 3a: Secondary | ICD-10-CM | POA: Diagnosis not present

## 2020-04-17 DIAGNOSIS — E211 Secondary hyperparathyroidism, not elsewhere classified: Secondary | ICD-10-CM | POA: Diagnosis not present

## 2020-04-17 DIAGNOSIS — I129 Hypertensive chronic kidney disease with stage 1 through stage 4 chronic kidney disease, or unspecified chronic kidney disease: Secondary | ICD-10-CM | POA: Diagnosis not present

## 2020-04-21 DIAGNOSIS — F419 Anxiety disorder, unspecified: Secondary | ICD-10-CM | POA: Diagnosis not present

## 2020-04-21 DIAGNOSIS — Z6828 Body mass index (BMI) 28.0-28.9, adult: Secondary | ICD-10-CM | POA: Diagnosis not present

## 2020-04-21 DIAGNOSIS — E663 Overweight: Secondary | ICD-10-CM | POA: Diagnosis not present

## 2020-04-21 DIAGNOSIS — M5431 Sciatica, right side: Secondary | ICD-10-CM | POA: Diagnosis not present

## 2020-04-23 DIAGNOSIS — I129 Hypertensive chronic kidney disease with stage 1 through stage 4 chronic kidney disease, or unspecified chronic kidney disease: Secondary | ICD-10-CM | POA: Diagnosis not present

## 2020-04-23 DIAGNOSIS — F4320 Adjustment disorder, unspecified: Secondary | ICD-10-CM | POA: Diagnosis not present

## 2020-04-23 DIAGNOSIS — N1832 Chronic kidney disease, stage 3b: Secondary | ICD-10-CM | POA: Diagnosis not present

## 2020-05-03 DIAGNOSIS — I10 Essential (primary) hypertension: Secondary | ICD-10-CM | POA: Diagnosis not present

## 2020-05-03 DIAGNOSIS — E7849 Other hyperlipidemia: Secondary | ICD-10-CM | POA: Diagnosis not present

## 2020-05-03 DIAGNOSIS — E063 Autoimmune thyroiditis: Secondary | ICD-10-CM | POA: Diagnosis not present

## 2020-05-23 ENCOUNTER — Ambulatory Visit (INDEPENDENT_AMBULATORY_CARE_PROVIDER_SITE_OTHER): Payer: Medicare Other | Admitting: Gastroenterology

## 2020-05-23 ENCOUNTER — Encounter: Payer: Self-pay | Admitting: Gastroenterology

## 2020-05-23 DIAGNOSIS — K219 Gastro-esophageal reflux disease without esophagitis: Secondary | ICD-10-CM

## 2020-05-23 DIAGNOSIS — Z800 Family history of malignant neoplasm of digestive organs: Secondary | ICD-10-CM | POA: Diagnosis not present

## 2020-05-23 MED ORDER — SUPREP BOWEL PREP KIT 17.5-3.13-1.6 GM/177ML PO SOLN: 1.0000 | ORAL | 0 refills | Status: DC

## 2020-05-23 NOTE — Patient Instructions (Signed)
If you are age 78 or older, your body mass index should be between 23-30. Your Body mass index is 29.08 kg/m. If this is out of the aforementioned range listed, please consider follow up with your Primary Care Provider.  If you are age 20 or younger, your body mass index should be between 19-25. Your Body mass index is 29.08 kg/m. If this is out of the aformentioned range listed, please consider follow up with your Primary Care Provider.   You have been scheduled for a colonoscopy. Please follow written instructions given to you at your visit today.  Please pick up your prep supplies at the pharmacy within the next 1-3 days. If you use inhalers (even only as needed), please bring them with you on the day of your procedure.  Please take omeprazole shortly before breakfast meal each day  Please take pepcid at bedtime every night.  Thank you for entrusting me with your care and choosing [facility].  Dr Ardis Hughs

## 2020-05-23 NOTE — Progress Notes (Signed)
Review of pertinent gastrointestinal problems: 1.  Family history of colon cancer, her mother had colon cancer in her 75s.  Colonoscopy June 2011 Dr. Melony Overly.  Single subcentimeter hyperplastic polyp was removed 2.  Severe ulcerative esophagitis, reflux related.  Also gastritis noted by EGD August 2021 that was done for GERD, dysphagia.  Biopsies of the GE junction showed no sign of Barrett's or neoplasia.  Biopsies of stomach showed no sign of H. Pylori.   HPI: This is a very pleasant 79 year old woman who is here with her husband today  I last saw her at the time of EGD about 2-1/2 months ago.  She was supposed to have a colonoscopy at that time but had a fever in the colonoscopy was canceled.  EGD report above.  Following the procedure she was started on twice daily omeprazole and recommended to continue on her Pepcid at bedtime every night.  Since then she has felt great.  Her indigestion and her dysphagia are completely 100% gone.  She is thrilled.  She is pretty good about taking the omeprazole twice daily but admits she does sometimes forget her dinner dosing.  ROS: complete GI ROS as described in HPI, all other review negative.  Constitutional:  No unintentional weight loss   Past Medical History:  Diagnosis Date  . Anxiety 07/07/2011   takes Ativan every 4hrs  . Arthritis    back   . Asthma    uses inhaler prn  . Breast cancer ([place])    right - local excision, chemotheraphy  . Cataract    right eye  . Chronic back pain   . Depression    has appt to discuss depression 07/26/2011  . Diverticulosis   . Dizziness    occasionally  . Gastric ulcer   . GERD (gastroesophageal reflux disease)    takes Nexium daily  . Hemorrhoids   . History of nuclear stress test 11/2010   lexiscan; no evidence of inducible ischemia; normal pattern of perfusion   . History of shingles 2011  . Hx of colonic polyps   . Hyperlipidemia    takes Zocor daily  . Hypertension    takes Bystolic and  Losartan daily  . Hypothyroidism    takes Synthroid daily  . Nausea alone    onset last Wednesday  08/04/11  . Nocturia   . Osteoporosis   . PONV (postoperative nausea and vomiting)   . PVC's (premature ventricular contractions)   . Shortness of breath    with exertion  . Urinary frequency     Past Surgical History:  Procedure Laterality Date  . ABDOMINAL HYSTERECTOMY  2001  . BREAST LUMPECTOMY Right 07/02/11   right breast and SNL  . BREAST SURGERY Left 1972   left lumpectomy  . BREAST SURGERY Right 08/11/11   margins on rt breast  . CATARACT EXTRACTION W/PHACO Right 08/31/2016   Procedure: CATARACT EXTRACTION PHACO AND INTRAOCULAR LENS PLACEMENT ([place]);  Surgeon: Rutherford Guys, MD;  Location: [facility];  Service: Ophthalmology;  Laterality: Right;  CDE: 8.46  . CATARACT EXTRACTION W/PHACO Left 09/14/2016   Procedure: CATARACT EXTRACTION PHACO AND INTRAOCULAR LENS PLACEMENT ([facility]);  Surgeon: Rutherford Guys, MD;  Location: [facility];  Service: Ophthalmology;  Laterality: Left;  CDE: 9.32  . colonscopy    . HEMORRHOID SURGERY    . TRANSTHORACIC ECHOCARDIOGRAM  2009   normal LV & RV systolic function; mild mitral annular calcif & mild MR; trace TR    Current Outpatient Medications  Medication Sig  Dispense Refill  . amLODipine (NORVASC) 5 MG tablet Take 5 mg by mouth daily.    . Calcium Carbonate (CALTRATE 600 PO) Take 600 mg by mouth daily.     . citalopram (CELEXA) 20 MG tablet Take 20 mg by mouth daily.    . famotidine (PEPCID) 20 MG tablet Take 20 mg by mouth 2 (two) times daily.    . hydrocortisone cream 0.5 % Apply 1 application topically 2 (two) times daily as needed for itching. For hemorrhoid pain    . levothyroxine (SYNTHROID, LEVOTHROID) 50 MCG tablet Take 50 mcg by mouth daily.    Marland Kitchen LORazepam (ATIVAN) 1 MG tablet Take 1 mg by mouth at bedtime. For anxiety    . losartan (COZAAR) 100 MG tablet Take 100 mg by mouth daily.     . nebivolol (BYSTOLIC) 10 MG tablet Take 1 tablet (10 mg  total) by mouth daily. 30 tablet 6  . omeprazole (PRILOSEC) 40 MG capsule Take 1 capsule (40 mg total) by mouth 2 (two) times daily before a meal. Take before breakfast and dinner meals. 60 capsule 6  . simvastatin (ZOCOR) 40 MG tablet Take 40 mg by mouth at bedtime.       No current facility-administered medications for this visit.    Allergies as of 05/23/2020 - Review Complete 05/23/2020  Allergen Reaction Noted  . Bisoprolol-hydrochlorothiazide Other (See Comments) 06/24/2011  . Hydrocodone Other (See Comments) 06/24/2011  . Oxycodone Itching 08/04/2011  . Oxycodone hcl Itching 08/04/2011  . Ziac [bisoprolol-hydrochlorothiazide] Other (See Comments) 06/24/2011  . Penicillins Rash 06/16/2011    Family History  Problem Relation Age of Onset  . Colon cancer Mother        also stroke  . Pancreatic cancer Father        also heart disease  . Diabetes Sister   . Hypertension Sister   . Anesthesia problems Neg Hx   . Hypotension Neg Hx   . Malignant hyperthermia Neg Hx   . Pseudochol deficiency Neg Hx     Social History   Socioeconomic History  . Marital status: Married    Spouse name: Not on file  . Number of children: Not on file  . Years of education: 72   . Highest education level: Not on file  Occupational History  . Occupation: retired    Fish farm manager: RETIRED  Tobacco Use  . Smoking status: Never Smoker  . Smokeless tobacco: Never Used  Substance and Sexual Activity  . Alcohol use: No  . Drug use: No  . Sexual activity: Yes    Birth control/protection: Surgical  Other Topics Concern  . Not on file  Social History Narrative   Married with three children   [name]- age 6, Lattie Haw age 30 and [place] age 81- all reside in [place]   Social Determinants of Health   Financial Resource Strain:   . Difficulty of Paying Living Expenses: Not on file  Food Insecurity:   . Worried About Charity fundraiser in the Last Year: Not on file  . Ran Out of Food in the Last  Year: Not on file  Transportation Needs:   . Lack of Transportation (Medical): Not on file  . Lack of Transportation (Non-Medical): Not on file  Physical Activity:   . Days of Exercise per Week: Not on file  . Minutes of Exercise per Session: Not on file  Stress:   . Feeling of Stress : Not on file  Social Connections:   . Frequency  of Communication with Friends and Family: Not on file  . Frequency of Social Gatherings with Friends and Family: Not on file  . Attends Religious Services: Not on file  . Active Member of Clubs or Organizations: Not on file  . Attends Archivist Meetings: Not on file  . Marital Status: Not on file  Intimate Partner Violence:   . Fear of Current or Ex-Partner: Not on file  . Emotionally Abused: Not on file  . Physically Abused: Not on file  . Sexually Abused: Not on file     Physical Exam: BP 130/80   Pulse 81   Ht 5\' 2"  (1.575 m)   Wt 159 lb (72.1 kg)   SpO2 97%   BMI 29.08 kg/m  Constitutional: generally well-appearing Psychiatric: alert and oriented x3 Abdomen: soft, nontender, nondistended, no obvious ascites, no peritoneal signs, normal bowel sounds No peripheral edema noted in lower extremities  Assessment and plan: 79 y.o. female with family history colon cancer, GERD, ulcerative esophagitis  Her GERD symptoms are completely resolved and her dysphagia is also completely resolved since starting the proton pump inhibitor twice daily.  I think that it is probably safe for her to back down to once daily omeprazole which she will take shortly before her breakfast meal.  She will continue taking Pepcid at bedtime every night.  She needs to be rescheduled for a colonoscopy for family history of colon cancer.  At that time I will be interested to hear how she is doing on her new dosing regimen of her antiacid medicines.  Please see the "Patient Instructions" section for addition details about the plan.  Owens Loffler, MD [facility] 05/23/2020, 1:51 PM   Total time on date of encounter was 30 minutes (this included time spent preparing to see the patient reviewing records; obtaining and/or reviewing separately obtained history; performing a medically appropriate exam and/or evaluation; counseling and educating the patient and family if present; ordering medications, tests or procedures if applicable; and documenting clinical information in the health record).

## 2020-05-24 DIAGNOSIS — Z23 Encounter for immunization: Secondary | ICD-10-CM | POA: Diagnosis not present

## 2020-06-03 ENCOUNTER — Ambulatory Visit (INDEPENDENT_AMBULATORY_CARE_PROVIDER_SITE_OTHER): Payer: Medicare Other | Admitting: Internal Medicine

## 2020-06-03 ENCOUNTER — Other Ambulatory Visit: Payer: Self-pay

## 2020-06-03 ENCOUNTER — Encounter: Payer: Self-pay | Admitting: Internal Medicine

## 2020-06-03 VITALS — BP 124/83 | HR 79 | Temp 97.3°F | Ht 62.0 in | Wt 159.2 lb

## 2020-06-03 DIAGNOSIS — E7849 Other hyperlipidemia: Secondary | ICD-10-CM | POA: Diagnosis not present

## 2020-06-03 DIAGNOSIS — R0989 Other specified symptoms and signs involving the circulatory and respiratory systems: Secondary | ICD-10-CM

## 2020-06-03 DIAGNOSIS — E063 Autoimmune thyroiditis: Secondary | ICD-10-CM | POA: Diagnosis not present

## 2020-06-03 DIAGNOSIS — E785 Hyperlipidemia, unspecified: Secondary | ICD-10-CM

## 2020-06-03 DIAGNOSIS — I77810 Thoracic aortic ectasia: Secondary | ICD-10-CM

## 2020-06-03 DIAGNOSIS — I10 Essential (primary) hypertension: Secondary | ICD-10-CM | POA: Diagnosis not present

## 2020-06-03 DIAGNOSIS — R002 Palpitations: Secondary | ICD-10-CM

## 2020-06-03 MED ORDER — CARVEDILOL 3.125 MG PO TABS: 3.1250 mg | ORAL_TABLET | Freq: Two times a day (BID) | ORAL | 3 refills | Status: DC

## 2020-06-03 NOTE — Patient Instructions (Signed)
Medication Instructions:  STOP bystolic START carvedilol 3.125mg  twice daily CONTINUE all other current medications  *If you need a refill on your cardiac medications before your next appointment, please call your pharmacy*  Follow-Up: At [facility], you and your health needs are our priority.  As part of our continuing mission to provide you with exceptional heart care, we have created designated Provider Care Teams.  These Care Teams include your primary Cardiologist (physician) and Advanced Practice Providers (APPs -  Physician Assistants and Nurse Practitioners) who all work together to provide you with the care you need, when you need it.  We recommend signing up for the patient portal called "MyChart".  Sign up information is provided on this After Visit Summary.  MyChart is used to connect with patients for Virtual Visits (Telemedicine).  Patients are able to view lab/test results, encounter notes, upcoming appointments, etc.  Non-urgent messages can be sent to your provider as well.   To learn more about what you can do with MyChart, go to [email or web address].    Your next appointment:   12 month(s)  The format for your next appointment:   In Person  Provider:   You may see Dr. Debara Pickett or one of the following Advanced Practice Providers on your designated Care Team:    Almyra Deforest, PA-C  Fabian Sharp, [place] or   Roby Lofts, [place]    Other Instructions

## 2020-06-03 NOTE — Progress Notes (Signed)
OFFICE NOTE  Chief Complaint:  Routine follow-up  Primary Care Physician: Redmond School, MD  HPI:  Jane Wilkerson  is a 79 year old female with a history of PVCs and bigeminy but had a stress test in May which was negative and that seemed to have resolved. She has had some reflux symptoms as well which improved. Unfortunately over the past year I understand she was diagnosed with breast cancer which is DCIS and underwent local excision and is on chemotherapy. She seems to be doing well and is about a year out from that, and fortunately that was caught early on mammogram. From a cardiac standpoint she denies any chest pain, worsening shortness of breath, palpitations, presyncope or syncopal symptoms.  Recently she had been told that she had some abnormalities with her renal function. She was referred to a nephrologist who she saw a couple times and underwent a 24-hour urine in additional blood work. She was subsequently released. She's not had any recent lipid profile testing not aware of. She continues to have some palpitations although they're not quite as bothersome.  She is seeing Dr. Hinda Lenis, who feels that her renal function is stable. He recently increased her by Bystolic to 20 mg daily.  Jane Wilkerson returns today for follow-up. Recently she's been a little more anxious and she's noted some lability in her blood pressures. At times her blood pressure was as low as 80/50 but can run high. She's also felt some fatigue, some aching or tightness in her chest, particularly with sitting up at night and some progressive increase in shortness of breath. Blood pressure today was elevated in the office 162/98. She's recently had some medication timing adjustments by her primary care provider.  I saw Jane Wilkerson back today in the office. Overall she is feeling so slightly better. She does have some labile blood pressures. She underwent nuclear stress testing which was negative for ischemia and  showed normal LV function. Her echo also showed normal LV function. There was borderline enlarged aortic root at 3.7 cm.  10/04/2016  Jane Wilkerson returns for follow-up. She reports recently she's been having some worsening cough and upper airway wheezing. She recently had bilateral cataract surgery which was uneventful. She thinks her symptoms are likely related to GERD and has a follow-up appointment with Dr. Laural Golden in GI. Blood pressure is 142/90 today. She reports some lability in her blood pressures. Namely, she gets some lower blood pressures at night. Recent she was taken off of HCTZ as of low sodium. This is not been recently reassessed. She also has borderline dilatation of the ascending aorta 3.7 cm seen on echo last year. LVEF at the time was 50-55%.  11/21/2017  Jane Wilkerson is seen today in follow-up.  Overall she seems to be doing well.  Unfortunately she had a recent fall and broke her left great toe.  She is been on some antibiotics and pain medicine is developed a rash which is pruritic.  It may be related to that however she thinks it could be related to being outside in the sun.  She has good control of her blood pressure today which is 123/80.  And please report the prior shadow on the right breast which is seen on CT scan is most likely old scar tissue from lumpectomy.  EKG shows sinus rhythm at 76 today.  06/03/2020  Jane Wilkerson returns for follow-up.  Overall has been a difficult year.  She lost her sister about a year ago  and her 29 year old granddaughter more recently.  She has had a number of different health issues as well and is struggled with labile hypertension and presyncope.  Blood pressure today is excellent however she has had some episodes at home where her systolic blood pressure was in the 70s.  She had recent EGD which showed of esophagitis and now is on treatment for that.  EKG today shows sinus rhythm at 79.  Labs in February showed LDL at target less than 70.  A1c was  6.2.  PMHx:  Past Medical History:  Diagnosis Date   Anxiety 07/07/2011   takes Ativan every 4hrs   Arthritis    back    Asthma    uses inhaler prn   Breast cancer ([place])    right - local excision, chemotheraphy   Cataract    right eye   Chronic back pain    Depression    has appt to discuss depression 07/26/2011   Diverticulosis    Dizziness    occasionally   Gastric ulcer    GERD (gastroesophageal reflux disease)    takes Nexium daily   Hemorrhoids    History of nuclear stress test 11/2010   lexiscan; no evidence of inducible ischemia; normal pattern of perfusion    History of shingles 2011   Hx of colonic polyps    Hyperlipidemia    takes Zocor daily   Hypertension    takes Bystolic and Losartan daily   Hypothyroidism    takes Synthroid daily   Nausea alone    onset last Wednesday  08/04/11   Nocturia    Osteoporosis    PONV (postoperative nausea and vomiting)    PVC's (premature ventricular contractions)    Shortness of breath    with exertion   Urinary frequency     Past Surgical History:  Procedure Laterality Date   ABDOMINAL HYSTERECTOMY  2001   BREAST LUMPECTOMY Right 07/02/11   right breast and SNL   BREAST SURGERY Left 1972   left lumpectomy   BREAST SURGERY Right 08/11/11   margins on rt breast   CATARACT EXTRACTION W/PHACO Right 08/31/2016   Procedure: CATARACT EXTRACTION PHACO AND INTRAOCULAR LENS PLACEMENT ([place]);  Surgeon: Rutherford Guys, MD;  Location: [facility];  Service: Ophthalmology;  Laterality: Right;  CDE: 8.46   CATARACT EXTRACTION W/PHACO Left 09/14/2016   Procedure: CATARACT EXTRACTION PHACO AND INTRAOCULAR LENS PLACEMENT ([facility]);  Surgeon: Rutherford Guys, MD;  Location: [facility];  Service: Ophthalmology;  Laterality: Left;  CDE: 9.32   colonscopy     HEMORRHOID SURGERY     TRANSTHORACIC ECHOCARDIOGRAM  2009   normal LV & RV systolic function; mild mitral annular calcif & mild MR; trace TR    FAMHx:  Family  History  Problem Relation Age of Onset   Colon cancer Mother        also stroke   Pancreatic cancer Father        also heart disease   Diabetes Sister    Hypertension Sister    Anesthesia problems Neg Hx    Hypotension Neg Hx    Malignant hyperthermia Neg Hx    Pseudochol deficiency Neg Hx     SOCHx:   reports that she has never smoked. She has never used smokeless tobacco. She reports that she does not drink alcohol and does not use drugs.  ALLERGIES:  Allergies  Allergen Reactions   Bisoprolol-Hydrochlorothiazide Other (See Comments)    Reaction unknown Reaction unknown   Hydrocodone Other (  See Comments)    Reaction unknown Reaction unknown Reaction unknown   Oxycodone Itching   Oxycodone Hcl Itching   Ziac [Bisoprolol-Hydrochlorothiazide] Other (See Comments)    Reaction unknown   Penicillins Rash    Has patient had a PCN reaction causing immediate rash, facial/tongue/throat swelling, SOB or lightheadedness with hypotension: unknown Has patient had a PCN reaction causing severe rash involving mucus membranes or skin necrosis: unknown Has patient had a PCN reaction that required hospitalization No Has patient had a PCN reaction occurring within the last 10 years: No If all of the above answers are "NO", then may proceed with Cephalosporin use.     ROS: Pertinent items noted in HPI and remainder of comprehensive ROS otherwise negative.  HOME MEDS: Current Outpatient Medications  Medication Sig Dispense Refill   amLODipine (NORVASC) 5 MG tablet Take 5 mg by mouth daily.     Calcium Carbonate (CALTRATE 600 PO) Take 600 mg by mouth daily.      citalopram (CELEXA) 20 MG tablet Take 20 mg by mouth daily.     famotidine (PEPCID) 20 MG tablet Take 20 mg by mouth at bedtime.      hydrocortisone cream 0.5 % Apply 1 application topically 2 (two) times daily as needed for itching. For hemorrhoid pain     levothyroxine (SYNTHROID, LEVOTHROID) 50 MCG tablet  Take 50 mcg by mouth daily.     LORazepam (ATIVAN) 1 MG tablet Take 1 mg by mouth at bedtime. For anxiety     losartan (COZAAR) 100 MG tablet Take 100 mg by mouth daily.      Na Sulfate-K Sulfate-Mg Sulf (SUPREP BOWEL PREP KIT) 17.5-3.13-1.6 GM/177ML SOLN Take 1 kit by mouth as directed. 324 mL 0   nebivolol (BYSTOLIC) 10 MG tablet Take 1 tablet (10 mg total) by mouth daily. 30 tablet 6   omeprazole (PRILOSEC) 40 MG capsule Take 1 capsule (40 mg total) by mouth 2 (two) times daily before a meal. Take before breakfast and dinner meals. (Patient taking differently: Take 40 mg by mouth daily. Take before breakfast and dinner meals.) 60 capsule 6   simvastatin (ZOCOR) 40 MG tablet Take 40 mg by mouth at bedtime.       No current facility-administered medications for this visit.    LABS/IMAGING: No results found for this or any previous visit (from the past 48 hour(s)). No results found.  VITALS: BP 124/83    Pulse 79    Temp (!) 97.3 F (36.3 C)    Ht 5\' 2"  (1.575 m)    Wt 159 lb 3.2 oz (72.2 kg)    SpO2 95%    BMI 29.12 kg/m   EXAM: General appearance: alert and no distress Neck: no carotid bruit and no JVD Lungs: clear to auscultation bilaterally Heart: regular rate and rhythm Abdomen: soft, non-tender; bowel sounds normal; no masses,  no organomegaly Extremities: extremities normal, atraumatic, no cyanosis or edema Pulses: 2+ and symmetric Skin: Skin color, texture, turgor normal. No rashes or lesions Neurologic: Grossly normal Psych: Pleasant, mildly anxious  EKG: Normal sinus rhythm at 79-personally reviewed  ASSESSMENT: 1. Chest pressure/dyspnea on exertion - low risk stress test and echocardiogram with normal LV function (08/2015) 2. Top normal ascending aorta 3.7 cm 3. Palpitations-generally well controlled 4. Dyslipidemia 5. Recent history of breast cancer 6. Abnormal renal function by report 7. Labile hypertension 8. Anxiety  PLAN: 1.   Ms. Wilkerson seems to be  stable and doing fairly well.  She denies any  significant palpitations.  She has had a lot of stress.  No cardiac sounding chest pain.  Blood pressure has been labile.  She has had concerns about the cost of Bystolic.  We will switch her to carvedilol at a lower than equivalent dose of 3.125 mg twice daily to allow a little higher blood pressure since she is having lability and has a tendency to drop her pressures.  No changes to her other medicines.  Plan follow-up with me annually or sooner as necessary.  Pixie Casino, MD, [facility], [place] Director of the [facility]  [facility] Diplomate of the American Board of Clinical Lipidology Attending Cardiologist  Direct Dial: [phone]   Fax: [phone]  Website:  [email or web address]  Nadean Corwin Deliah Strehlow 06/03/2020, 3:24 PM

## 2020-07-04 DIAGNOSIS — I10 Essential (primary) hypertension: Secondary | ICD-10-CM | POA: Diagnosis not present

## 2020-07-04 DIAGNOSIS — E063 Autoimmune thyroiditis: Secondary | ICD-10-CM | POA: Diagnosis not present

## 2020-07-04 DIAGNOSIS — E7849 Other hyperlipidemia: Secondary | ICD-10-CM | POA: Diagnosis not present

## 2020-07-29 ENCOUNTER — Encounter: Payer: Medicare Other | Admitting: Gastroenterology

## 2020-08-18 DIAGNOSIS — Z961 Presence of intraocular lens: Secondary | ICD-10-CM | POA: Diagnosis not present

## 2020-08-19 ENCOUNTER — Other Ambulatory Visit (HOSPITAL_COMMUNITY): Payer: Self-pay | Admitting: Physician Assistant

## 2020-08-19 ENCOUNTER — Telehealth: Payer: Self-pay | Admitting: Gastroenterology

## 2020-08-19 DIAGNOSIS — Z6829 Body mass index (BMI) 29.0-29.9, adult: Secondary | ICD-10-CM | POA: Diagnosis not present

## 2020-08-19 DIAGNOSIS — M25551 Pain in right hip: Secondary | ICD-10-CM | POA: Diagnosis not present

## 2020-08-19 DIAGNOSIS — R739 Hyperglycemia, unspecified: Secondary | ICD-10-CM | POA: Diagnosis not present

## 2020-08-19 DIAGNOSIS — Z1389 Encounter for screening for other disorder: Secondary | ICD-10-CM | POA: Diagnosis not present

## 2020-08-19 DIAGNOSIS — Z0000 Encounter for general adult medical examination without abnormal findings: Secondary | ICD-10-CM | POA: Diagnosis not present

## 2020-08-19 DIAGNOSIS — L309 Dermatitis, unspecified: Secondary | ICD-10-CM | POA: Diagnosis not present

## 2020-08-19 DIAGNOSIS — F329 Major depressive disorder, single episode, unspecified: Secondary | ICD-10-CM | POA: Diagnosis not present

## 2020-08-19 DIAGNOSIS — E782 Mixed hyperlipidemia: Secondary | ICD-10-CM | POA: Diagnosis not present

## 2020-08-19 DIAGNOSIS — E7849 Other hyperlipidemia: Secondary | ICD-10-CM | POA: Diagnosis not present

## 2020-08-19 DIAGNOSIS — R7309 Other abnormal glucose: Secondary | ICD-10-CM | POA: Diagnosis not present

## 2020-08-19 NOTE — Telephone Encounter (Signed)
The pt states she started taking omeprazole in November last year and developed a rash about 2 weeks ago.  She asked if maybe she could be allergic to the omeprazole.  I advised that she should keep her PCP appt today and let them evaluate her.  A rash after 3 months of taking the omeprazole would be less likely.  She was also advised if the PCP has concerns about it being the omeprazole they will refer her to our office.  The pt has been advised of the information and verbalized understanding.

## 2020-08-19 NOTE — Telephone Encounter (Signed)
Patient called states she noticed about two weeks an allergic reaction to Omeprazole she said she is going to see her PCP today and will show him otherwise she would like to know if she should discontinue the medication.

## 2020-08-19 NOTE — Telephone Encounter (Signed)
Line busy

## 2020-08-21 ENCOUNTER — Ambulatory Visit (AMBULATORY_SURGERY_CENTER): Payer: Self-pay | Admitting: *Deleted

## 2020-08-21 ENCOUNTER — Ambulatory Visit (HOSPITAL_COMMUNITY)
Admission: RE | Admit: 2020-08-21 | Discharge: 2020-08-21 | Disposition: A | Discharge status: Home / Self Care | Payer: Medicare Other | Source: Ambulatory Visit | Attending: Physician Assistant | Admitting: Physician Assistant

## 2020-08-21 ENCOUNTER — Other Ambulatory Visit: Payer: Self-pay

## 2020-08-21 VITALS — Ht 62.0 in | Wt 160.0 lb

## 2020-08-21 DIAGNOSIS — Z800 Family history of malignant neoplasm of digestive organs: Secondary | ICD-10-CM

## 2020-08-21 DIAGNOSIS — M25551 Pain in right hip: Secondary | ICD-10-CM | POA: Diagnosis present

## 2020-08-21 NOTE — Progress Notes (Addendum)
No egg or soy allergy known to patient  No issues with past sedation with any surgeries or procedures No intubation problems in the past  No FH of Malignant Hyperthermia No diet pills per patient No home 02 use per patient  No blood thinners per patient  Pt denies issues with constipation  No A fib or A flutter  EMMI video to pt or via [place] 19 guidelines implemented in PV today with Pt and RN  Pt is fully vaccinated  for Covid  Pt denies loose or missing teeth, denies dentures, partials, dental implants, capped or bonded teeth   Patient has suprep from a previously cancelled colonoscopy.   Due to the COVID-19 pandemic we are asking patients to follow certain guidelines.  Pt aware of COVID protocols and [facility] guidelines

## 2020-09-01 DIAGNOSIS — E063 Autoimmune thyroiditis: Secondary | ICD-10-CM | POA: Diagnosis not present

## 2020-09-01 DIAGNOSIS — I10 Essential (primary) hypertension: Secondary | ICD-10-CM | POA: Diagnosis not present

## 2020-09-01 DIAGNOSIS — E7849 Other hyperlipidemia: Secondary | ICD-10-CM | POA: Diagnosis not present

## 2020-09-03 ENCOUNTER — Ambulatory Visit (AMBULATORY_SURGERY_CENTER): Payer: Medicare Other | Admitting: Gastroenterology

## 2020-09-03 ENCOUNTER — Encounter: Payer: Self-pay | Admitting: Gastroenterology

## 2020-09-03 ENCOUNTER — Other Ambulatory Visit: Payer: Self-pay

## 2020-09-03 VITALS — BP 128/77 | HR 83 | Temp 97.1°F | Resp 16 | Ht 62.0 in | Wt 160.0 lb

## 2020-09-03 DIAGNOSIS — D123 Benign neoplasm of transverse colon: Secondary | ICD-10-CM | POA: Diagnosis not present

## 2020-09-03 DIAGNOSIS — Z800 Family history of malignant neoplasm of digestive organs: Secondary | ICD-10-CM

## 2020-09-03 DIAGNOSIS — I10 Essential (primary) hypertension: Secondary | ICD-10-CM | POA: Diagnosis not present

## 2020-09-03 DIAGNOSIS — D122 Benign neoplasm of ascending colon: Secondary | ICD-10-CM

## 2020-09-03 DIAGNOSIS — D125 Benign neoplasm of sigmoid colon: Secondary | ICD-10-CM

## 2020-09-03 DIAGNOSIS — D124 Benign neoplasm of descending colon: Secondary | ICD-10-CM | POA: Diagnosis not present

## 2020-09-03 DIAGNOSIS — K219 Gastro-esophageal reflux disease without esophagitis: Secondary | ICD-10-CM | POA: Diagnosis not present

## 2020-09-03 DIAGNOSIS — Z86010 Personal history of colonic polyps: Secondary | ICD-10-CM | POA: Diagnosis not present

## 2020-09-03 DIAGNOSIS — Z1211 Encounter for screening for malignant neoplasm of colon: Secondary | ICD-10-CM | POA: Diagnosis not present

## 2020-09-03 MED ORDER — SODIUM CHLORIDE 0.9 % IV SOLN: 500.0000 mL | Freq: Once | INTRAVENOUS | Status: DC

## 2020-09-03 NOTE — Patient Instructions (Signed)
Information on polyps, diverticulosis and hemorrhoids given to you today.  Await pathology results.  Resume previous diet and medications.  YOU HAD AN ENDOSCOPIC PROCEDURE TODAY AT THE [facility]:   Refer to the procedure report that was given to you for any specific questions about what was found during the examination.  If the procedure report does not answer your questions, please call your gastroenterologist to clarify.  If you requested that your care partner not be given the details of your procedure findings, then the procedure report has been included in a sealed envelope for you to review at your convenience later.  YOU SHOULD EXPECT: Some feelings of bloating in the abdomen. Passage of more gas than usual.  Walking can help get rid of the air that was put into your GI tract during the procedure and reduce the bloating. If you had a lower endoscopy (such as a colonoscopy or flexible sigmoidoscopy) you may notice spotting of blood in your stool or on the toilet paper. If you underwent a bowel prep for your procedure, you may not have a normal bowel movement for a few days.  Please Note:  You might notice some irritation and congestion in your nose or some drainage.  This is from the oxygen used during your procedure.  There is no need for concern and it should clear up in a day or so.  SYMPTOMS TO REPORT IMMEDIATELY:   Following lower endoscopy (colonoscopy or flexible sigmoidoscopy):  Excessive amounts of blood in the stool  Significant tenderness or worsening of abdominal pains  Swelling of the abdomen that is new, acute  Fever of 100F or higher   For urgent or emergent issues, a gastroenterologist can be reached at any hour by calling [phone]. Do not use MyChart messaging for urgent concerns.    DIET:  We do recommend a small meal at first, but then you may proceed to your regular diet.  Drink plenty of fluids but you should avoid alcoholic beverages for 24  hours.  ACTIVITY:  You should plan to take it easy for the rest of today and you should NOT DRIVE or use heavy machinery until tomorrow (because of the sedation medicines used during the test).    FOLLOW UP: Our staff will call the number listed on your records 48-72 hours following your procedure to check on you and address any questions or concerns that you may have regarding the information given to you following your procedure. If we do not reach you, we will leave a message.  We will attempt to reach you two times.  During this call, we will ask if you have developed any symptoms of COVID 19. If you develop any symptoms (ie: fever, flu-like symptoms, shortness of breath, cough etc.) before then, please call [phone].  If you test positive for Covid 19 in the 2 weeks post procedure, please call and report this information to us.    If any biopsies were taken you will be contacted by phone or by letter within the next 1-3 weeks.  Please call us at [phone] if you have not heard about the biopsies in 3 weeks.    SIGNATURES/CONFIDENTIALITY: You and/or your care partner have signed paperwork which will be entered into your electronic medical record.  These signatures attest to the fact that that the information above on your After Visit Summary has been reviewed and is understood.  Full responsibility of the confidentiality of this discharge information lies with you   and/or your care-partner. 

## 2020-09-03 NOTE — Progress Notes (Signed)
Report given to PACU, vss 

## 2020-09-03 NOTE — Progress Notes (Signed)
Pt's states no medical or surgical changes since previsit or office visit.  [place] vitals and JD IV. 

## 2020-09-03 NOTE — Progress Notes (Signed)
Called to room to assist during endoscopic procedure.  Patient ID and intended procedure confirmed with present staff. Received instructions for my participation in the procedure from the performing physician.  

## 2020-09-03 NOTE — Op Note (Signed)
[facility] Patient Name: Jane Wilkerson Procedure Date: 09/03/2020 10:28 AM MRN: [id number] Endoscopist: Milus Banister , MD Age: 80 Referring MD:  Date of Birth: Nov 13, 1940 Gender: Female Account #: [id number] Procedure:                Colonoscopy Indications:              Screening in patient at increased risk: Family                            history of 1st-degree relative with colorectal                            cancer; mother had colon cancer. Last colonoscopy                            2011 Dr. Laural Golden found single HP only Medicines:                Monitored Anesthesia Care Procedure:                Pre-Anesthesia Assessment:                           - Prior to the procedure, a History and Physical                            was performed, and patient medications and                            allergies were reviewed. The patient's tolerance of                            previous anesthesia was also reviewed. The risks                            and benefits of the procedure and the sedation                            options and risks were discussed with the patient.                            All questions were answered, and informed consent                            was obtained. Prior Anticoagulants: The patient has                            taken no previous anticoagulant or antiplatelet                            agents. ASA Grade Assessment: III - A patient with                            severe systemic disease. After reviewing the risks  and benefits, the patient was deemed in                            satisfactory condition to undergo the procedure.                           After obtaining informed consent, the colonoscope                            was passed under direct vision. Throughout the                            procedure, the patient's blood pressure, pulse, and                            oxygen saturations were  monitored continuously. The                            Olympus CF-HQ190L ([id number]) Colonoscope was                            introduced through the anus and advanced to the the                            cecum, identified by appendiceal orifice and                            ileocecal valve. The colonoscopy was performed                            without difficulty. The patient tolerated the                            procedure well. The quality of the bowel                            preparation was good. The ileocecal valve,                            appendiceal orifice, and rectum were photographed. Scope In: 10:35:30 AM Scope Out: 10:46:50 AM Scope Withdrawal Time: 0 hours 7 minutes 56 seconds  Total Procedure Duration: 0 hours 11 minutes 20 seconds  Findings:                 Six sessile polyps were found in the sigmoid colon,                            descending colon, transverse colon and ascending                            colon. The polyps were 2 to 7 mm in size. These                            polyps were removed with a cold snare.  Resection                            and retrieval were complete.                           A few small and large-mouthed diverticula were                            found in the left colon.                           External and internal hemorrhoids were found. The                            hemorrhoids were small.                           The exam was otherwise without abnormality on                            direct and retroflexion views. Complications:            No immediate complications. Estimated blood loss:                            None. Estimated Blood Loss:     Estimated blood loss: none. Impression:               - Six 2 to 7 mm polyps in the sigmoid colon, in the                            descending colon, in the transverse colon and in                            the ascending colon, removed with a cold snare.                             Resected and retrieved.                           - Diverticulosis in the left colon.                           - External and internal hemorrhoids.                           - The examination was otherwise normal on direct                            and retroflexion views. Recommendation:           - Patient has a contact number available for                            emergencies. The signs and symptoms of potential  delayed complications were discussed with the                            patient. Return to normal activities tomorrow.                            Written discharge instructions were provided to the                            patient.                           - Resume previous diet.                           - Continue present medications.                           - Await pathology results. Milus Banister, MD 09/03/2020 10:48:50 AM This report has been signed electronically.

## 2020-09-05 ENCOUNTER — Telehealth: Payer: Self-pay | Admitting: *Deleted

## 2020-09-05 NOTE — Telephone Encounter (Signed)
  Follow up Call-  Call back number 09/03/2020 03/04/2020  Post procedure Call Back phone  # [phone]  Permission to leave phone message Yes Yes  Some recent data might be hidden     Patient questions:  Do you have a fever, pain , or abdominal swelling? No. Pain Score  0 *  Have you tolerated food without any problems? Yes.    Have you been able to return to your normal activities? Yes.    Do you have any questions about your discharge instructions: Diet   No. Medications  No. Follow up visit  No.  Do you have questions or concerns about your Care? No.  Actions: * If pain score is 4 or above: No action needed, pain <4.

## 2020-09-12 ENCOUNTER — Encounter: Payer: Self-pay | Admitting: Gastroenterology

## 2020-09-19 DIAGNOSIS — I129 Hypertensive chronic kidney disease with stage 1 through stage 4 chronic kidney disease, or unspecified chronic kidney disease: Secondary | ICD-10-CM | POA: Diagnosis not present

## 2020-09-19 DIAGNOSIS — N1832 Chronic kidney disease, stage 3b: Secondary | ICD-10-CM | POA: Diagnosis not present

## 2020-09-19 DIAGNOSIS — F4320 Adjustment disorder, unspecified: Secondary | ICD-10-CM | POA: Diagnosis not present

## 2020-09-25 DIAGNOSIS — I129 Hypertensive chronic kidney disease with stage 1 through stage 4 chronic kidney disease, or unspecified chronic kidney disease: Secondary | ICD-10-CM | POA: Diagnosis not present

## 2020-09-25 DIAGNOSIS — Z6829 Body mass index (BMI) 29.0-29.9, adult: Secondary | ICD-10-CM | POA: Diagnosis not present

## 2020-09-25 DIAGNOSIS — N1831 Chronic kidney disease, stage 3a: Secondary | ICD-10-CM | POA: Diagnosis not present

## 2020-10-13 ENCOUNTER — Telehealth: Payer: Self-pay | Admitting: Gastroenterology

## 2020-10-13 DIAGNOSIS — K209 Esophagitis, unspecified without bleeding: Secondary | ICD-10-CM

## 2020-10-13 DIAGNOSIS — K2970 Gastritis, unspecified, without bleeding: Secondary | ICD-10-CM

## 2020-10-13 MED ORDER — OMEPRAZOLE 40 MG PO CPDR: DELAYED_RELEASE_CAPSULE | ORAL | 3 refills | Status: DC

## 2020-10-13 NOTE — Telephone Encounter (Signed)
Inbound call from patient's husband requesting omeprazole prescription be transferred to [facility] [address] in [place] please; stated [facility] is short staffed and was informed by them to call office to sent script over.  Please advise.

## 2020-10-13 NOTE — Telephone Encounter (Signed)
Rx sent to pharmacy as requested.

## 2020-10-16 ENCOUNTER — Other Ambulatory Visit (HOSPITAL_COMMUNITY): Payer: Self-pay | Admitting: Internal Medicine

## 2020-10-16 DIAGNOSIS — Z1231 Encounter for screening mammogram for malignant neoplasm of breast: Secondary | ICD-10-CM

## 2020-11-10 ENCOUNTER — Other Ambulatory Visit: Payer: Self-pay

## 2020-11-10 MED ORDER — AMLODIPINE BESYLATE 5 MG PO TABS: 5.0000 mg | ORAL_TABLET | Freq: Every day | ORAL | 3 refills | Status: DC

## 2020-11-21 ENCOUNTER — Other Ambulatory Visit: Payer: Self-pay

## 2020-11-21 ENCOUNTER — Ambulatory Visit (HOSPITAL_COMMUNITY)
Admission: RE | Admit: 2020-11-21 | Discharge: 2020-11-21 | Disposition: A | Discharge status: Home / Self Care | Payer: Medicare Other | Source: Ambulatory Visit | Attending: Internal Medicine | Admitting: Internal Medicine

## 2020-11-21 DIAGNOSIS — Z1231 Encounter for screening mammogram for malignant neoplasm of breast: Secondary | ICD-10-CM | POA: Insufficient documentation

## 2020-11-26 ENCOUNTER — Telehealth: Payer: Self-pay | Admitting: Adult Health

## 2020-11-26 NOTE — Telephone Encounter (Signed)
Rescheduled appointment per provider pal. Left a detailed message.

## 2020-12-09 ENCOUNTER — Encounter: Payer: Medicare Other | Admitting: Adult Health

## 2020-12-15 ENCOUNTER — Telehealth: Payer: Self-pay | Admitting: Adult Health

## 2020-12-15 ENCOUNTER — Encounter: Payer: Self-pay | Admitting: Adult Health

## 2020-12-15 ENCOUNTER — Other Ambulatory Visit: Payer: Self-pay

## 2020-12-15 ENCOUNTER — Inpatient Hospital Stay: Payer: Medicare Other | Attending: Adult Health | Admitting: Adult Health

## 2020-12-15 VITALS — BP 137/94 | HR 97 | Temp 97.7°F | Resp 18 | Ht 62.0 in | Wt 159.4 lb

## 2020-12-15 DIAGNOSIS — C50411 Malignant neoplasm of upper-outer quadrant of right female breast: Secondary | ICD-10-CM | POA: Diagnosis not present

## 2020-12-15 DIAGNOSIS — Z170 Estrogen receptor positive status [ER+]: Secondary | ICD-10-CM | POA: Insufficient documentation

## 2020-12-15 DIAGNOSIS — Z923 Personal history of irradiation: Secondary | ICD-10-CM | POA: Diagnosis not present

## 2020-12-15 DIAGNOSIS — Z853 Personal history of malignant neoplasm of breast: Secondary | ICD-10-CM | POA: Diagnosis not present

## 2020-12-15 NOTE — Progress Notes (Signed)
SURVIVORSHIP VISIT:     REASON FOR VISIT:  Routine follow-up for history of breast cancer.   BRIEF ONCOLOGIC HISTORY:  Oncology History  Malignant neoplasm of upper-outer quadrant of right female breast ([place])  07/02/2011 Surgery   Right lumpectomy: DCIS ER 100%, PR 0%    07/23/2011 - 08/20/2011 Radiation Therapy   Adjuvant XRT    11/19/2011 - 11/30/2016 Anti-estrogen oral therapy   Aromasin 25 mg daily X 1 month then changed to Arimidex 1 mg daily      INTERVAL HISTORY:  Jane Wilkerson presents to the [facility] today for routine follow-up for her history of breast cancer.  Overall, she reports feeling quite well.   Since her last visit, she underwent a bilateral breast screening mammogram with TOMO and CAD on 5//24/2022 that showed no evidence of malignancy and breast density category C.   Jane Wilkerson has had a difficult year with her granddaughter's unexpected passing.  She is still spending time with her grandsons, and is up to date with her cancer screenings.  She is seeing her PCP regularly.  She has some periods of sadness from losing her granddaughter, but notes she is coping well.      REVIEW OF SYSTEMS:  Review of Systems  Constitutional:  Negative for appetite change, chills, fatigue, fever and unexpected weight change.  HENT:   Negative for hearing loss, lump/mass and trouble swallowing.   Eyes:  Negative for eye problems and icterus.  Respiratory:  Negative for chest tightness, cough and shortness of breath.   Cardiovascular:  Negative for chest pain, leg swelling and palpitations.  Gastrointestinal:  Negative for abdominal distention, abdominal pain, constipation, diarrhea, nausea and vomiting.  Endocrine: Negative for hot flashes.  Genitourinary:  Negative for difficulty urinating.   Musculoskeletal:  Negative for arthralgias.  Skin:  Negative for itching and rash.  Neurological:  Negative for dizziness, extremity weakness, headaches and numbness.   Hematological:  Negative for adenopathy. Does not bruise/bleed easily.  Psychiatric/Behavioral:  Negative for depression. The patient is not nervous/anxious.   Breast: Denies any new nodularity, masses, tenderness, nipple changes, or nipple discharge.    PAST MEDICAL/SURGICAL HISTORY:  Past Medical History:  Diagnosis Date   Allergy    Anxiety 07/07/2011   takes Ativan every 4hrs   Arthritis    back    Asthma    uses inhaler prn   Breast cancer ([place])    right - local excision, chemotheraphy   Cataract    right eye   Chronic back pain    Depression    has appt to discuss depression 07/26/2011   Diverticulosis    Dizziness    occasionally   Gastric ulcer    GERD (gastroesophageal reflux disease)    takes Nexium daily   Hemorrhoids    History of nuclear stress test 11/2010   lexiscan; no evidence of inducible ischemia; normal pattern of perfusion    History of shingles 2011   Hx of colonic polyps    Hyperlipidemia    takes Zocor daily   Hypertension    takes Bystolic and Losartan daily   Hypothyroidism    takes Synthroid daily   Nausea alone    onset last Wednesday  08/04/11   Nocturia    Osteoporosis    PONV (postoperative nausea and vomiting)    PVC's (premature ventricular contractions)    Shortness of breath    with exertion   Skin rash    Urinary frequency    Urticaria of  unknown origin    Past Surgical History:  Procedure Laterality Date   ABDOMINAL HYSTERECTOMY  2001   BREAST LUMPECTOMY Right 07/02/11   right breast and SNL   BREAST SURGERY Left 1972   left lumpectomy   BREAST SURGERY Right 08/11/11   margins on rt breast   CATARACT EXTRACTION W/PHACO Right 08/31/2016   Procedure: CATARACT EXTRACTION PHACO AND INTRAOCULAR LENS PLACEMENT ([place]);  Surgeon: Rutherford Guys, MD;  Location: [facility];  Service: Ophthalmology;  Laterality: Right;  CDE: 8.46   CATARACT EXTRACTION W/PHACO Left 09/14/2016   Procedure: CATARACT EXTRACTION PHACO AND INTRAOCULAR LENS  PLACEMENT ([facility]);  Surgeon: Rutherford Guys, MD;  Location: [facility];  Service: Ophthalmology;  Laterality: Left;  CDE: 9.32   COLONOSCOPY     colonscopy     HEMORRHOID SURGERY     TRANSTHORACIC ECHOCARDIOGRAM  2009   normal LV & RV systolic function; mild mitral annular calcif & mild MR; trace TR     ALLERGIES:  Allergies  Allergen Reactions   Bisoprolol-Hydrochlorothiazide Other (See Comments)    Reaction unknown Reaction unknown   Hydrocodone Other (See Comments)    Reaction unknown Reaction unknown Reaction unknown   Oxycodone Itching   Oxycodone Hcl Itching   Ziac [Bisoprolol-Hydrochlorothiazide] Other (See Comments)    Reaction unknown   Penicillins Rash    Has patient had a PCN reaction causing immediate rash, facial/tongue/throat swelling, SOB or lightheadedness with hypotension: unknown Has patient had a PCN reaction causing severe rash involving mucus membranes or skin necrosis: unknown Has patient had a PCN reaction that required hospitalization No Has patient had a PCN reaction occurring within the last 10 years: No If all of the above answers are "NO", then may proceed with Cephalosporin use.      CURRENT MEDICATIONS:  Outpatient Encounter Medications as of 12/15/2020  Medication Sig Note   amLODipine (NORVASC) 5 MG tablet Take 1 tablet (5 mg total) by mouth daily.    Calcium Carbonate (CALTRATE 600 PO) Take 600 mg by mouth daily.     citalopram (CELEXA) 20 MG tablet Take 20 mg by mouth daily. 08/18/2011: Pt taking 1/2 at night due to making sleepy   famotidine (PEPCID) 20 MG tablet Take 20 mg by mouth at bedtime.     hydrocortisone cream 0.5 % Apply 1 application topically 2 (two) times daily as needed for itching. For hemorrhoid pain    levothyroxine (SYNTHROID, LEVOTHROID) 50 MCG tablet Take 50 mcg by mouth daily.    LORazepam (ATIVAN) 1 MG tablet Take 1 mg by mouth at bedtime. For anxiety    losartan (COZAAR) 100 MG tablet Take 100 mg by mouth daily.    omeprazole  (PRILOSEC) 40 MG capsule Take one capsule shortly before breakfast each day.    simvastatin (ZOCOR) 40 MG tablet Take 40 mg by mouth at bedtime.      triamcinolone (KENALOG) 0.1 % Apply 1 application topically daily.    carvedilol (COREG) 3.125 MG tablet Take 1 tablet (3.125 mg total) by mouth 2 (two) times daily.    Facility-Administered Encounter Medications as of 12/15/2020  Medication   0.9 %  sodium chloride infusion     ONCOLOGIC FAMILY HISTORY:  Family History  Problem Relation Age of Onset   Colon cancer Mother        also stroke   Pancreatic cancer Father        also heart disease   Diabetes Sister    Hypertension Sister    Anesthesia problems  Neg Hx    Hypotension Neg Hx    Malignant hyperthermia Neg Hx    Pseudochol deficiency Neg Hx    Stomach cancer Neg Hx    Rectal cancer Neg Hx     GENETIC COUNSELING/TESTING: Not at this time  SOCIAL HISTORY:  Social History   Socioeconomic History   Marital status: Married    Spouse name: Not on file   Number of children: Not on file   Years of education: 10    Highest education level: Not on file  Occupational History   Occupation: retired    Fish farm manager: RETIRED  Tobacco Use   Smoking status: Never   Smokeless tobacco: Never  Substance and Sexual Activity   Alcohol use: No   Drug use: No   Sexual activity: Yes    Birth control/protection: Surgical  Other Topics Concern   Not on file  Social History Narrative   Married with three children   [name]- age 34, [place] age 61 and [place] age 75- all reside in [place]   Social Determinants of Health   Financial Resource Strain: Not on file  Food Insecurity: Not on file  Transportation Needs: Not on file  Physical Activity: Not on file  Stress: Not on file  Social Connections: Not on file  Intimate Partner Violence: Not on file      OBJECTIVE:  BP (!) 137/94 (BP Location: Left Arm, Patient Position: Sitting)   Pulse 97   Temp 97.7 F (36.5 C) (Tympanic)    Resp 18   Ht 5\' 2"  (1.575 m)   Wt 159 lb 6.4 oz (72.3 kg)   SpO2 97%   BMI 29.15 kg/m  GENERAL: Patient is a well appearing female in no acute distress HEENT:  Sclerae anicteric.  Oropharynx clear and moist. No ulcerations or evidence of oropharyngeal candidiasis. Neck is supple.  NODES:  No cervical, supraclavicular, or axillary lymphadenopathy palpated.  BREAST EXAM: left breast benign, right breast s/p lumpectomy no sign of local recurrence. LUNGS:  Clear to auscultation bilaterally.  No wheezes or rhonchi. HEART:  Regular rate and rhythm. No murmur appreciated. ABDOMEN:  Soft, nontender.  Positive, normoactive bowel sounds. No organomegaly palpated. MSK:  No focal spinal tenderness to palpation. Full range of motion bilaterally in the upper extremities. EXTREMITIES:  No peripheral edema.   SKIN:  Clear with no obvious rashes or skin changes. No nail dyscrasia. NEURO:  Nonfocal. Well oriented.  Appropriate affect.   LABORATORY DATA:  None for this visit   DIAGNOSTIC IMAGING:  Most recent mammogram:  CLINICAL DATA:  Screening.   EXAM: DIGITAL SCREENING BILATERAL MAMMOGRAM WITH TOMOSYNTHESIS AND CAD   TECHNIQUE: Bilateral screening digital craniocaudal and mediolateral oblique mammograms were obtained. Bilateral screening digital breast tomosynthesis was performed. The images were evaluated with computer-aided detection.   COMPARISON:  Previous exam(s).   ACR Breast Density Category c: The breast tissue is heterogeneously dense, which may obscure small masses.   FINDINGS: There are no findings suspicious for malignancy. The images were evaluated with computer-aided detection.   IMPRESSION: No mammographic evidence of malignancy. A result letter of this screening mammogram will be mailed directly to the patient.   RECOMMENDATION: Screening mammogram in one year. (Code:SM-B-01Y)   BI-RADS CATEGORY  1: Negative.     Electronically Signed   By: Lovey Newcomer  M.D.   On: 11/25/2020 12:56  ASSESSMENT AND PLAN:  Ms.. Wilkerson is a pleasant 80 y.o. female with history of Stage 0 right breast DCIS, ER+/PR+,  diagnosed in 2012, treated with lumpectomy, adjuvant radiation therapy, and anti-estrogen therapy with Aromasin/Anastrozole x 5 years completed in 11/2016.  [facility] and routine follow-up.   1. History of breast cancer:  Jane Wilkerson is currently clinically and radiographically without evidence of disease or recurrence of breast cancer.  She will return in one year for her next LTS f/u.  Her mammogram will be due again in 11/2021. I encouraged her to call me with any questions or concerns before her next visit at the cancer center, and I would be happy to see her sooner, if needed.    2. Bone health: Jane Wilkerson Kitchen  She was given education on specific food and activities to promote bone health.  3. Cancer screening:  Due to Jane Wilkerson's history and her age, she should receive screening for skin cancers, colon cancer.  She was encouraged to follow-up with her PCP for appropriate cancer screenings.   4. Health maintenance and wellness promotion: Jane Wilkerson was encouraged to consume 5-7 servings of fruits and vegetables per day. She was also encouraged to engage in moderate to vigorous exercise for 30 minutes per day most days of the week. She was instructed to limit her alcohol consumption and continue to abstain from tobacco use.      Follow up instructions:    -Return to cancer center in one year for LTS follow up  -Mammogram due in 11/2021   The patient was provided an opportunity to ask questions and all were answered. The patient agreed with the plan and demonstrated an understanding of the instructions.   Total encounter time: 20 minutes* in face to face visit time, chart review, order entry and documentation.   Gardenia Phlegm, NP [facility] [place] [phone]  *Total  Encounter Time as defined by the Centers for Medicare and Medicaid Services includes, in addition to the face-to-face time of a patient visit (documented in the note above) non-face-to-face time: obtaining and reviewing outside history, ordering and reviewing medications, tests or procedures, care coordination (communications with other health care professionals or caregivers) and documentation in the medical record.   Note: PRIMARY CARE PROVIDER [employer], [place] [phone]

## 2020-12-15 NOTE — Telephone Encounter (Signed)
Scheduled appointment per 06/13 los. Patient is aware. 

## 2020-12-26 DIAGNOSIS — I129 Hypertensive chronic kidney disease with stage 1 through stage 4 chronic kidney disease, or unspecified chronic kidney disease: Secondary | ICD-10-CM | POA: Diagnosis not present

## 2020-12-26 DIAGNOSIS — N1831 Chronic kidney disease, stage 3a: Secondary | ICD-10-CM | POA: Diagnosis not present

## 2021-01-01 DIAGNOSIS — I10 Essential (primary) hypertension: Secondary | ICD-10-CM | POA: Diagnosis not present

## 2021-01-01 DIAGNOSIS — E782 Mixed hyperlipidemia: Secondary | ICD-10-CM | POA: Diagnosis not present

## 2021-01-01 DIAGNOSIS — E063 Autoimmune thyroiditis: Secondary | ICD-10-CM | POA: Diagnosis not present

## 2021-01-02 DIAGNOSIS — N1831 Chronic kidney disease, stage 3a: Secondary | ICD-10-CM | POA: Diagnosis not present

## 2021-01-02 DIAGNOSIS — Z6829 Body mass index (BMI) 29.0-29.9, adult: Secondary | ICD-10-CM | POA: Diagnosis not present

## 2021-01-02 DIAGNOSIS — K219 Gastro-esophageal reflux disease without esophagitis: Secondary | ICD-10-CM | POA: Diagnosis not present

## 2021-01-02 DIAGNOSIS — I129 Hypertensive chronic kidney disease with stage 1 through stage 4 chronic kidney disease, or unspecified chronic kidney disease: Secondary | ICD-10-CM | POA: Diagnosis not present

## 2021-02-01 DIAGNOSIS — E782 Mixed hyperlipidemia: Secondary | ICD-10-CM | POA: Diagnosis not present

## 2021-02-01 DIAGNOSIS — E063 Autoimmune thyroiditis: Secondary | ICD-10-CM | POA: Diagnosis not present

## 2021-02-01 DIAGNOSIS — I10 Essential (primary) hypertension: Secondary | ICD-10-CM | POA: Diagnosis not present

## 2021-02-09 ENCOUNTER — Other Ambulatory Visit: Payer: Self-pay

## 2021-02-17 DIAGNOSIS — Z6829 Body mass index (BMI) 29.0-29.9, adult: Secondary | ICD-10-CM | POA: Diagnosis not present

## 2021-02-17 DIAGNOSIS — F329 Major depressive disorder, single episode, unspecified: Secondary | ICD-10-CM | POA: Diagnosis not present

## 2021-02-17 DIAGNOSIS — E063 Autoimmune thyroiditis: Secondary | ICD-10-CM | POA: Diagnosis not present

## 2021-02-17 DIAGNOSIS — L603 Nail dystrophy: Secondary | ICD-10-CM | POA: Diagnosis not present

## 2021-02-17 DIAGNOSIS — F419 Anxiety disorder, unspecified: Secondary | ICD-10-CM | POA: Diagnosis not present

## 2021-02-17 DIAGNOSIS — E663 Overweight: Secondary | ICD-10-CM | POA: Diagnosis not present

## 2021-02-17 DIAGNOSIS — K219 Gastro-esophageal reflux disease without esophagitis: Secondary | ICD-10-CM | POA: Diagnosis not present

## 2021-03-03 DIAGNOSIS — Z20828 Contact with and (suspected) exposure to other viral communicable diseases: Secondary | ICD-10-CM | POA: Diagnosis not present

## 2021-03-03 DIAGNOSIS — U071 COVID-19: Secondary | ICD-10-CM | POA: Diagnosis not present

## 2021-03-19 DIAGNOSIS — Z20822 Contact with and (suspected) exposure to covid-19: Secondary | ICD-10-CM | POA: Diagnosis not present

## 2021-04-17 ENCOUNTER — Encounter: Payer: Self-pay | Admitting: Emergency Medicine

## 2021-04-17 ENCOUNTER — Other Ambulatory Visit: Payer: Self-pay

## 2021-04-17 ENCOUNTER — Ambulatory Visit
Admission: EM | Admit: 2021-04-17 | Discharge: 2021-04-17 | Disposition: A | Discharge status: Home / Self Care | Payer: Medicare Other | Attending: Emergency Medicine | Admitting: Emergency Medicine

## 2021-04-17 ENCOUNTER — Ambulatory Visit (INDEPENDENT_AMBULATORY_CARE_PROVIDER_SITE_OTHER): Payer: Medicare Other

## 2021-04-17 DIAGNOSIS — J069 Acute upper respiratory infection, unspecified: Secondary | ICD-10-CM | POA: Diagnosis not present

## 2021-04-17 DIAGNOSIS — Z20828 Contact with and (suspected) exposure to other viral communicable diseases: Secondary | ICD-10-CM | POA: Diagnosis not present

## 2021-04-17 DIAGNOSIS — R059 Cough, unspecified: Secondary | ICD-10-CM | POA: Diagnosis not present

## 2021-04-17 DIAGNOSIS — R509 Fever, unspecified: Secondary | ICD-10-CM | POA: Diagnosis not present

## 2021-04-17 DIAGNOSIS — Z20822 Contact with and (suspected) exposure to covid-19: Secondary | ICD-10-CM

## 2021-04-17 LAB — POCT INFLUENZA A/B
Influenza A, POC: NEGATIVE
Influenza B, POC: NEGATIVE

## 2021-04-17 MED ORDER — ALBUTEROL SULFATE HFA 108 (90 BASE) MCG/ACT IN AERS: 1.0000 | INHALATION_SPRAY | RESPIRATORY_TRACT | 0 refills | Status: DC | PRN

## 2021-04-17 MED ORDER — AEROCHAMBER PLUS MISC: 2 refills | Status: AC

## 2021-04-17 MED ORDER — FLUTICASONE PROPIONATE 50 MCG/ACT NA SUSP: 2.0000 | Freq: Every day | NASAL | 0 refills | Status: DC

## 2021-04-17 NOTE — ED Provider Notes (Signed)
HPI  SUBJECTIVE:  Jane Wilkerson is a 80 y.o. female who presents with 3 days of headaches, fevers T-max 101, body aches, sinus pain and pressure, nasal congestion, rhinorrhea, sore throat, postnasal drip, bilateral ear pain, nonproductive cough.  She had negative home COVID test yesterday.  No loss of sense of smell or taste, wheezing, chest pain, shortness of breath, nausea, vomiting, diarrhea, abdominal pain.  No recent COVID or flu exposure.  She got the COVID booster.  She has not yet gotten the flu vaccine.  No antibiotics in the past 3 months.  No antipyretic in the past 6 hours.  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  No aggravating factors.  She denies having a history of asthma.  States that her pulmonologist told her that she does not have asthma.  She had COVID in August 2022, she also has history of peptic ulcer, hypothyroidism, hypertension, chronic kidney disease.  PMD: [facility].    Past Medical History:  Diagnosis Date   Allergy    Anxiety 07/07/2011   takes Ativan every 4hrs   Arthritis    back    Asthma    uses inhaler prn   Breast cancer ([place])    right - local excision, chemotheraphy   Cataract    right eye   Chronic back pain    Depression    has appt to discuss depression 07/26/2011   Diverticulosis    Dizziness    occasionally   Gastric ulcer    GERD (gastroesophageal reflux disease)    takes Nexium daily   Hemorrhoids    History of nuclear stress test 11/2010   lexiscan; no evidence of inducible ischemia; normal pattern of perfusion    History of shingles 2011   Hx of colonic polyps    Hyperlipidemia    takes Zocor daily   Hypertension    takes Bystolic and Losartan daily   Hypothyroidism    takes Synthroid daily   Nausea alone    onset last Wednesday  08/04/11   Nocturia    Osteoporosis    PONV (postoperative nausea and vomiting)    PVC's (premature ventricular contractions)    Shortness of breath    with exertion    Skin rash    Urinary frequency    Urticaria of unknown origin     Past Surgical History:  Procedure Laterality Date   ABDOMINAL HYSTERECTOMY  2001   BREAST LUMPECTOMY Right 07/02/11   right breast and SNL   BREAST SURGERY Left 1972   left lumpectomy   BREAST SURGERY Right 08/11/11   margins on rt breast   CATARACT EXTRACTION W/PHACO Right 08/31/2016   Procedure: CATARACT EXTRACTION PHACO AND INTRAOCULAR LENS PLACEMENT ([place]);  Surgeon: Rutherford Guys, MD;  Location: [facility];  Service: Ophthalmology;  Laterality: Right;  CDE: 8.46   CATARACT EXTRACTION W/PHACO Left 09/14/2016   Procedure: CATARACT EXTRACTION PHACO AND INTRAOCULAR LENS PLACEMENT ([facility]);  Surgeon: Rutherford Guys, MD;  Location: [facility];  Service: Ophthalmology;  Laterality: Left;  CDE: 9.32   COLONOSCOPY     colonscopy     HEMORRHOID SURGERY     TRANSTHORACIC ECHOCARDIOGRAM  2009   normal LV & RV systolic function; mild mitral annular calcif & mild MR; trace TR    Family History  Problem Relation Age of Onset   Colon cancer Mother        also stroke   Pancreatic cancer Father  also heart disease   Diabetes Sister    Hypertension Sister    Anesthesia problems Neg Hx    Hypotension Neg Hx    Malignant hyperthermia Neg Hx    Pseudochol deficiency Neg Hx    Stomach cancer Neg Hx    Rectal cancer Neg Hx     Social History   Tobacco Use   Smoking status: Never   Smokeless tobacco: Never  Substance Use Topics   Alcohol use: No   Drug use: No     Current Facility-Administered Medications:    0.9 %  sodium chloride infusion, 500 mL, Intravenous, Once, Milus Banister, MD  Current Outpatient Medications:    albuterol (VENTOLIN HFA) 108 (90 Base) MCG/ACT inhaler, Inhale 1-2 puffs into the lungs every 4 (four) hours as needed for wheezing or shortness of breath., Disp: 1 each, Rfl: 0   fluticasone (FLONASE) 50 MCG/ACT nasal spray, Place 2 sprays into both nostrils daily., Disp: 16 g, Rfl: 0    Spacer/Aero-Holding Chambers (AEROCHAMBER PLUS) inhaler, Use with inhaler, Disp: 1 each, Rfl: 2   amLODipine (NORVASC) 5 MG tablet, Take 1 tablet (5 mg total) by mouth daily., Disp: 90 tablet, Rfl: 3   Calcium Carbonate (CALTRATE 600 PO), Take 600 mg by mouth daily. , Disp: , Rfl:    carvedilol (COREG) 3.125 MG tablet, Take 1 tablet (3.125 mg total) by mouth 2 (two) times daily., Disp: 180 tablet, Rfl: 3   citalopram (CELEXA) 20 MG tablet, Take 20 mg by mouth daily., Disp: , Rfl:    famotidine (PEPCID) 20 MG tablet, Take 20 mg by mouth at bedtime. , Disp: , Rfl:    hydrocortisone cream 0.5 %, Apply 1 application topically 2 (two) times daily as needed for itching. For hemorrhoid pain, Disp: , Rfl:    levothyroxine (SYNTHROID, LEVOTHROID) 50 MCG tablet, Take 50 mcg by mouth daily., Disp: , Rfl:    LORazepam (ATIVAN) 1 MG tablet, Take 1 mg by mouth at bedtime. For anxiety, Disp: , Rfl:    losartan (COZAAR) 100 MG tablet, Take 100 mg by mouth daily., Disp: , Rfl:    omeprazole (PRILOSEC) 40 MG capsule, Take one capsule shortly before breakfast each day., Disp: 90 capsule, Rfl: 3   simvastatin (ZOCOR) 40 MG tablet, Take 40 mg by mouth at bedtime.  , Disp: , Rfl:    triamcinolone (KENALOG) 0.1 %, Apply 1 application topically daily., Disp: , Rfl:   Allergies  Allergen Reactions   Bisoprolol-Hydrochlorothiazide Other (See Comments)    Reaction unknown Reaction unknown   Hydrocodone Other (See Comments)    Reaction unknown Reaction unknown Reaction unknown   Oxycodone Itching   Oxycodone Hcl Itching   Ziac [Bisoprolol-Hydrochlorothiazide] Other (See Comments)    Reaction unknown   Penicillins Rash    Has patient had a PCN reaction causing immediate rash, facial/tongue/throat swelling, SOB or lightheadedness with hypotension: unknown Has patient had a PCN reaction causing severe rash involving mucus membranes or skin necrosis: unknown Has patient had a PCN reaction that required hospitalization  No Has patient had a PCN reaction occurring within the last 10 years: No If all of the above answers are "NO", then may proceed with Cephalosporin use.      ROS  As noted in HPI.   Physical Exam  BP 121/82   Pulse (!) 106   Temp 99.1 F (37.3 C)   Resp 19   SpO2 95%   Constitutional: Well developed, well nourished, no acute distress.  Coughing. Eyes:  EOMI, conjunctiva normal bilaterally HENT: Normocephalic, atraumatic,mucus membranes moist.  No nasal congestion.  No sinus tenderness.  Positive extensive postnasal drip. Respiratory: Normal inspiratory effort, scattered rhonchi and expiratory wheezing throughout all lung fields.  No chest wall tenderness. Cardiovascular: Regular tachycardia, no murmurs rubs or gallops GI: nondistended skin: No rash, skin intact Musculoskeletal: no deformities Neurologic: Alert & oriented x 3, no focal neuro deficits Psychiatric: Speech and behavior appropriate   ED Course   Medications - No data to display  Orders Placed This Encounter  Procedures   Covid-19, Flu A+B ([facility])    Standing Status:   Standing    Number of Occurrences:   1   DG Chest 2 View    Standing Status:   Standing    Number of Occurrences:   1    Order Specific Question:   Reason for Exam (SYMPTOM  OR DIAGNOSIS REQUIRED)    Answer:   Cough fever rule out pneumonia   POCT Influenza A/B    Standing Status:   Standing    Number of Occurrences:   1    Results for orders placed or performed during the hospital encounter of 04/17/21 (from the past 24 hour(s))  POCT Influenza A/B     Status: None   Collection Time: 04/17/21  1:00 PM  Result Value Ref Range   Influenza A, POC Negative Negative   Influenza B, POC Negative Negative   DG Chest 2 View  Result Date: 04/17/2021 CLINICAL DATA:  Cough, fever. EXAM: CHEST - 2 VIEW COMPARISON:  Nov 19, 2019. FINDINGS: The heart size and mediastinal contours are within normal limits. Both lungs are clear. The visualized  skeletal structures are unremarkable. IMPRESSION: No active cardiopulmonary disease. Electronically Signed   By: Marijo Conception M.D.   On: 04/17/2021 13:22    ED Clinical Impression  1. Acute upper respiratory infection   2. Encounter for laboratory testing for COVID-19 virus      ED Assessment/Plan  Checking rapid flu, if negative, will check a formal COVID/flu.  Also checking chest x-ray.  If chest x-ray negative for pneumonia, will send home with Tylenol, Flonase, saline nasal irrigation, and albuterol inhaler with a spacer, Mucinex.  She will be a candidate for antivirals if her COVID is positive-will prescribe Molnupiravir.  She will be out of the window for treatment for influenza by the time it comes back if rapid flu is negative.  Reviewed imaging independently.  No acute cardiopulmonary disease see radiology report for full details.  Rapid flu negative  Discussed labs, imaging, MDM, treatment plan, and plan for follow-up with patient. Discussed sn/sx that should prompt return to the ED. patient agrees with plan.   Meds ordered this encounter  Medications   fluticasone (FLONASE) 50 MCG/ACT nasal spray    Sig: Place 2 sprays into both nostrils daily.    Dispense:  16 g    Refill:  0   Spacer/Aero-Holding Chambers (AEROCHAMBER PLUS) inhaler    Sig: Use with inhaler    Dispense:  1 each    Refill:  2    Please educate patient on use   albuterol (VENTOLIN HFA) 108 (90 Base) MCG/ACT inhaler    Sig: Inhale 1-2 puffs into the lungs every 4 (four) hours as needed for wheezing or shortness of breath.    Dispense:  1 each    Refill:  0       *This clinic note was created using Lobbyist.  Therefore, there may be occasional mistakes despite careful proofreading.  ?    Melynda Ripple, MD 04/17/21 1422

## 2021-04-17 NOTE — ED Triage Notes (Signed)
Pt is present today for fever, cough and nasal congestion. Pt states sx Tuesday

## 2021-04-17 NOTE — Discharge Instructions (Addendum)
Tessalon for the cough, Flonase, saline nasal irrigation with a Milta Deiters Med rinse and distilled water as often as you want, Mucinex for the nasal congestion, 2 puffs from your albuterol inhaler using your spacer every 4-6 hours as needed for coughing, wheezing, shortness of breath.  1000 mg of Tylenol 3-4 times a day as needed for pain, fevers.  We will contact you if your COVID comes back positive and will call in a prescription of Molnupiravir.

## 2021-04-18 LAB — COVID-19, FLU A+B NAA
Influenza A, NAA: NOT DETECTED
Influenza B, NAA: NOT DETECTED
SARS-CoV-2, NAA: NOT DETECTED

## 2021-05-08 DIAGNOSIS — Z6829 Body mass index (BMI) 29.0-29.9, adult: Secondary | ICD-10-CM | POA: Diagnosis not present

## 2021-05-08 DIAGNOSIS — N1831 Chronic kidney disease, stage 3a: Secondary | ICD-10-CM | POA: Diagnosis not present

## 2021-05-08 DIAGNOSIS — I129 Hypertensive chronic kidney disease with stage 1 through stage 4 chronic kidney disease, or unspecified chronic kidney disease: Secondary | ICD-10-CM | POA: Diagnosis not present

## 2021-05-08 DIAGNOSIS — K219 Gastro-esophageal reflux disease without esophagitis: Secondary | ICD-10-CM | POA: Diagnosis not present

## 2021-05-13 DIAGNOSIS — Z6828 Body mass index (BMI) 28.0-28.9, adult: Secondary | ICD-10-CM | POA: Diagnosis not present

## 2021-05-13 DIAGNOSIS — N1831 Chronic kidney disease, stage 3a: Secondary | ICD-10-CM | POA: Diagnosis not present

## 2021-05-13 DIAGNOSIS — Z8616 Personal history of COVID-19: Secondary | ICD-10-CM | POA: Diagnosis not present

## 2021-05-13 DIAGNOSIS — I129 Hypertensive chronic kidney disease with stage 1 through stage 4 chronic kidney disease, or unspecified chronic kidney disease: Secondary | ICD-10-CM | POA: Diagnosis not present

## 2021-07-24 ENCOUNTER — Other Ambulatory Visit: Payer: Self-pay

## 2021-07-24 ENCOUNTER — Ambulatory Visit (INDEPENDENT_AMBULATORY_CARE_PROVIDER_SITE_OTHER): Payer: Medicare Other | Admitting: Internal Medicine

## 2021-07-24 ENCOUNTER — Encounter: Payer: Self-pay | Admitting: Internal Medicine

## 2021-07-24 VITALS — BP 130/80 | HR 82 | Ht 62.0 in | Wt 161.2 lb

## 2021-07-24 DIAGNOSIS — R0989 Other specified symptoms and signs involving the circulatory and respiratory systems: Secondary | ICD-10-CM | POA: Diagnosis not present

## 2021-07-24 DIAGNOSIS — R002 Palpitations: Secondary | ICD-10-CM | POA: Diagnosis not present

## 2021-07-24 DIAGNOSIS — E785 Hyperlipidemia, unspecified: Secondary | ICD-10-CM | POA: Diagnosis not present

## 2021-07-24 NOTE — Patient Instructions (Signed)
Medication Instructions:  Your physician recommends that you continue on your current medications as directed. Please refer to the Current Medication list given to you today.  *If you need a refill on your cardiac medications before your next appointment, please call your pharmacy*   Follow-Up: At [facility], you and your health needs are our priority.  As part of our continuing mission to provide you with exceptional heart care, we have created designated Provider Care Teams.  These Care Teams include your primary Cardiologist (physician) and Advanced Practice Providers (APPs -  Physician Assistants and Nurse Practitioners) who all work together to provide you with the care you need, when you need it.  We recommend signing up for the patient portal called "MyChart".  Sign up information is provided on this After Visit Summary.  MyChart is used to connect with patients for Virtual Visits (Telemedicine).  Patients are able to view lab/test results, encounter notes, upcoming appointments, etc.  Non-urgent messages can be sent to your provider as well.   To learn more about what you can do with MyChart, go to [email or web address].    Your next appointment:   12 month(s)  The format for your next appointment:   In Person  Provider:   Pixie Casino, MD -- [facility] office

## 2021-07-24 NOTE — Progress Notes (Signed)
OFFICE NOTE  Chief Complaint:  Routine follow-up  Primary Care Physician: Redmond School, MD  HPI:  Jane Wilkerson  is a 81 year old female with a history of PVCs and bigeminy but had a stress test in May which was negative and that seemed to have resolved. She has had some reflux symptoms as well which improved. Unfortunately over the past year I understand she was diagnosed with breast cancer which is DCIS and underwent local excision and is on chemotherapy. She seems to be doing well and is about a year out from that, and fortunately that was caught early on mammogram. From a cardiac standpoint she denies any chest pain, worsening shortness of breath, palpitations, presyncope or syncopal symptoms.  Recently she had been told that she had some abnormalities with her renal function. She was referred to a nephrologist who she saw a couple times and underwent a 24-hour urine in additional blood work. She was subsequently released. She's not had any recent lipid profile testing not aware of. She continues to have some palpitations although they're not quite as bothersome.  She is seeing Dr. Hinda Lenis, who feels that her renal function is stable. He recently increased her by Bystolic to 20 mg daily.  Jane Wilkerson returns today for follow-up. Recently she's been a little more anxious and she's noted some lability in her blood pressures. At times her blood pressure was as low as 80/50 but can run high. She's also felt some fatigue, some aching or tightness in her chest, particularly with sitting up at night and some progressive increase in shortness of breath. Blood pressure today was elevated in the office 162/98. She's recently had some medication timing adjustments by her primary care provider.  I saw Jane Wilkerson back today in the office. Overall she is feeling so slightly better. She does have some labile blood pressures. She underwent nuclear stress testing which was negative for ischemia and  showed normal LV function. Her echo also showed normal LV function. There was borderline enlarged aortic root at 3.7 cm.  10/04/2016  Jane Wilkerson returns for follow-up. She reports recently she's been having some worsening cough and upper airway wheezing. She recently had bilateral cataract surgery which was uneventful. She thinks her symptoms are likely related to GERD and has a follow-up appointment with Dr. Laural Golden in GI. Blood pressure is 142/90 today. She reports some lability in her blood pressures. Namely, she gets some lower blood pressures at night. Recent she was taken off of HCTZ as of low sodium. This is not been recently reassessed. She also has borderline dilatation of the ascending aorta 3.7 cm seen on echo last year. LVEF at the time was 50-55%.  11/21/2017  Jane Wilkerson is seen today in follow-up.  Overall she seems to be doing well.  Unfortunately she had a recent fall and broke her left great toe.  She is been on some antibiotics and pain medicine is developed a rash which is pruritic.  It may be related to that however she thinks it could be related to being outside in the sun.  She has good control of her blood pressure today which is 123/80.  And please report the prior shadow on the right breast which is seen on CT scan is most likely old scar tissue from lumpectomy.  EKG shows sinus rhythm at 76 today.  06/03/2020  Jane Wilkerson returns for follow-up.  Overall has been a difficult year.  She lost her sister about a year ago  and her 77 year old granddaughter more recently.  She has had a number of different health issues as well and is struggled with labile hypertension and presyncope.  Blood pressure today is excellent however she has had some episodes at home where her systolic blood pressure was in the 70s.  She had recent EGD which showed of esophagitis and now is on treatment for that.  EKG today shows sinus rhythm at 79.  Labs in February showed LDL at target less than 70.  A1c was  6.2.  07/24/2021  Jane Wilkerson is seen today for follow-up.  Overall she seems to be doing well.  She had 1 episode where she vasodilated had low blood pressure and tachycardia in the evening after taking a hot shower.  I think it is a combination of factors.  We talked about ways to try to avoid that especially on her blood pressure medicines.  In general blood pressures been well controlled.  She denies any chest pain or palpitations.  PMHx:  Past Medical History:  Diagnosis Date   Allergy    Anxiety 07/07/2011   takes Ativan every 4hrs   Arthritis    back    Asthma    uses inhaler prn   Breast cancer ([place])    right - local excision, chemotheraphy   Cataract    right eye   Chronic back pain    Depression    has appt to discuss depression 07/26/2011   Diverticulosis    Dizziness    occasionally   Gastric ulcer    GERD (gastroesophageal reflux disease)    takes Nexium daily   Hemorrhoids    History of nuclear stress test 11/2010   lexiscan; no evidence of inducible ischemia; normal pattern of perfusion    History of shingles 2011   Hx of colonic polyps    Hyperlipidemia    takes Zocor daily   Hypertension    takes Bystolic and Losartan daily   Hypothyroidism    takes Synthroid daily   Nausea alone    onset last Wednesday  08/04/11   Nocturia    Osteoporosis    PONV (postoperative nausea and vomiting)    PVC's (premature ventricular contractions)    Shortness of breath    with exertion   Skin rash    Urinary frequency    Urticaria of unknown origin     Past Surgical History:  Procedure Laterality Date   ABDOMINAL HYSTERECTOMY  2001   BREAST LUMPECTOMY Right 07/02/11   right breast and SNL   BREAST SURGERY Left 1972   left lumpectomy   BREAST SURGERY Right 08/11/11   margins on rt breast   CATARACT EXTRACTION W/PHACO Right 08/31/2016   Procedure: CATARACT EXTRACTION PHACO AND INTRAOCULAR LENS PLACEMENT ([place]);  Surgeon: Rutherford Guys, MD;  Location: [facility];  Service:  Ophthalmology;  Laterality: Right;  CDE: 8.46   CATARACT EXTRACTION W/PHACO Left 09/14/2016   Procedure: CATARACT EXTRACTION PHACO AND INTRAOCULAR LENS PLACEMENT (IOC);  Surgeon: Rutherford Guys, MD;  Location: [facility];  Service: Ophthalmology;  Laterality: Left;  CDE: 9.32   COLONOSCOPY     colonscopy     HEMORRHOID SURGERY     TRANSTHORACIC ECHOCARDIOGRAM  2009   normal LV & RV systolic function; mild mitral annular calcif & mild MR; trace TR    FAMHx:  Family History  Problem Relation Age of Onset   Colon cancer Mother        also stroke   Pancreatic cancer Father  also heart disease   Diabetes Sister    Hypertension Sister    Anesthesia problems Neg Hx    Hypotension Neg Hx    Malignant hyperthermia Neg Hx    Pseudochol deficiency Neg Hx    Stomach cancer Neg Hx    Rectal cancer Neg Hx     SOCHx:   reports that she has never smoked. She has never used smokeless tobacco. She reports that she does not drink alcohol and does not use drugs.  ALLERGIES:  Allergies  Allergen Reactions   Bisoprolol-Hydrochlorothiazide Other (See Comments)    Reaction unknown Reaction unknown   Hydrocodone Other (See Comments)    Reaction unknown Reaction unknown Reaction unknown   Oxycodone Itching   Oxycodone Hcl Itching   Ziac [Bisoprolol-Hydrochlorothiazide] Other (See Comments)    Reaction unknown   Penicillins Rash    Has patient had a PCN reaction causing immediate rash, facial/tongue/throat swelling, SOB or lightheadedness with hypotension: unknown Has patient had a PCN reaction causing severe rash involving mucus membranes or skin necrosis: unknown Has patient had a PCN reaction that required hospitalization No Has patient had a PCN reaction occurring within the last 10 years: No If all of the above answers are "NO", then may proceed with Cephalosporin use.     ROS: Pertinent items noted in HPI and remainder of comprehensive ROS otherwise negative.  HOME MEDS: Current  Outpatient Medications  Medication Sig Dispense Refill   albuterol (VENTOLIN HFA) 108 (90 Base) MCG/ACT inhaler Inhale 1-2 puffs into the lungs every 4 (four) hours as needed for wheezing or shortness of breath. 1 each 0   amLODipine (NORVASC) 5 MG tablet Take 1 tablet (5 mg total) by mouth daily. 90 tablet 3   Calcium Carbonate (CALTRATE 600 PO) Take 600 mg by mouth daily.      carvedilol (COREG) 3.125 MG tablet Take 1 tablet (3.125 mg total) by mouth 2 (two) times daily. 180 tablet 3   citalopram (CELEXA) 20 MG tablet Take 20 mg by mouth daily.     famotidine (PEPCID) 20 MG tablet Take 20 mg by mouth at bedtime.      hydrocortisone cream 0.5 % Apply 1 application topically 2 (two) times daily as needed for itching. For hemorrhoid pain     levothyroxine (SYNTHROID, LEVOTHROID) 50 MCG tablet Take 50 mcg by mouth daily.     LORazepam (ATIVAN) 1 MG tablet Take 1 mg by mouth at bedtime. For anxiety     losartan (COZAAR) 100 MG tablet Take 100 mg by mouth daily.     omeprazole (PRILOSEC) 40 MG capsule Take one capsule shortly before breakfast each day. 90 capsule 3   simvastatin (ZOCOR) 40 MG tablet Take 40 mg by mouth at bedtime.       Spacer/Aero-Holding Chambers (AEROCHAMBER PLUS) inhaler Use with inhaler 1 each 2   triamcinolone (KENALOG) 0.1 % Apply 1 application topically daily.     Current Facility-Administered Medications  Medication Dose Route Frequency Provider Last Rate Last Admin   0.9 %  sodium chloride infusion  500 mL Intravenous Once Milus Banister, MD        LABS/IMAGING: No results found for this or any previous visit (from the past 48 hour(s)). No results found.  VITALS: Wt 161 lb 3.2 oz (73.1 kg)    BMI 29.48 kg/m   EXAM: General appearance: alert and no distress Neck: no carotid bruit and no JVD Lungs: clear to auscultation bilaterally Heart: regular rate and rhythm Abdomen:  soft, non-tender; bowel sounds normal; no masses,  no organomegaly Extremities:  extremities normal, atraumatic, no cyanosis or edema Pulses: 2+ and symmetric Skin: Skin color, texture, turgor normal. No rashes or lesions Neurologic: Grossly normal Psych: Pleasant, mildly anxious  EKG: Normal sinus rhythm at 82 -personally reviewed  ASSESSMENT: Chest pressure/dyspnea on exertion - low risk stress test and echocardiogram with normal LV function (08/2015) Top normal ascending aorta 3.7 cm Palpitations-generally well controlled Dyslipidemia Recent history of breast cancer Abnormal renal function by report Labile hypertension Anxiety  PLAN: 1.   Ms. Wilkerson continues to have some labile hypertension however this is improved somewhat particularly after adding the beta-blocker.  She has had 1 episode recently but in general seems to be fairly well controlled with low normal to normal blood pressure.  We will continue her current therapies.  Plan follow-up with me annually in [place] which would be more convenient.  Pixie Casino, MD, [facility], [place] Director of the Advanced Lipid Disorders &  Cardiovascular Risk Reduction Clinic Diplomate of the American Board of Clinical Lipidology Attending Cardiologist  Direct Dial: [phone]   Fax: [phone]  Website:  [email or web address] Sherley Leser 07/24/2021, 11:09 AM

## 2021-08-07 ENCOUNTER — Telehealth: Payer: Self-pay | Admitting: Internal Medicine

## 2021-08-07 NOTE — Telephone Encounter (Signed)
Patient called with med change recommendations. She will discuss with PCP about checking cholesterol -- she thinks she is due for a visit soon.

## 2021-08-07 NOTE — Telephone Encounter (Signed)
FW: Safety and Health Notification Received: 4 days ago Pixie Casino, MD  Fidel Levy, RN Would reduce simvastatin to 20 mg daily d/t risk of myopathy with amlodipine.   Thanks.

## 2021-08-12 ENCOUNTER — Other Ambulatory Visit: Payer: Self-pay

## 2021-08-12 MED ORDER — CARVEDILOL 3.125 MG PO TABS: 3.1250 mg | ORAL_TABLET | Freq: Two times a day (BID) | ORAL | 3 refills | Status: DC

## 2021-08-18 DIAGNOSIS — Z961 Presence of intraocular lens: Secondary | ICD-10-CM | POA: Diagnosis not present

## 2021-09-11 DIAGNOSIS — N1831 Chronic kidney disease, stage 3a: Secondary | ICD-10-CM | POA: Diagnosis not present

## 2021-09-11 DIAGNOSIS — I129 Hypertensive chronic kidney disease with stage 1 through stage 4 chronic kidney disease, or unspecified chronic kidney disease: Secondary | ICD-10-CM | POA: Diagnosis not present

## 2021-09-11 DIAGNOSIS — Z6828 Body mass index (BMI) 28.0-28.9, adult: Secondary | ICD-10-CM | POA: Diagnosis not present

## 2021-09-14 DIAGNOSIS — Z20822 Contact with and (suspected) exposure to covid-19: Secondary | ICD-10-CM | POA: Diagnosis not present

## 2021-09-16 DIAGNOSIS — Z6829 Body mass index (BMI) 29.0-29.9, adult: Secondary | ICD-10-CM | POA: Diagnosis not present

## 2021-09-16 DIAGNOSIS — N1831 Chronic kidney disease, stage 3a: Secondary | ICD-10-CM | POA: Diagnosis not present

## 2021-09-16 DIAGNOSIS — T452X1A Poisoning by vitamins, accidental (unintentional), initial encounter: Secondary | ICD-10-CM | POA: Diagnosis not present

## 2021-09-16 DIAGNOSIS — I129 Hypertensive chronic kidney disease with stage 1 through stage 4 chronic kidney disease, or unspecified chronic kidney disease: Secondary | ICD-10-CM | POA: Diagnosis not present

## 2021-09-30 ENCOUNTER — Telehealth: Payer: Self-pay | Admitting: Adult Health

## 2021-09-30 NOTE — Telephone Encounter (Signed)
Rescheduled appointment per provider template. Left message. ?

## 2021-10-07 ENCOUNTER — Other Ambulatory Visit: Payer: Self-pay | Admitting: Gastroenterology

## 2021-10-07 DIAGNOSIS — K209 Esophagitis, unspecified without bleeding: Secondary | ICD-10-CM

## 2021-10-07 DIAGNOSIS — K2970 Gastritis, unspecified, without bleeding: Secondary | ICD-10-CM

## 2021-10-26 ENCOUNTER — Other Ambulatory Visit (HOSPITAL_COMMUNITY): Payer: Self-pay | Admitting: Internal Medicine

## 2021-10-26 DIAGNOSIS — Z1231 Encounter for screening mammogram for malignant neoplasm of breast: Secondary | ICD-10-CM

## 2021-11-02 ENCOUNTER — Other Ambulatory Visit: Payer: Self-pay | Admitting: Internal Medicine

## 2021-11-16 ENCOUNTER — Other Ambulatory Visit: Payer: Self-pay | Admitting: Gastroenterology

## 2021-11-16 ENCOUNTER — Other Ambulatory Visit: Payer: Self-pay | Admitting: Internal Medicine

## 2021-11-16 DIAGNOSIS — K209 Esophagitis, unspecified without bleeding: Secondary | ICD-10-CM

## 2021-11-16 DIAGNOSIS — K2970 Gastritis, unspecified, without bleeding: Secondary | ICD-10-CM

## 2021-11-23 ENCOUNTER — Ambulatory Visit (HOSPITAL_COMMUNITY)
Admission: RE | Admit: 2021-11-23 | Discharge: 2021-11-23 | Disposition: A | Discharge status: Home / Self Care | Payer: Medicare PPO | Source: Ambulatory Visit | Attending: Internal Medicine | Admitting: Internal Medicine

## 2021-11-23 DIAGNOSIS — Z1231 Encounter for screening mammogram for malignant neoplasm of breast: Secondary | ICD-10-CM | POA: Diagnosis present

## 2021-11-25 ENCOUNTER — Other Ambulatory Visit (HOSPITAL_COMMUNITY): Payer: Self-pay | Admitting: Internal Medicine

## 2021-11-25 DIAGNOSIS — R928 Other abnormal and inconclusive findings on diagnostic imaging of breast: Secondary | ICD-10-CM

## 2021-12-02 ENCOUNTER — Ambulatory Visit (HOSPITAL_COMMUNITY)
Admission: RE | Admit: 2021-12-02 | Discharge: 2021-12-02 | Disposition: A | Discharge status: Home / Self Care | Payer: Medicare PPO | Source: Ambulatory Visit | Attending: Internal Medicine | Admitting: Internal Medicine

## 2021-12-02 DIAGNOSIS — R928 Other abnormal and inconclusive findings on diagnostic imaging of breast: Secondary | ICD-10-CM | POA: Insufficient documentation

## 2021-12-15 ENCOUNTER — Inpatient Hospital Stay (HOSPITAL_COMMUNITY)
Admission: EM | Admit: 2021-12-15 | Discharge: 2021-12-17 | DRG: 281 | Disposition: A | Discharge status: Home / Self Care | Payer: Medicare PPO | Attending: Internal Medicine | Admitting: Internal Medicine

## 2021-12-15 ENCOUNTER — Emergency Department (HOSPITAL_COMMUNITY): Payer: Medicare PPO

## 2021-12-15 ENCOUNTER — Other Ambulatory Visit: Payer: Self-pay

## 2021-12-15 ENCOUNTER — Encounter (HOSPITAL_COMMUNITY): Payer: Self-pay

## 2021-12-15 DIAGNOSIS — Z823 Family history of stroke: Secondary | ICD-10-CM

## 2021-12-15 DIAGNOSIS — G8929 Other chronic pain: Secondary | ICD-10-CM | POA: Diagnosis present

## 2021-12-15 DIAGNOSIS — I214 Non-ST elevation (NSTEMI) myocardial infarction: Principal | ICD-10-CM | POA: Diagnosis present

## 2021-12-15 DIAGNOSIS — I712 Thoracic aortic aneurysm, without rupture, unspecified: Secondary | ICD-10-CM | POA: Diagnosis not present

## 2021-12-15 DIAGNOSIS — N1832 Chronic kidney disease, stage 3b: Secondary | ICD-10-CM | POA: Diagnosis present

## 2021-12-15 DIAGNOSIS — Z8249 Family history of ischemic heart disease and other diseases of the circulatory system: Secondary | ICD-10-CM | POA: Diagnosis not present

## 2021-12-15 DIAGNOSIS — K219 Gastro-esophageal reflux disease without esophagitis: Secondary | ICD-10-CM | POA: Diagnosis present

## 2021-12-15 DIAGNOSIS — Z880 Allergy status to penicillin: Secondary | ICD-10-CM | POA: Diagnosis not present

## 2021-12-15 DIAGNOSIS — Z9841 Cataract extraction status, right eye: Secondary | ICD-10-CM

## 2021-12-15 DIAGNOSIS — F419 Anxiety disorder, unspecified: Secondary | ICD-10-CM | POA: Diagnosis not present

## 2021-12-15 DIAGNOSIS — Z853 Personal history of malignant neoplasm of breast: Secondary | ICD-10-CM | POA: Diagnosis not present

## 2021-12-15 DIAGNOSIS — Z9842 Cataract extraction status, left eye: Secondary | ICD-10-CM | POA: Diagnosis not present

## 2021-12-15 DIAGNOSIS — E785 Hyperlipidemia, unspecified: Secondary | ICD-10-CM | POA: Diagnosis present

## 2021-12-15 DIAGNOSIS — Z79890 Hormone replacement therapy (postmenopausal): Secondary | ICD-10-CM

## 2021-12-15 DIAGNOSIS — R778 Other specified abnormalities of plasma proteins: Secondary | ICD-10-CM

## 2021-12-15 DIAGNOSIS — R0902 Hypoxemia: Secondary | ICD-10-CM | POA: Diagnosis present

## 2021-12-15 DIAGNOSIS — E039 Hypothyroidism, unspecified: Secondary | ICD-10-CM | POA: Diagnosis present

## 2021-12-15 DIAGNOSIS — R079 Chest pain, unspecified: Secondary | ICD-10-CM | POA: Diagnosis present

## 2021-12-15 DIAGNOSIS — Z8711 Personal history of peptic ulcer disease: Secondary | ICD-10-CM

## 2021-12-15 DIAGNOSIS — Z86010 Personal history of colonic polyps: Secondary | ICD-10-CM

## 2021-12-15 DIAGNOSIS — I129 Hypertensive chronic kidney disease with stage 1 through stage 4 chronic kidney disease, or unspecified chronic kidney disease: Secondary | ICD-10-CM | POA: Diagnosis present

## 2021-12-15 DIAGNOSIS — Z79899 Other long term (current) drug therapy: Secondary | ICD-10-CM

## 2021-12-15 DIAGNOSIS — M549 Dorsalgia, unspecified: Secondary | ICD-10-CM | POA: Diagnosis present

## 2021-12-15 DIAGNOSIS — I10 Essential (primary) hypertension: Secondary | ICD-10-CM | POA: Diagnosis not present

## 2021-12-15 DIAGNOSIS — Z90710 Acquired absence of both cervix and uterus: Secondary | ICD-10-CM | POA: Diagnosis not present

## 2021-12-15 DIAGNOSIS — Z888 Allergy status to other drugs, medicaments and biological substances status: Secondary | ICD-10-CM

## 2021-12-15 DIAGNOSIS — R2681 Unsteadiness on feet: Secondary | ICD-10-CM | POA: Diagnosis present

## 2021-12-15 DIAGNOSIS — I472 Ventricular tachycardia, unspecified: Secondary | ICD-10-CM | POA: Diagnosis not present

## 2021-12-15 DIAGNOSIS — I471 Supraventricular tachycardia: Secondary | ICD-10-CM | POA: Diagnosis not present

## 2021-12-15 DIAGNOSIS — Z885 Allergy status to narcotic agent status: Secondary | ICD-10-CM | POA: Diagnosis not present

## 2021-12-15 DIAGNOSIS — Z961 Presence of intraocular lens: Secondary | ICD-10-CM | POA: Diagnosis present

## 2021-12-15 DIAGNOSIS — Z923 Personal history of irradiation: Secondary | ICD-10-CM

## 2021-12-15 DIAGNOSIS — Z800 Family history of malignant neoplasm of digestive organs: Secondary | ICD-10-CM

## 2021-12-15 DIAGNOSIS — Z833 Family history of diabetes mellitus: Secondary | ICD-10-CM

## 2021-12-15 DIAGNOSIS — D0511 Intraductal carcinoma in situ of right breast: Secondary | ICD-10-CM | POA: Diagnosis present

## 2021-12-15 LAB — CBC
HCT: 40.8 % (ref 36.0–46.0)
Hemoglobin: 12.9 g/dL (ref 12.0–15.0)
MCH: 28.7 pg (ref 26.0–34.0)
MCHC: 31.6 g/dL (ref 30.0–36.0)
MCV: 90.9 fL (ref 80.0–100.0)
Platelets: 299 10*3/uL (ref 150–400)
RBC: 4.49 MIL/uL (ref 3.87–5.11)
RDW: 13.8 % (ref 11.5–15.5)
WBC: 6.5 10*3/uL (ref 4.0–10.5)
nRBC: 0 % (ref 0.0–0.2)

## 2021-12-15 LAB — BASIC METABOLIC PANEL
Anion gap: 6 (ref 5–15)
BUN: 12 mg/dL (ref 8–23)
CO2: 25 mmol/L (ref 22–32)
Calcium: 8.3 mg/dL — ABNORMAL LOW (ref 8.9–10.3)
Chloride: 108 mmol/L (ref 98–111)
Creatinine, Ser: 1.26 mg/dL — ABNORMAL HIGH (ref 0.44–1.00)
GFR, Estimated: 43 mL/min — ABNORMAL LOW (ref >60)
Glucose, Bld: 148 mg/dL — ABNORMAL HIGH (ref 70–99)
Potassium: 3.4 mmol/L — ABNORMAL LOW (ref 3.5–5.1)
Sodium: 139 mmol/L (ref 135–145)

## 2021-12-15 LAB — TROPONIN I (HIGH SENSITIVITY)
Troponin I (High Sensitivity): 29 ng/L — ABNORMAL HIGH (ref <18)
Troponin I (High Sensitivity): 308 ng/L (ref <18)
Troponin I (High Sensitivity): 394 ng/L (ref <18)

## 2021-12-15 LAB — MAGNESIUM: Magnesium: 2.1 mg/dL (ref 1.7–2.4)

## 2021-12-15 MED ORDER — IOHEXOL 300 MG/ML  SOLN
75.0000 mL | Freq: Once | INTRAMUSCULAR | Status: DC | PRN
Start: 2021-12-15 — End: 2021-12-15

## 2021-12-15 MED ORDER — HEPARIN (PORCINE) 25000 UT/250ML-% IV SOLN
950.0000 [IU]/h | INTRAVENOUS | Status: DC
  Administered 2021-12-15: 800 [IU]/h via INTRAVENOUS
  Administered 2021-12-16: 950 [IU]/h via INTRAVENOUS
  Filled 2021-12-15 (×2): qty 250

## 2021-12-15 MED ORDER — HEPARIN BOLUS VIA INFUSION
4000.0000 [IU] | Freq: Once | INTRAVENOUS | Status: AC
Start: 2021-12-15 — End: 2021-12-15
  Administered 2021-12-15: 4000 [IU] via INTRAVENOUS

## 2021-12-15 MED ORDER — NITROGLYCERIN 0.4 MG SL SUBL: 0.4000 mg | SUBLINGUAL_TABLET | SUBLINGUAL | Status: DC | PRN

## 2021-12-15 MED ORDER — CARVEDILOL 3.125 MG PO TABS
3.1250 mg | ORAL_TABLET | Freq: Two times a day (BID) | ORAL | Status: DC
  Administered 2021-12-15 – 2021-12-17 (×4): 3.125 mg via ORAL
  Filled 2021-12-15 (×4): qty 1

## 2021-12-15 MED ORDER — ASPIRIN 325 MG PO TABS
325.0000 mg | ORAL_TABLET | Freq: Every day | ORAL | Status: DC
  Administered 2021-12-15 – 2021-12-16 (×2): 325 mg via ORAL
  Filled 2021-12-15 (×2): qty 1

## 2021-12-15 MED ORDER — LEVOTHYROXINE SODIUM 50 MCG PO TABS
50.0000 ug | ORAL_TABLET | Freq: Every day | ORAL | Status: DC
  Administered 2021-12-16 – 2021-12-17 (×3): 50 ug via ORAL
  Filled 2021-12-15 (×2): qty 1

## 2021-12-15 MED ORDER — IOHEXOL 350 MG/ML SOLN
80.0000 mL | Freq: Once | INTRAVENOUS | Status: AC | PRN
  Administered 2021-12-15: 80 mL via INTRAVENOUS

## 2021-12-15 MED ORDER — PANTOPRAZOLE SODIUM 40 MG PO TBEC
40.0000 mg | DELAYED_RELEASE_TABLET | Freq: Every day | ORAL | Status: DC
  Administered 2021-12-15 – 2021-12-17 (×3): 40 mg via ORAL
  Filled 2021-12-15 (×3): qty 1

## 2021-12-15 MED ORDER — ACETAMINOPHEN 650 MG RE SUPP: 650.0000 mg | Freq: Four times a day (QID) | RECTAL | Status: DC | PRN

## 2021-12-15 MED ORDER — ACETAMINOPHEN 325 MG PO TABS: 650.0000 mg | ORAL_TABLET | Freq: Four times a day (QID) | ORAL | Status: DC | PRN

## 2021-12-15 MED ORDER — ATORVASTATIN CALCIUM 80 MG PO TABS
80.0000 mg | ORAL_TABLET | Freq: Every day | ORAL | Status: DC
  Administered 2021-12-15 – 2021-12-17 (×3): 80 mg via ORAL
  Filled 2021-12-15: qty 1
  Filled 2021-12-15: qty 2
  Filled 2021-12-15: qty 1

## 2021-12-15 MED ORDER — POTASSIUM CHLORIDE CRYS ER 20 MEQ PO TBCR
40.0000 meq | EXTENDED_RELEASE_TABLET | Freq: Once | ORAL | Status: AC
  Administered 2021-12-15: 40 meq via ORAL
  Filled 2021-12-15: qty 2

## 2021-12-15 MED ORDER — AMLODIPINE BESYLATE 5 MG PO TABS
5.0000 mg | ORAL_TABLET | Freq: Every day | ORAL | Status: DC
  Administered 2021-12-16 – 2021-12-17 (×2): 5 mg via ORAL
  Filled 2021-12-15 (×2): qty 1

## 2021-12-15 MED ORDER — SODIUM CHLORIDE 0.9 % IV SOLN
INTRAVENOUS | Status: AC
Start: 2021-12-15 — End: 2021-12-16

## 2021-12-15 MED ORDER — CITALOPRAM HYDROBROMIDE 20 MG PO TABS
20.0000 mg | ORAL_TABLET | Freq: Every day | ORAL | Status: DC
  Administered 2021-12-15 – 2021-12-17 (×3): 20 mg via ORAL
  Filled 2021-12-15 (×3): qty 1

## 2021-12-15 MED ORDER — ASPIRIN 81 MG PO CHEW
81.0000 mg | CHEWABLE_TABLET | Freq: Every day | ORAL | Status: DC
Start: 2021-12-16 — End: 2021-12-15

## 2021-12-15 MED ORDER — LORAZEPAM 1 MG PO TABS
1.0000 mg | ORAL_TABLET | Freq: Every evening | ORAL | Status: DC | PRN
  Administered 2021-12-16 – 2021-12-17 (×2): 1 mg via ORAL
  Filled 2021-12-15 (×2): qty 1

## 2021-12-15 MED ORDER — SODIUM CHLORIDE 0.9 % IV SOLN: INTRAVENOUS | Status: DC

## 2021-12-15 MED ORDER — POTASSIUM CHLORIDE CRYS ER 20 MEQ PO TBCR
40.0000 meq | EXTENDED_RELEASE_TABLET | Freq: Once | ORAL | Status: AC
Start: 2021-12-15 — End: 2021-12-15
  Administered 2021-12-15: 40 meq via ORAL
  Filled 2021-12-15: qty 2

## 2021-12-15 MED ORDER — POLYETHYLENE GLYCOL 3350 17 G PO PACK: 17.0000 g | PACK | Freq: Every day | ORAL | Status: DC | PRN

## 2021-12-15 NOTE — H&P (Signed)
History and Physical    Jane WHITSELL XVQ:[id number] DOB: 18-Jan-1941 DOA: 12/15/2021  PCP: Redmond School, MD   Patient coming from: Home  I have personally briefly reviewed patient's old medical records in [place]  Chief Complaint: Chest pain  HPI: Jane Wilkerson is a 81 y.o. female with medical history significant for breast cancer, hypothyroidism, hypertension, thoracic aneurysm. Patient presented to the ED with complaints of chest pain.  She reports this morning she felt unwell, and feels like she would pass out, reports that time she did not have chest pain.  Her husband could not get a blood pressure reading device read- error, and heart rate of 140.  Hence EMS was called. En route patient reports severe chest pain, mid chest, radiating up towards her neck, she describes chest pain as fire in her chest, and was associated with difficulty breathing. EMS had to pull the ambulance to the side.  After interventions she felt much better but not back to normal.  She reports prior chest pains, but unable to tell me the duration.  Last chest pain was 2 days ago, she reports 2 episodes that the mid chest.  She is unable to tell if she has had this chest pains possibly with activity or at rest.  EMS reports patient had an episode of SVT, she also had short runs of nonsustained ventricular tachycardia.  The patient was hypoxic requiring 15 L, mention of O2 sats per report from EMS but no documentation other than nonsustained episodes.   ED Course: Heart rate 97-111.  Blood pressure systolic 09-326.  O2 sats greater than 93% on room air.  Troponin 29 > 308.  Potassium 3.4.  Chest x-ray clear.  CTA chest- no PE. Cardiology was consulted, recommend admission to [facility] for cardiac cath, start heparin drip, aspirin, Coreg, Lipitor.  Review of Systems: As per HPI all other systems reviewed and negative.  Past Medical History:  Diagnosis Date   Allergy    Anxiety 07/07/2011   takes  Ativan every 4hrs   Arthritis    back    Asthma    uses inhaler prn   Breast cancer ([place])    right - local excision, chemotheraphy   Cataract    right eye   Chronic back pain    Depression    has appt to discuss depression 07/26/2011   Diverticulosis    Dizziness    occasionally   Gastric ulcer    GERD (gastroesophageal reflux disease)    takes Nexium daily   Hemorrhoids    History of nuclear stress test 11/2010   lexiscan; no evidence of inducible ischemia; normal pattern of perfusion    History of shingles 2011   Hx of colonic polyps    Hyperlipidemia    takes Zocor daily   Hypertension    takes Bystolic and Losartan daily   Hypothyroidism    takes Synthroid daily   Nausea alone    onset last Wednesday  08/04/11   Nocturia    Osteoporosis    PONV (postoperative nausea and vomiting)    PVC's (premature ventricular contractions)    Shortness of breath    with exertion   Skin rash    Urinary frequency    Urticaria of unknown origin     Past Surgical History:  Procedure Laterality Date   ABDOMINAL HYSTERECTOMY  2001   BREAST LUMPECTOMY Right 07/02/11   right breast and SNL   BREAST SURGERY Left 1972  left lumpectomy   BREAST SURGERY Right 08/11/11   margins on rt breast   CATARACT EXTRACTION W/PHACO Right 08/31/2016   Procedure: CATARACT EXTRACTION PHACO AND INTRAOCULAR LENS PLACEMENT ([place]);  Surgeon: Rutherford Guys, MD;  Location: [facility];  Service: Ophthalmology;  Laterality: Right;  CDE: 8.46   CATARACT EXTRACTION W/PHACO Left 09/14/2016   Procedure: CATARACT EXTRACTION PHACO AND INTRAOCULAR LENS PLACEMENT ([facility]);  Surgeon: Rutherford Guys, MD;  Location: [facility];  Service: Ophthalmology;  Laterality: Left;  CDE: 9.32   COLONOSCOPY     colonscopy     HEMORRHOID SURGERY     TRANSTHORACIC ECHOCARDIOGRAM  2009   normal LV & RV systolic function; mild mitral annular calcif & mild MR; trace TR     reports that she has never smoked. She has never used smokeless tobacco. She  reports that she does not drink alcohol and does not use drugs.  Allergies  Allergen Reactions   Bisoprolol-Hydrochlorothiazide Other (See Comments)    Reaction unknown Reaction unknown   Hydrocodone Other (See Comments)    Reaction unknown Reaction unknown Reaction unknown   Oxycodone Itching   Oxycodone Hcl Itching   Ziac [Bisoprolol-Hydrochlorothiazide] Other (See Comments)    Reaction unknown   Penicillins Rash    Has patient had a PCN reaction causing immediate rash, facial/tongue/throat swelling, SOB or lightheadedness with hypotension: unknown Has patient had a PCN reaction causing severe rash involving mucus membranes or skin necrosis: unknown Has patient had a PCN reaction that required hospitalization No Has patient had a PCN reaction occurring within the last 10 years: No If all of the above answers are "NO", then may proceed with Cephalosporin use.     Family History  Problem Relation Age of Onset   Colon cancer Mother        also stroke   Pancreatic cancer Father        also heart disease   Diabetes Sister    Hypertension Sister    Anesthesia problems Neg Hx    Hypotension Neg Hx    Malignant hyperthermia Neg Hx    Pseudochol deficiency Neg Hx    Stomach cancer Neg Hx    Rectal cancer Neg Hx     Prior to Admission medications   Medication Sig Start Date End Date Taking? Authorizing Provider  albuterol (VENTOLIN HFA) 108 (90 Base) MCG/ACT inhaler Inhale 1-2 puffs into the lungs every 4 (four) hours as needed for wheezing or shortness of breath. 04/17/21  Yes Melynda Ripple, MD  amLODipine (NORVASC) 5 MG tablet TAKE 1 TABLET(5 MG) BY MOUTH DAILY Patient taking differently: Take 5 mg by mouth daily. 11/02/21  Yes Hilty, Nadean Corwin, MD  carvedilol (COREG) 3.125 MG tablet Take 1 tablet (3.125 mg total) by mouth 2 (two) times daily. 08/12/21 12/15/21 Yes Hilty, Nadean Corwin, MD  citalopram (CELEXA) 20 MG tablet Take 20 mg by mouth daily.   Yes [provider]   hydrocortisone cream 0.5 % Apply 1 application topically 2 (two) times daily as needed for itching. For hemorrhoid pain   Yes [provider]  levothyroxine (SYNTHROID, LEVOTHROID) 50 MCG tablet Take 50 mcg by mouth daily.   Yes [provider]  LORazepam (ATIVAN) 1 MG tablet Take 1 mg by mouth at bedtime. For anxiety may take extra tablet if needed   Yes [provider]  losartan (COZAAR) 100 MG tablet Take 100 mg by mouth daily.   Yes [provider]  omeprazole (PRILOSEC) 40 MG capsule TAKE 1  CAPSULE BY MOUTH SHORTLY BEFORE BREAKFAST DAILY. Patient taking differently: Take 40 mg by mouth daily. TAKE 1 CAPSULE BY MOUTH SHORTLY BEFORE BREAKFAST DAILY. 11/16/21  Yes Milus Banister, MD  psyllium (METAMUCIL) 58.6 % powder Take 1 packet by mouth daily as needed (constipation).   Yes [provider]  simvastatin (ZOCOR) 40 MG tablet Take 20 mg by mouth at bedtime.     Yes [provider]  Spacer/Aero-Holding Chambers (AEROCHAMBER PLUS) inhaler Use with inhaler 04/17/21  Yes Melynda Ripple, MD  triamcinolone (KENALOG) 0.1 % Apply 1 application topically daily. 08/19/20  Yes [provider]    Physical Exam: Vitals:   12/15/21 1400 12/15/21 1430 12/15/21 1500 12/15/21 1530  BP: (!) 118/100 128/77 104/81 115/76  Pulse: 100 93 (!) 101 100  Resp: 18 (!) '25 18 14  '$ Temp:      TempSrc:      SpO2: 96% 94% 97% 96%  Weight:      Height:        Constitutional: NAD, calm, comfortable Vitals:   12/15/21 1400 12/15/21 1430 12/15/21 1500 12/15/21 1530  BP: (!) 118/100 128/77 104/81 115/76  Pulse: 100 93 (!) 101 100  Resp: 18 (!) '25 18 14  '$ Temp:      TempSrc:      SpO2: 96% 94% 97% 96%  Weight:      Height:       Eyes: PERRL, lids and conjunctivae normal ENMT: Mucous membranes are moist. Posterior pharynx clear of any exudate or lesions.  Neck: normal, supple, no masses, no thyromegaly Respiratory: clear to auscultation  bilaterally, no wheezing, no crackles. Normal respiratory effort. No accessory muscle use.  Cardiovascular: Regular rate and rhythm, no murmurs / rubs / gallops. No extremity edema.  Lower extremities warm Abdomen: no tenderness, no masses palpated. No hepatosplenomegaly. Bowel sounds positive.  Musculoskeletal: no clubbing / cyanosis. No joint deformity upper and lower extremities.  Skin: no rashes, lesions, ulcers. No induration Neurologic: No apparent cranial abnormality moving all extremities spontaneously.  Psychiatric: Normal judgment and insight. Alert and oriented x 3. Normal mood.   Labs on Admission: I have personally reviewed following labs and imaging studies  CBC: Recent Labs  Lab 12/15/21 1211  WBC 6.5  HGB 12.9  HCT 40.8  MCV 90.9  PLT 115   Basic Metabolic Panel: Recent Labs  Lab 12/15/21 1211  NA 139  K 3.4*  CL 108  CO2 25  GLUCOSE 148*  BUN 12  CREATININE 1.26*  CALCIUM 8.3*   Radiological Exams on Admission: DG Chest Port 1 View  Result Date: 12/15/2021 CLINICAL DATA:  Chest pain and shortness of breath. EXAM: PORTABLE CHEST 1 VIEW COMPARISON:  04/17/2021 FINDINGS: The cardiac silhouette, mediastinal and hilar contours are within normal limits and stable. Stable eventration of the right hemidiaphragm. No acute pulmonary findings. No pleural effusions or pulmonary lesions. The bony thorax is intact. IMPRESSION: No acute cardiopulmonary findings. Electronically Signed   By: Marijo Sanes M.D.   On: 12/15/2021 12:38    EKG: Independently reviewed.  Sinus tachycardia rate 111.  QTc 450.  No significant changes from prior.  Assessment/Plan Principal Problem:   NSTEMI (non-ST elevated myocardial infarction) ([place]) Active Problems:   Intraductal carcinoma in situ of right breast   Anxiety   Hypothyroidism   Essential hypertension   Assessment and Plan: * NSTEMI (non-ST elevated myocardial infarction) ([place]) Presenting with chestpain radiating to  neck-year-old with dyspnea.  Increasing troponin 29 > 380.  EKG  without significant changes.  Per EMS patient had SVT, labs episodes of nonsustained V. Tach.  CTA chest negative for PE. - Cardiology consulted-heparin drip, aspirin, statins, carvedilol, echo, admit to [facility] for cardiac cath-plan for tomorrow -Resume home carvedilol 3.125 mg twice daily -Trend troponin -EKG in a.m  Essential hypertension - Resume Norvasc, - hold losartan for now with contrast exposure  Hypothyroidism Resume Synthroid  Intraductal carcinoma in situ of right breast Diagnosed in 2012.  Completed 5 years of antiestrogen therapy.  Follows with heme-onc Dr. Adora Fridge- [facility],. Mendel Ryder.   DVT prophylaxis: Heparin Code Status: Full Family Communication: Spouse and daughter at bedside Disposition Plan: ~ 2 days Consults called: Cardiology Admission status: Inpt stepdown I certify that at the point of admission it is my clinical judgment that the patient will require inpatient hospital care spanning beyond 2 midnights from the point of admission due to high intensity of service, high risk for further deterioration and high frequency of surveillance required.    Author: Bethena Roys, MD 12/15/2021 8:04 PM  For on call review [email or web address].

## 2021-12-15 NOTE — ED Provider Notes (Signed)
[facility] EMERGENCY DEPARTMENT Provider Note   CSN: [id number] Arrival date & time: 12/15/21  1152     History  Chief Complaint  Patient presents with   Chest Pain    Jane Wilkerson is a 81 y.o. female.  Patient presents after episode of chest pressure radiating to her neck shortness of breath that started 10:00 this morning.  Patient had episode of heart rate in the low 90s that is since resolved.  Per EMS transfer patient had episode of ventricular arrhythmia and then SVT that converted on its own to sinus rhythm.       Home Medications Prior to Admission medications   Medication Sig Start Date End Date Taking? Authorizing Provider  albuterol (VENTOLIN HFA) 108 (90 Base) MCG/ACT inhaler Inhale 1-2 puffs into the lungs every 4 (four) hours as needed for wheezing or shortness of breath. 04/17/21  Yes Melynda Ripple, MD  amLODipine (NORVASC) 5 MG tablet TAKE 1 TABLET(5 MG) BY MOUTH DAILY Patient taking differently: Take 5 mg by mouth daily. 11/02/21  Yes Hilty, Nadean Corwin, MD  carvedilol (COREG) 3.125 MG tablet Take 1 tablet (3.125 mg total) by mouth 2 (two) times daily. 08/12/21 12/15/21 Yes Hilty, Nadean Corwin, MD  citalopram (CELEXA) 20 MG tablet Take 20 mg by mouth daily.   Yes [provider]  hydrocortisone cream 0.5 % Apply 1 application topically 2 (two) times daily as needed for itching. For hemorrhoid pain   Yes [provider]  levothyroxine (SYNTHROID, LEVOTHROID) 50 MCG tablet Take 50 mcg by mouth daily.   Yes [provider]  LORazepam (ATIVAN) 1 MG tablet Take 1 mg by mouth at bedtime. For anxiety may take extra tablet if needed   Yes [provider]  losartan (COZAAR) 100 MG tablet Take 100 mg by mouth daily.   Yes [provider]  omeprazole (PRILOSEC) 40 MG capsule TAKE 1 CAPSULE BY MOUTH SHORTLY BEFORE BREAKFAST DAILY. Patient taking differently: Take 40 mg by mouth daily. TAKE 1 CAPSULE BY MOUTH SHORTLY BEFORE BREAKFAST  DAILY. 11/16/21  Yes Milus Banister, MD  psyllium (METAMUCIL) 58.6 % powder Take 1 packet by mouth daily as needed (constipation).   Yes [provider]  simvastatin (ZOCOR) 40 MG tablet Take 20 mg by mouth at bedtime.     Yes [provider]  Spacer/Aero-Holding Chambers (AEROCHAMBER PLUS) inhaler Use with inhaler 04/17/21  Yes Melynda Ripple, MD  triamcinolone (KENALOG) 0.1 % Apply 1 application topically daily. 08/19/20  Yes [provider]      Allergies    Bisoprolol-hydrochlorothiazide, Hydrocodone, Oxycodone, Oxycodone hcl, Ziac [bisoprolol-hydrochlorothiazide], and Penicillins    Review of Systems   Review of Systems  Constitutional:  Negative for chills and fever.  HENT:  Negative for congestion.   Eyes:  Negative for visual disturbance.  Respiratory:  Positive for shortness of breath.   Cardiovascular:  Positive for chest pain.  Gastrointestinal:  Negative for abdominal pain and vomiting.  Genitourinary:  Negative for dysuria and flank pain.  Musculoskeletal:  Negative for back pain, neck pain and neck stiffness.  Skin:  Negative for rash.  Neurological:  Negative for light-headedness and headaches.    Physical Exam Updated Vital Signs BP 124/81   Pulse 98   Temp 98.2 F (36.8 C) (Oral)   Resp 15   Ht '5\' 2"'$  (1.575 m)   Wt 73.9 kg   SpO2 96%   BMI 29.81 kg/m  Physical Exam Vitals and nursing note reviewed.  Constitutional:  General: She is not in acute distress.    Appearance: She is well-developed.  HENT:     Head: Normocephalic and atraumatic.     Mouth/Throat:     Mouth: Mucous membranes are moist.  Eyes:     General:        Right eye: No discharge.        Left eye: No discharge.     Conjunctiva/sclera: Conjunctivae normal.  Neck:     Trachea: No tracheal deviation.  Cardiovascular:     Rate and Rhythm: Regular rhythm. Tachycardia present.     Heart sounds: No murmur heard. Pulmonary:     Effort: Pulmonary effort is  normal.     Breath sounds: Normal breath sounds.  Abdominal:     General: There is no distension.     Palpations: Abdomen is soft.     Tenderness: There is no abdominal tenderness. There is no guarding.  Musculoskeletal:     Cervical back: Normal range of motion and neck supple. No rigidity.  Skin:    General: Skin is warm.     Capillary Refill: Capillary refill takes less than 2 seconds.     Findings: No rash.  Neurological:     General: No focal deficit present.     Mental Status: She is alert.     Cranial Nerves: No cranial nerve deficit.  Psychiatric:        Mood and Affect: Mood normal.     ED Results / Procedures / Treatments   Labs (all labs ordered are listed, but only abnormal results are displayed) Labs Reviewed  BASIC METABOLIC PANEL - Abnormal; Notable for the following components:      Result Value   Potassium 3.4 (*)    Glucose, Bld 148 (*)    Creatinine, Ser 1.26 (*)    Calcium 8.3 (*)    GFR, Estimated 43 (*)    All other components within normal limits  TROPONIN I (HIGH SENSITIVITY) - Abnormal; Notable for the following components:   Troponin I (High Sensitivity) 29 (*)    All other components within normal limits  TROPONIN I (HIGH SENSITIVITY) - Abnormal; Notable for the following components:   Troponin I (High Sensitivity) 308 (*)    All other components within normal limits  CBC  HEPARIN LEVEL (UNFRACTIONATED)    EKG EKG Interpretation  Date/Time:  Tuesday December 15 2021 12:07:21 EDT Ventricular Rate:  111 PR Interval:  143 QRS Duration: 80 QT Interval:  331 QTC Calculation: 450 R Axis:   -39 Text Interpretation: Sinus tachycardia Anterolateral infarct, old Confirmed by [name] [phone]) on 12/15/2021 12:21:23 PM  Radiology CT Angio Chest PE W and/or Wo Contrast  Result Date: 12/15/2021 CLINICAL DATA:  Chest pain with shortness of breath. EXAM: CT ANGIOGRAPHY CHEST WITH CONTRAST TECHNIQUE: Multidetector CT imaging of the chest was  performed using the standard protocol during bolus administration of intravenous contrast. Multiplanar CT image reconstructions and MIPs were obtained to evaluate the vascular anatomy. RADIATION DOSE REDUCTION: This exam was performed according to the departmental dose-optimization program which includes automated exposure control, adjustment of the mA and/or kV according to patient size and/or use of iterative reconstruction technique. CONTRAST:  48m OMNIPAQUE IOHEXOL 350 MG/ML SOLN COMPARISON:  CT angiogram chest 10/14/2016. FINDINGS: Cardiovascular: Satisfactory opacification of the pulmonary arteries to the segmental level. No evidence of pulmonary embolism. Normal heart size. No pericardial effusion. Mediastinum/Nodes: No enlarged mediastinal, hilar, or axillary lymph nodes. Thyroid gland, trachea, and esophagus demonstrate  no significant findings. Lungs/Pleura: Lungs are clear. No pleural effusion or pneumothorax. Upper Abdomen: Peripheral splenic calcifications are unchanged. There is a rounded cyst in the spleen measuring 2 cm. Small hiatal hernia is present. Musculoskeletal: Focal density in the medial right breast measuring 1.1 x 3.0 cm appears unchanged. No acute fractures are seen. Review of the MIP images confirms the above findings. IMPRESSION: 1. No evidence for pulmonary embolism. 2. No acute cardiopulmonary process. Electronically Signed   By: Ronney Asters M.D.   On: 12/15/2021 16:26   DG Chest Port 1 View  Result Date: 12/15/2021 CLINICAL DATA:  Chest pain and shortness of breath. EXAM: PORTABLE CHEST 1 VIEW COMPARISON:  04/17/2021 FINDINGS: The cardiac silhouette, mediastinal and hilar contours are within normal limits and stable. Stable eventration of the right hemidiaphragm. No acute pulmonary findings. No pleural effusions or pulmonary lesions. The bony thorax is intact. IMPRESSION: No acute cardiopulmonary findings. Electronically Signed   By: Marijo Sanes M.D.   On: 12/15/2021 12:38     Procedures .Critical Care  Performed by: [name], MD Authorized by: [name], MD   Critical care provider statement:    Critical care time (minutes):  30   Critical care start time:  12/15/2021 1:00 PM   Critical care end time:  12/15/2021 1:30 PM   Critical care time was exclusive of:  Separately billable procedures and treating other patients and teaching time   Critical care was necessary to treat or prevent imminent or life-threatening deterioration of the following conditions:  Cardiac failure   Critical care was time spent personally by me on the following activities:  Development of treatment plan with patient or surrogate, discussions with consultants, evaluation of patient's response to treatment, examination of patient, ordering and review of laboratory studies, ordering and review of radiographic studies, pulse oximetry, re-evaluation of patient's condition and review of old charts     Medications Ordered in ED Medications  aspirin tablet 325 mg (325 mg Oral Given 12/15/21 1616)  atorvastatin (LIPITOR) tablet 80 mg (80 mg Oral Given 12/15/21 1616)  carvedilol (COREG) tablet 3.125 mg (3.125 mg Oral Given 12/15/21 1616)  0.9 %  sodium chloride infusion ( Intravenous New Bag/Given 12/15/21 1616)  heparin ADULT infusion 100 units/mL (25000 units/288m) (800 Units/hr Intravenous New Bag/Given 12/15/21 1615)  iohexol (OMNIPAQUE) 300 MG/ML solution 75 mL (has no administration in time range)  potassium chloride SA (KLOR-CON M) CR tablet 40 mEq (40 mEq Oral Given 12/15/21 1415)  heparin bolus via infusion 4,000 Units (4,000 Units Intravenous Bolus from Bag 12/15/21 1615)  iohexol (OMNIPAQUE) 350 MG/ML injection 80 mL (80 mLs Intravenous Contrast Given 12/15/21 1601)    ED Course/ Medical Decision Making/ A&P                           Medical Decision Making Amount and/or Complexity of Data Reviewed Labs: ordered. Radiology: ordered.  Risk Prescription drug  management. Decision regarding hospitalization.   Patient presents with concerning chest pain that fortunately has resolved.  Given risk factors ACS work-up and PE work-up initiated.  Patient had mild troponin elevation reviewed 29, second troponin later reviewed 308.  Discussed with cardiology after initial troponin elevation and they were able to evaluate the patient in the ER and discussed plan for catheter/further work-up in the hospital.  CT scan performed to look for signs of blood clot, reviewed results independently and negative. Other blood work unremarkable electrolytes, kidney function within normal  range.  Concern clinically for NSTEMI.  EKG no acute ST elevation reviewed.  Discussed with hospitalist for admission.  Heparin ordered and admission pending.        Final Clinical Impression(s) / ED Diagnoses Final diagnoses:  Acute chest pain  Troponin level elevated  NSTEMI (non-ST elevated myocardial infarction) [facility])    Rx / DC Orders ED Discharge Orders     None         [name], MD 12/15/21 1730

## 2021-12-15 NOTE — Progress Notes (Signed)
ANTICOAGULATION CONSULT NOTE - Initial Consult  Pharmacy Consult for Heparin Indication: chest pain/ACS  Allergies: bisoprolol-hctz, hydrocodone, oxycodone, PCN  Patient Measurements: Height: '5\' 2"'$  (157.5 cm) Weight: 73.9 kg (163 lb) IBW/kg (Calculated) : 50.1 Heparin Dosing Weight: 66 kg  Labs: Recent Labs    12/15/21 1211 12/15/21 1415  HGB 12.9  --   HCT 40.8  --   PLT 299  --   CREATININE 1.26*  --   TROPONINIHS 29* 308*    Estimated Creatinine Clearance: 33.5 mL/min (A) (by C-G formula based on SCr of 1.26 mg/dL (H)).   Assessment: 81 year old female hx HTN and CKD admitted with CP. Pharmacy consulted to assist with systemic heparin anticoagulation for STEMI/ACS. Pt is not on any anticoagulants at home  Goal of Therapy:  Heparin level 0.3-0.7 units/ml Monitor platelets by anticoagulation protocol: Yes   Plan:  Give 4000 units bolus x 1 Start heparin infusion at 800 units/hr Check anti-Xa level in 8 hours and daily while on heparin Continue to monitor H&H and platelets  Lorenso Courier, PharmD Clinical Pharmacist 12/15/2021 3:56 PM

## 2021-12-15 NOTE — ED Notes (Signed)
Warm blanket provided, family at bedside. Denies any pain at this time. Call bell in reach.

## 2021-12-15 NOTE — Assessment & Plan Note (Signed)
-   Resume Norvasc, - hold losartan for now with contrast exposure

## 2021-12-15 NOTE — Assessment & Plan Note (Signed)
Diagnosed in 2012.  Completed 5 years of antiestrogen therapy.  Follows with heme-onc Dr. Adora Fridge- [facility],. Mendel Ryder.

## 2021-12-15 NOTE — ED Triage Notes (Signed)
Patient via EMS due to chest pain and shortness of breath that started around 10am. Patient had heart rate in the 190s upon fire department arrival. During transport patient had ventricular arrhythmias and then SVT and converted to sinus tach.

## 2021-12-15 NOTE — ED Notes (Signed)
EDP at bedside  

## 2021-12-15 NOTE — ED Notes (Signed)
Critical Lab: Troponin 394 called from lab. Dr. Denton Brick aware.

## 2021-12-15 NOTE — Assessment & Plan Note (Addendum)
Presenting with chestpain radiating to neck-year-old with dyspnea.  Increasing troponin 29 > 380.  EKG without significant changes.  Per EMS patient had SVT, labs episodes of nonsustained V. Tach.  CTA chest negative for PE. - Cardiology consulted-heparin drip, aspirin, statins, carvedilol, echo, admit to [facility] for cardiac cath-plan for tomorrow -Resume home carvedilol 3.125 mg twice daily -Trend troponin -EKG in a.m

## 2021-12-15 NOTE — Assessment & Plan Note (Signed)
Resume Synthroid. 

## 2021-12-15 NOTE — ED Notes (Signed)
Date and time results received: 12/15/21 1530 (use smartphrase ".now" to insert current time)  Test: troponin Critical Value: 308  Name of Provider Notified: Trifon  Orders Received? Or Actions Taken?:  notified.

## 2021-12-15 NOTE — Consult Note (Signed)
Cardiology Consultation:  Patient ID: Jane Wilkerson MRN: [id number]; DOB: [birth year]  Admit date: 12/15/2021 Date of Consult: 12/15/2021  Primary Care Provider: Redmond School, MD Primary Cardiologist: Pixie Casino, MD  Primary Electrophysiologist:  None   Patient Profile:  Jane Wilkerson is a 81 y.o. female with a hx of CKD, hypertension who is being seen today for the evaluation of chest pain at the request of Jane Morrison, MD.  History of Present Illness:  [name] presents with acute chest pain.  She reports she awoke this morning around 10 AM and felt poorly.  She reports standing up and just feeling very uneasy and unsteady on her feet.  She reports an intense burning sensation in her chest and into her neck.  EMS was called and she was found to be in SVT.  She had short runs of nonsustained ventricular tachycardia.  There is mention of torsade per report from EMS but we have no documentation other than nonsustained episodes.  They also report that she was hypoxic requiring 15 L nasal cannula.  She was then brought to the emergency room for further evaluation.  On arrival to the emergency room her chest pain has resolved.  She is satting 97% on room air.  She is noticeably tachycardic with heart rate in the low 100s.  She reports no further chest pain or pressure.  Laboratory data notable for CKD stage IIIb which is at her baseline.  Creatinine 1.26.  Potassium 3.4.  Hemoglobin 12.9.  EKG demonstrates sinus rhythm with poor R wave progression.  This is unchanged from prior.  Chest x-ray clear.  She is maintaining sinus rhythm.  No recurrence of SVT.  Update: Repeat troponin 308.  No chest pain or chest discomfort.  Discussed need for left heart catheterization.  The patient is willing to proceed.  Heart Pathway Score:       Past Medical History: Past Medical History:  Diagnosis Date   Allergy    Anxiety 07/07/2011   takes Ativan every 4hrs   Arthritis    back    Asthma    uses  inhaler prn   Breast cancer ([place])    right - local excision, chemotheraphy   Cataract    right eye   Chronic back pain    Depression    has appt to discuss depression 07/26/2011   Diverticulosis    Dizziness    occasionally   Gastric ulcer    GERD (gastroesophageal reflux disease)    takes Nexium daily   Hemorrhoids    History of nuclear stress test 11/2010   lexiscan; no evidence of inducible ischemia; normal pattern of perfusion    History of shingles 2011   Hx of colonic polyps    Hyperlipidemia    takes Zocor daily   Hypertension    takes Bystolic and Losartan daily   Hypothyroidism    takes Synthroid daily   Nausea alone    onset last Wednesday  08/04/11   Nocturia    Osteoporosis    PONV (postoperative nausea and vomiting)    PVC's (premature ventricular contractions)    Shortness of breath    with exertion   Skin rash    Urinary frequency    Urticaria of unknown origin     Past Surgical History: Past Surgical History:  Procedure Laterality Date   ABDOMINAL HYSTERECTOMY  2001   BREAST LUMPECTOMY Right 07/02/11   right breast and SNL   BREAST SURGERY Left 1972  left lumpectomy   BREAST SURGERY Right 08/11/11   margins on rt breast   CATARACT EXTRACTION W/PHACO Right 08/31/2016   Procedure: CATARACT EXTRACTION PHACO AND INTRAOCULAR LENS PLACEMENT ([place]);  Surgeon: Rutherford Guys, MD;  Location: [facility];  Service: Ophthalmology;  Laterality: Right;  CDE: 8.46   CATARACT EXTRACTION W/PHACO Left 09/14/2016   Procedure: CATARACT EXTRACTION PHACO AND INTRAOCULAR LENS PLACEMENT ([facility]);  Surgeon: Rutherford Guys, MD;  Location: [facility];  Service: Ophthalmology;  Laterality: Left;  CDE: 9.32   COLONOSCOPY     colonscopy     HEMORRHOID SURGERY     TRANSTHORACIC ECHOCARDIOGRAM  2009   normal LV & RV systolic function; mild mitral annular calcif & mild MR; trace TR     Home Medications:  Prior to Admission medications   Medication Sig Start Date End Date Taking? Authorizing  Provider  albuterol (VENTOLIN HFA) 108 (90 Base) MCG/ACT inhaler Inhale 1-2 puffs into the lungs every 4 (four) hours as needed for wheezing or shortness of breath. 04/17/21  Yes Melynda Ripple, MD  amLODipine (NORVASC) 5 MG tablet TAKE 1 TABLET(5 MG) BY MOUTH DAILY Patient taking differently: Take 5 mg by mouth daily. 11/02/21  Yes Hilty, Nadean Corwin, MD  carvedilol (COREG) 3.125 MG tablet Take 1 tablet (3.125 mg total) by mouth 2 (two) times daily. 08/12/21 12/15/21 Yes Hilty, Nadean Corwin, MD  citalopram (CELEXA) 20 MG tablet Take 20 mg by mouth daily.   Yes [provider]  hydrocortisone cream 0.5 % Apply 1 application topically 2 (two) times daily as needed for itching. For hemorrhoid pain   Yes [provider]  levothyroxine (SYNTHROID, LEVOTHROID) 50 MCG tablet Take 50 mcg by mouth daily.   Yes [provider]  LORazepam (ATIVAN) 1 MG tablet Take 1 mg by mouth at bedtime. For anxiety may take extra tablet if needed   Yes [provider]  losartan (COZAAR) 100 MG tablet Take 100 mg by mouth daily.   Yes [provider]  omeprazole (PRILOSEC) 40 MG capsule TAKE 1 CAPSULE BY MOUTH SHORTLY BEFORE BREAKFAST DAILY. Patient taking differently: Take 40 mg by mouth daily. TAKE 1 CAPSULE BY MOUTH SHORTLY BEFORE BREAKFAST DAILY. 11/16/21  Yes Milus Banister, MD  psyllium (METAMUCIL) 58.6 % powder Take 1 packet by mouth daily as needed (constipation).   Yes [provider]  simvastatin (ZOCOR) 40 MG tablet Take 20 mg by mouth at bedtime.     Yes [provider]  Spacer/Aero-Holding Chambers (AEROCHAMBER PLUS) inhaler Use with inhaler 04/17/21  Yes Melynda Ripple, MD  triamcinolone (KENALOG) 0.1 % Apply 1 application topically daily. 08/19/20  Yes [provider]    Inpatient Medications: Scheduled Meds:  Continuous Infusions:  sodium chloride     PRN Meds:   Allergies:    Allergies  Allergen Reactions    Bisoprolol-Hydrochlorothiazide Other (See Comments)    Reaction unknown Reaction unknown   Hydrocodone Other (See Comments)    Reaction unknown Reaction unknown Reaction unknown   Oxycodone Itching   Oxycodone Hcl Itching   Ziac [Bisoprolol-Hydrochlorothiazide] Other (See Comments)    Reaction unknown   Penicillins Rash    Has patient had a PCN reaction causing immediate rash, facial/tongue/throat swelling, SOB or lightheadedness with hypotension: unknown Has patient had a PCN reaction causing severe rash involving mucus membranes or skin necrosis: unknown Has patient had a PCN reaction that required hospitalization No Has patient had a PCN reaction occurring within the last 10 years: No If all  of the above answers are "NO", then may proceed with Cephalosporin use.     Social History:   Social History   Socioeconomic History   Marital status: Married    Spouse name: Not on file   Number of children: Not on file   Years of education: 10    Highest education level: Not on file  Occupational History   Occupation: retired    Fish farm manager: RETIRED  Tobacco Use   Smoking status: Never   Smokeless tobacco: Never  Substance and Sexual Activity   Alcohol use: No   Drug use: No   Sexual activity: Yes    Birth control/protection: Surgical  Other Topics Concern   Not on file  Social History Narrative   Married with three children   [name]- age 11, Lattie Haw age 93 and [place] age 64- all reside in [place]   Social Determinants of Health   Financial Resource Strain: Not on file  Food Insecurity: Not on file  Transportation Needs: Not on file  Physical Activity: Not on file  Stress: Not on file  Social Connections: Not on file  Intimate Partner Violence: Not on file     Family History:    Family History  Problem Relation Age of Onset   Colon cancer Mother        also stroke   Pancreatic cancer Father        also heart disease   Diabetes Sister    Hypertension Sister     Anesthesia problems Neg Hx    Hypotension Neg Hx    Malignant hyperthermia Neg Hx    Pseudochol deficiency Neg Hx    Stomach cancer Neg Hx    Rectal cancer Neg Hx      ROS:  All other ROS reviewed and negative. Pertinent positives noted in the HPI.     Physical Exam/Data:   Vitals:   12/15/21 1230 12/15/21 1300 12/15/21 1330 12/15/21 1400  BP: 97/69 101/72 106/76 (!) 118/100  Pulse: (!) 106 100 99 100  Resp: '16 16 16 18  '$ Temp:      TempSrc:      SpO2: 97% 93% 94% 96%  Weight:      Height:       No intake or output data in the 24 hours ending 12/15/21 1507     12/15/2021   12:09 PM 07/24/2021   11:01 AM 12/15/2020   11:56 AM  Last 3 Weights  Weight (lbs) 163 lb 161 lb 3.2 oz 159 lb 6.4 oz  Weight (kg) 73.936 kg 73.12 kg 72.303 kg    Body mass index is 29.81 kg/m.  General: Well nourished, well developed, in no acute distress Head: Atraumatic, normal size  Eyes: PEERLA, EOMI  Neck: Supple, no JVD Endocrine: No thryomegaly Cardiac: Normal S1, S2; RRR; no murmurs, rubs, or gallops Lungs: Clear to auscultation bilaterally, no wheezing, rhonchi or rales  Abd: Soft, nontender, no hepatomegaly  Ext: No edema, pulses 2+ Musculoskeletal: No deformities, BUE and BLE strength normal and equal Skin: Warm and dry, no rashes   Neuro: Alert and oriented to person, place, time, and situation, CNII-XII grossly intact, no focal deficits  Psych: Normal mood and affect   EKG:  The EKG was personally reviewed and demonstrates: Sinus tachycardia heart rate 110, poor R wave progression, unchanged from prior Telemetry:  Telemetry was personally reviewed and demonstrates: Sinus tachycardia low 100s  Relevant CV Studies: Echo 2017: EF 50-55% Nuclear medicine stress test 2017, normal  Laboratory  Data: High Sensitivity Troponin:   Recent Labs  Lab 12/15/21 1211  TROPONINIHS 29*     Cardiac EnzymesNo results for input(s): "TROPONINI" in the last 168 hours. No results for input(s):  "TROPIPOC" in the last 168 hours.  Chemistry Recent Labs  Lab 12/15/21 1211  NA 139  K 3.4*  CL 108  CO2 25  GLUCOSE 148*  BUN 12  CREATININE 1.26*  CALCIUM 8.3*  GFRNONAA 43*  ANIONGAP 6    No results for input(s): "PROT", "ALBUMIN", "AST", "ALT", "ALKPHOS", "BILITOT" in the last 168 hours. Hematology Recent Labs  Lab 12/15/21 1211  WBC 6.5  RBC 4.49  HGB 12.9  HCT 40.8  MCV 90.9  MCH 28.7  MCHC 31.6  RDW 13.8  PLT 299   BNPNo results for input(s): "BNP", "PROBNP" in the last 168 hours.  DDimer No results for input(s): "DDIMER" in the last 168 hours.  Radiology/Studies:  DG Chest Port 1 View  Result Date: 12/15/2021 CLINICAL DATA:  Chest pain and shortness of breath. EXAM: PORTABLE CHEST 1 VIEW COMPARISON:  04/17/2021 FINDINGS: The cardiac silhouette, mediastinal and hilar contours are within normal limits and stable. Stable eventration of the right hemidiaphragm. No acute pulmonary findings. No pleural effusions or pulmonary lesions. The bony thorax is intact. IMPRESSION: No acute cardiopulmonary findings. Electronically Signed   By: Marijo Sanes M.D.   On: 12/15/2021 12:38    Assessment and Plan:   #Chest Pain/NSTEMI #SVT #Non-Sustained VT -She presents after feeling poorly.  She had an episode of SVT that was documented with emergency medical services.  There is also short runs of nonsustained ventricular tachycardia.  She has since gone back into sinus rhythm and has had no recurrent arrhythmias.  She did describe intense burning in her chest when this occurred.  Symptoms of chest discomfort and arrhythmia have resolved. -Laboratory data will notable for low potassium as well as minimally elevated high-sensitivity troponin at 29.  Repeat troponin up to 300.  She has ruled in for non-STEMI. -give 325 mg aspirin. Start 81 mg daily. Start heparin drip. Give lipitor 80 mg daily. Start home beta blocker, coreg 3.125 mg BID.  -echo pending.  -Discussed left heart  catheterization.  Given symptoms and elevated troponin I think this is beneficial.  She is willing to proceed.  She understands she has kidney disease but given symptoms left heart catheterization is warranted.  She is willing to proceed tomorrow.  Risk and benefits explained. -She will be admitted to the hospitalist service.  We we will ask them to transfer [facility] to [facility].  We will tentatively plan for left heart catheterization tomorrow.  She will remain on telemetry.  Cardiology to follow again tomorrow. -If she has recurrent chest symptoms will give nitroglycerin.  She is currently chest pain-free.  Shared Decision Making/Informed Consent The risks [stroke (1 in 1000), death (1 in 1000), kidney failure [usually temporary] (1 in 500), bleeding (1 in 200), allergic reaction [possibly serious] (1 in 200)], benefits (diagnostic support and management of coronary artery disease) and alternatives of a cardiac catheterization were discussed in detail with Ms. Im and she is willing to proceed.   For questions or updates, please contact [place] Please consult [email or web address] for contact info under   Signed, [place] T. Audie Box, MD, [place]  12/15/2021 3:07 PM

## 2021-12-16 ENCOUNTER — Inpatient Hospital Stay (HOSPITAL_COMMUNITY): Payer: Medicare PPO

## 2021-12-16 DIAGNOSIS — I471 Supraventricular tachycardia: Secondary | ICD-10-CM

## 2021-12-16 DIAGNOSIS — R079 Chest pain, unspecified: Secondary | ICD-10-CM

## 2021-12-16 DIAGNOSIS — I10 Essential (primary) hypertension: Secondary | ICD-10-CM | POA: Diagnosis not present

## 2021-12-16 DIAGNOSIS — I214 Non-ST elevation (NSTEMI) myocardial infarction: Secondary | ICD-10-CM | POA: Diagnosis not present

## 2021-12-16 LAB — BASIC METABOLIC PANEL
Anion gap: 6 (ref 5–15)
BUN: 11 mg/dL (ref 8–23)
CO2: 26 mmol/L (ref 22–32)
Calcium: 8.6 mg/dL — ABNORMAL LOW (ref 8.9–10.3)
Chloride: 107 mmol/L (ref 98–111)
Creatinine, Ser: 1.05 mg/dL — ABNORMAL HIGH (ref 0.44–1.00)
GFR, Estimated: 54 mL/min — ABNORMAL LOW (ref >60)
Glucose, Bld: 95 mg/dL (ref 70–99)
Potassium: 4.4 mmol/L (ref 3.5–5.1)
Sodium: 139 mmol/L (ref 135–145)

## 2021-12-16 LAB — SURGICAL PCR SCREEN
MRSA, PCR: NEGATIVE
Staphylococcus aureus: NEGATIVE

## 2021-12-16 LAB — ECHOCARDIOGRAM COMPLETE
Area-P 1/2: 7.9 cm2
Height: 62 in
S' Lateral: 2.8 cm
Weight: 2582.03 oz

## 2021-12-16 LAB — CBC
HCT: 36.4 % (ref 36.0–46.0)
Hemoglobin: 11.7 g/dL — ABNORMAL LOW (ref 12.0–15.0)
MCH: 29 pg (ref 26.0–34.0)
MCHC: 32.1 g/dL (ref 30.0–36.0)
MCV: 90.3 fL (ref 80.0–100.0)
Platelets: 274 10*3/uL (ref 150–400)
RBC: 4.03 MIL/uL (ref 3.87–5.11)
RDW: 14 % (ref 11.5–15.5)
WBC: 10.2 10*3/uL (ref 4.0–10.5)
nRBC: 0 % (ref 0.0–0.2)

## 2021-12-16 LAB — LIPID PANEL
Cholesterol: 151 mg/dL (ref 0–200)
HDL: 50 mg/dL (ref >40)
LDL Cholesterol: 61 mg/dL (ref 0–99)
Total CHOL/HDL Ratio: 3 RATIO
Triglycerides: 202 mg/dL — ABNORMAL HIGH (ref <150)
VLDL: 40 mg/dL (ref 0–40)

## 2021-12-16 LAB — HEPARIN LEVEL (UNFRACTIONATED)
Heparin Unfractionated: 0.24 IU/mL — ABNORMAL LOW (ref 0.30–0.70)
Heparin Unfractionated: 0.37 IU/mL (ref 0.30–0.70)
Heparin Unfractionated: 0.38 IU/mL (ref 0.30–0.70)

## 2021-12-16 MED ORDER — ASPIRIN 81 MG PO CHEW
81.0000 mg | CHEWABLE_TABLET | ORAL | Status: AC
  Administered 2021-12-16: 81 mg via ORAL
  Filled 2021-12-16: qty 1

## 2021-12-16 MED ORDER — SODIUM CHLORIDE 0.9 % IV SOLN: 250.0000 mL | INTRAVENOUS | Status: DC | PRN

## 2021-12-16 MED ORDER — SODIUM CHLORIDE 0.9% FLUSH
3.0000 mL | Freq: Two times a day (BID) | INTRAVENOUS | Status: DC
  Administered 2021-12-16 – 2021-12-17 (×2): 3 mL via INTRAVENOUS

## 2021-12-16 MED ORDER — SODIUM CHLORIDE 0.9 % IV SOLN: INTRAVENOUS | Status: DC

## 2021-12-16 MED ORDER — PERFLUTREN LIPID MICROSPHERE
1.0000 mL | INTRAVENOUS | Status: AC | PRN
  Administered 2021-12-16: 2 mL via INTRAVENOUS

## 2021-12-16 MED ORDER — SODIUM CHLORIDE 0.9% FLUSH: 3.0000 mL | INTRAVENOUS | Status: DC | PRN

## 2021-12-16 NOTE — Progress Notes (Signed)
PROGRESS NOTE    Jane Wilkerson  GYJ:[id number] DOB: 1941-02-18 DOA: 12/15/2021 PCP: Redmond School, MD   Brief Narrative:  Jane Wilkerson is a 81 y.o. female with medical history significant for breast cancer, hypothyroidism, hypertension, thoracic aneurysm. Patient presented to the ED with complaints of chest pain. EMS reports patient had an episode of SVT, she also had short runs of nonsustained ventricular tachycardia.  The patient was initially hypoxic requiring 15 L, mention of O2 sats per report from EMS but no documentation other than nonsustained episodes.   Assessment & Plan:   Principal Problem:   NSTEMI (non-ST elevated myocardial infarction) ([place]) Active Problems:   Intraductal carcinoma in situ of right breast   Anxiety   Hypothyroidism   Essential hypertension   NSTEMI (non-ST elevated myocardial infarction) [facility]) - Cardiology following - Troponin elevation concerning; EKG without overt changes - Cardiology plan for cath later today - CTA chest negative for PE. - Continue - heparin drip, aspirin, statins, carvedilol,  - Echo pending -Resume home carvedilol 3.125 mg twice daily   Essential hypertension - Resume Norvasc, - hold losartan for now with contrast exposure to avoid renal complications   Hypothyroidism Resume Synthroid   Intraductal carcinoma in situ of right breast Diagnosed in 2012.  Completed 5 years of antiestrogen therapy.  Follows with heme-onc Dr. Adora Fridge- Cause,. Mendel Ryder.     DVT prophylaxis: Heparin Code Status: Full Family Communication: Spouse and daughter at bedside  Status is: Inpt  Dispo: The patient is from: home              Anticipated d/c is to: home              Anticipated d/c date is: 24-48h              Patient currently NOT medically stable for discharge  Consultants:  Cardiology  Procedures:  Cardiac cath  Antimicrobials:  None   Subjective: No acute issues/events overnight - chest pain resolved, anxious  about pending procedure  Objective: Vitals:   12/16/21 0000 12/16/21 0200 12/16/21 0225 12/16/21 0321  BP: 140/83 123/81  135/80  Pulse: 88 87 91 90  Resp: (!) '21 17 18 20  '$ Temp:   98.2 F (36.8 C) 99.3 F (37.4 C)  TempSrc:   Oral Axillary  SpO2: 90% 90% 90% 96%  Weight:    73.2 kg  Height:    '5\' 2"'$  (1.575 m)    Intake/Output Summary (Last 24 hours) at 12/16/2021 0740 Last data filed at 12/16/2021 0420 Gross per 24 hour  Intake 744.77 ml  Output --  Net 744.77 ml   Filed Weights   12/15/21 1209 12/16/21 0321  Weight: 73.9 kg 73.2 kg    Examination:  General exam: Appears calm and comfortable  Respiratory system: Clear to auscultation. Respiratory effort normal. Cardiovascular system: S1 & S2 heard, RRR. No JVD, murmurs, rubs, gallops or clicks. No pedal edema. Gastrointestinal system: Abdomen is nondistended, soft and nontender. No organomegaly or masses felt. Normal bowel sounds heard. Central nervous system: Alert and oriented. No focal neurological deficits. Extremities: Symmetric 5 x 5 power. Skin: No rashes, lesions or ulcers Psychiatry: Judgement and insight appear normal. Mood & affect appropriate.     Data Reviewed: I have personally reviewed following labs and imaging studies  CBC: Recent Labs  Lab 12/15/21 1211 12/16/21 0020  WBC 6.5 10.2  HGB 12.9 11.7*  HCT 40.8 36.4  MCV 90.9 90.3  PLT 299 274   Basic  Metabolic Panel: Recent Labs  Lab 12/15/21 1211 12/15/21 1951 12/16/21 0020  NA 139  --  139  K 3.4*  --  4.4  CL 108  --  107  CO2 25  --  26  GLUCOSE 148*  --  95  BUN 12  --  11  CREATININE 1.26*  --  1.05*  CALCIUM 8.3*  --  8.6*  MG  --  2.1  --    GFR: Estimated Creatinine Clearance: 40 mL/min (A) (by C-G formula based on SCr of 1.05 mg/dL (H)). Liver Function Tests: No results for input(s): "AST", "ALT", "ALKPHOS", "BILITOT", "PROT", "ALBUMIN" in the last 168 hours. No results for input(s): "LIPASE", "AMYLASE" in the last 168  hours. No results for input(s): "AMMONIA" in the last 168 hours. Coagulation Profile: No results for input(s): "INR", "PROTIME" in the last 168 hours. Cardiac Enzymes: No results for input(s): "CKTOTAL", "CKMB", "CKMBINDEX", "TROPONINI" in the last 168 hours. BNP (last 3 results) No results for input(s): "PROBNP" in the last 8760 hours. HbA1C: No results for input(s): "HGBA1C" in the last 72 hours. CBG: No results for input(s): "GLUCAP" in the last 168 hours. Lipid Profile: No results for input(s): "CHOL", "HDL", "LDLCALC", "TRIG", "CHOLHDL", "LDLDIRECT" in the last 72 hours. Thyroid Function Tests: No results for input(s): "TSH", "T4TOTAL", "FREET4", "T3FREE", "THYROIDAB" in the last 72 hours. Anemia Panel: No results for input(s): "VITAMINB12", "FOLATE", "FERRITIN", "TIBC", "IRON", "RETICCTPCT" in the last 72 hours. Sepsis Labs: No results for input(s): "PROCALCITON", "LATICACIDVEN" in the last 168 hours.  No results found for this or any previous visit (from the past 240 hour(s)).       Radiology Studies: CT Angio Chest PE W and/or Wo Contrast  Result Date: 12/15/2021 CLINICAL DATA:  Chest pain with shortness of breath. EXAM: CT ANGIOGRAPHY CHEST WITH CONTRAST TECHNIQUE: Multidetector CT imaging of the chest was performed using the standard protocol during bolus administration of intravenous contrast. Multiplanar CT image reconstructions and MIPs were obtained to evaluate the vascular anatomy. RADIATION DOSE REDUCTION: This exam was performed according to the departmental dose-optimization program which includes automated exposure control, adjustment of the mA and/or kV according to patient size and/or use of iterative reconstruction technique. CONTRAST:  63m OMNIPAQUE IOHEXOL 350 MG/ML SOLN COMPARISON:  CT angiogram chest 10/14/2016. FINDINGS: Cardiovascular: Satisfactory opacification of the pulmonary arteries to the segmental level. No evidence of pulmonary embolism. Normal heart  size. No pericardial effusion. Mediastinum/Nodes: No enlarged mediastinal, hilar, or axillary lymph nodes. Thyroid gland, trachea, and esophagus demonstrate no significant findings. Lungs/Pleura: Lungs are clear. No pleural effusion or pneumothorax. Upper Abdomen: Peripheral splenic calcifications are unchanged. There is a rounded cyst in the spleen measuring 2 cm. Small hiatal hernia is present. Musculoskeletal: Focal density in the medial right breast measuring 1.1 x 3.0 cm appears unchanged. No acute fractures are seen. Review of the MIP images confirms the above findings. IMPRESSION: 1. No evidence for pulmonary embolism. 2. No acute cardiopulmonary process. Electronically Signed   By: ARonney AstersM.D.   On: 12/15/2021 16:26   DG Chest Port 1 View  Result Date: 12/15/2021 CLINICAL DATA:  Chest pain and shortness of breath. EXAM: PORTABLE CHEST 1 VIEW COMPARISON:  04/17/2021 FINDINGS: The cardiac silhouette, mediastinal and hilar contours are within normal limits and stable. Stable eventration of the right hemidiaphragm. No acute pulmonary findings. No pleural effusions or pulmonary lesions. The bony thorax is intact. IMPRESSION: No acute cardiopulmonary findings. Electronically Signed   By: PRicky StabsD.  On: 12/15/2021 12:38        Scheduled Meds:  amLODipine  5 mg Oral Daily   aspirin  325 mg Oral Daily   atorvastatin  80 mg Oral Daily   carvedilol  3.125 mg Oral BID WC   citalopram  20 mg Oral Daily   levothyroxine  50 mcg Oral Q0600   pantoprazole  40 mg Oral Daily   sodium chloride flush  3 mL Intravenous Q12H   Continuous Infusions:  sodium chloride     sodium chloride 100 mL/hr at 12/16/21 0420   sodium chloride 100 mL/hr at 12/15/21 2217   heparin 950 Units/hr (12/16/21 0229)     LOS: 1 day   Time spent: 54mn  Baine Decesare C Jasilyn Holderman, DO [facility]  If 7PM-7AM, please contact night-coverage [email or web address]  12/16/2021, 7:40 AM

## 2021-12-16 NOTE — Progress Notes (Signed)
ANTICOAGULATION CONSULT NOTE   Pharmacy Consult for Heparin Indication: chest pain/ACS  Allergies: bisoprolol-hctz, hydrocodone, oxycodone, PCN  Patient Measurements: Height: '5\' 2"'$  (157.5 cm) Weight: 73.9 kg (163 lb) IBW/kg (Calculated) : 50.1 Heparin Dosing Weight: 66 kg  Labs: Recent Labs    12/15/21 1211 12/15/21 1415 12/15/21 1951 12/16/21 0020  HGB 12.9  --   --  11.7*  HCT 40.8  --   --  36.4  PLT 299  --   --  274  HEPARINUNFRC  --   --   --  0.24*  CREATININE 1.26*  --   --   --   TROPONINIHS 29* 308* 394*  --      Estimated Creatinine Clearance: 33.5 mL/min (A) (by C-G formula based on SCr of 1.26 mg/dL (H)).   Assessment: 81 year old female hx HTN and CKD admitted with CP. Pharmacy consulted to assist with systemic heparin anticoagulation for STEMI/ACS. Pt is not on any anticoagulants at home  Initial heparin level below goal: 0.24 on 800 units/hr, no issues with infusion or overt bleeding reported by RN; CBC stable  Goal of Therapy:  Heparin level 0.3-0.7 units/ml Monitor platelets by anticoagulation protocol: Yes   Plan:  Increase heparin infusion to 950 units/hr Check anti-Xa level in 8 hours and daily while on heparin Continue to monitor H&H and platelets  Georga Bora, PharmD Clinical Pharmacist 12/16/2021 2:01 AM Please check AMION for all [place] numbers

## 2021-12-16 NOTE — Progress Notes (Signed)
Progress Note: Patient is currently asymptomatic chest pain-free with stable hemodynamics.  Id iscussed with patient and family.  Due to the late hour and with me on STEMI call for the community I discussed rescheduling the cardiac catheterization until first case tomorrow morning at 7:30 AM for cath possible PCI if indicated.  The patient and family are in full agreement this decision and will proceed with the plan to cancel the cath tonight and plan for tomorrow a.m.  We will feed the patient tonight.  Keep n.p.o. past midnight.  Continue patient on IV heparin.    Shelva Majestic, MD  12/16/2021  6:33 PM

## 2021-12-16 NOTE — H&P (View-Only) (Signed)
Progress Note  Patient Name: Jane Wilkerson Date of Encounter: 12/16/2021  [facility] Cardiologist: Pixie Casino, MD   Subjective   Feeling well this morning. Husband at the bedside.   Inpatient Medications    Scheduled Meds:  amLODipine  5 mg Oral Daily   aspirin  325 mg Oral Daily   atorvastatin  80 mg Oral Daily   carvedilol  3.125 mg Oral BID WC   citalopram  20 mg Oral Daily   levothyroxine  50 mcg Oral Q0600   pantoprazole  40 mg Oral Daily   sodium chloride flush  3 mL Intravenous Q12H   Continuous Infusions:  sodium chloride     sodium chloride 100 mL/hr at 12/16/21 0420   sodium chloride 100 mL/hr at 12/15/21 2217   heparin 950 Units/hr (12/16/21 0229)   PRN Meds: sodium chloride, acetaminophen **OR** acetaminophen, LORazepam, nitroGLYCERIN, perflutren lipid microspheres (DEFINITY) IV suspension, polyethylene glycol, sodium chloride flush   Vital Signs    Vitals:   12/16/21 0200 12/16/21 0225 12/16/21 0321 12/16/21 0745  BP: 123/81  135/80 (!) 141/93  Pulse: 87 91 90 90  Resp: '17 18 20 19  '$ Temp:  98.2 F (36.8 C) 99.3 F (37.4 C) 98.4 F (36.9 C)  TempSrc:  Oral Axillary Oral  SpO2: 90% 90% 96% 94%  Weight:   73.2 kg   Height:   '5\' 2"'$  (1.575 m)     Intake/Output Summary (Last 24 hours) at 12/16/2021 0910 Last data filed at 12/16/2021 0420 Gross per 24 hour  Intake 744.77 ml  Output --  Net 744.77 ml      12/16/2021    3:21 AM 12/15/2021   12:09 PM 07/24/2021   11:01 AM  Last 3 Weights  Weight (lbs) 161 lb 6 oz 163 lb 161 lb 3.2 oz  Weight (kg) 73.2 kg 73.936 kg 73.12 kg      Telemetry    Sinus Rhythm - Personally Reviewed  ECG    SR with poor R wave progression - Personally Reviewed  Physical Exam   GEN: No acute distress.   Neck: No JVD Cardiac: RRR, no murmurs, rubs, or gallops.  Respiratory: Clear to auscultation bilaterally. GI: Soft, nontender, non-distended  MS: No edema; No deformity. Neuro:  Nonfocal  Psych:  Normal affect   Labs    High Sensitivity Troponin:   Recent Labs  Lab 12/15/21 1211 12/15/21 1415 12/15/21 1951  TROPONINIHS 29* 308* 394*     Chemistry Recent Labs  Lab 12/15/21 1211 12/15/21 1951 12/16/21 0020  NA 139  --  139  K 3.4*  --  4.4  CL 108  --  107  CO2 25  --  26  GLUCOSE 148*  --  95  BUN 12  --  11  CREATININE 1.26*  --  1.05*  CALCIUM 8.3*  --  8.6*  MG  --  2.1  --   GFRNONAA 43*  --  54*  ANIONGAP 6  --  6    Lipids No results for input(s): "CHOL", "TRIG", "HDL", "LABVLDL", "LDLCALC", "CHOLHDL" in the last 168 hours.  Hematology Recent Labs  Lab 12/15/21 1211 12/16/21 0020  WBC 6.5 10.2  RBC 4.49 4.03  HGB 12.9 11.7*  HCT 40.8 36.4  MCV 90.9 90.3  MCH 28.7 29.0  MCHC 31.6 32.1  RDW 13.8 14.0  PLT 299 274   Thyroid No results for input(s): "TSH", "FREET4" in the last 168 hours.  BNPNo results for  input(s): "BNP", "PROBNP" in the last 168 hours.  DDimer No results for input(s): "DDIMER" in the last 168 hours.   Radiology    CT Angio Chest PE W and/or Wo Contrast  Result Date: 12/15/2021 CLINICAL DATA:  Chest pain with shortness of breath. EXAM: CT ANGIOGRAPHY CHEST WITH CONTRAST TECHNIQUE: Multidetector CT imaging of the chest was performed using the standard protocol during bolus administration of intravenous contrast. Multiplanar CT image reconstructions and MIPs were obtained to evaluate the vascular anatomy. RADIATION DOSE REDUCTION: This exam was performed according to the departmental dose-optimization program which includes automated exposure control, adjustment of the mA and/or kV according to patient size and/or use of iterative reconstruction technique. CONTRAST:  59m OMNIPAQUE IOHEXOL 350 MG/ML SOLN COMPARISON:  CT angiogram chest 10/14/2016. FINDINGS: Cardiovascular: Satisfactory opacification of the pulmonary arteries to the segmental level. No evidence of pulmonary embolism. Normal heart size. No pericardial effusion.  Mediastinum/Nodes: No enlarged mediastinal, hilar, or axillary lymph nodes. Thyroid gland, trachea, and esophagus demonstrate no significant findings. Lungs/Pleura: Lungs are clear. No pleural effusion or pneumothorax. Upper Abdomen: Peripheral splenic calcifications are unchanged. There is a rounded cyst in the spleen measuring 2 cm. Small hiatal hernia is present. Musculoskeletal: Focal density in the medial right breast measuring 1.1 x 3.0 cm appears unchanged. No acute fractures are seen. Review of the MIP images confirms the above findings. IMPRESSION: 1. No evidence for pulmonary embolism. 2. No acute cardiopulmonary process. Electronically Signed   By: ARonney AstersM.D.   On: 12/15/2021 16:26   DG Chest Port 1 View  Result Date: 12/15/2021 CLINICAL DATA:  Chest pain and shortness of breath. EXAM: PORTABLE CHEST 1 VIEW COMPARISON:  04/17/2021 FINDINGS: The cardiac silhouette, mediastinal and hilar contours are within normal limits and stable. Stable eventration of the right hemidiaphragm. No acute pulmonary findings. No pleural effusions or pulmonary lesions. The bony thorax is intact. IMPRESSION: No acute cardiopulmonary findings. Electronically Signed   By: PMarijo SanesM.D.   On: 12/15/2021 12:38    Cardiac Studies   Echo: pending  Patient Profile     81y.o. female with a hx of CKD, hypertension who was for the evaluation of chest pain at the request of JElnora Morrison MD.  [place]   NSTEMI: hsTn 308>>394, remains on IV heparin gtt. Planned for cardiac cath today. Echo pending. No recurrent chest pain.  -- continue ASA, statin, coreg  NSVT/SVT: had episode with EMS prior to arrival to [facility]  with rates in the 200s and short runs of NSVT while in the ED. No recurrent episodes overnight.  -- continue coreg -- echo pending  HTN: stable -- continue norvasc '5mg'$  daily, coreg 3.'125mg'$  BID  CKD stage IIIb: stable Cr 1.2>>1.05  Hypothyroidism: on synthroid   For questions or  updates, please contact CIthacaPlease consult [email or web address] for contact info under        Signed, LReino Bellis NP  12/16/2021, 9:10 AM    Agree with note by LReino BellisNP-C  Patient mated with non-STEMI with episodes of PSVT with rates of 200 and NSVT.  Troponins were elevated up to 400.  She is currently in sinus rhythm.  She had no further tachyarrhythmias.  Her exam is benign.  Plan left heart cath today.  JLorretta Harp M.D., FClifton Hill FResearch Psychiatric Center FLaverta BaltimoreFClaymont[address] [place]  [id number] [phone]/14/2023 10:42 AM

## 2021-12-16 NOTE — Progress Notes (Signed)
ANTICOAGULATION CONSULT NOTE   Pharmacy Consult for Heparin Indication: chest pain/ACS  Allergies: bisoprolol-hctz, hydrocodone, oxycodone, PCN  Patient Measurements: Height: '5\' 2"'$  (157.5 cm) Weight: 73.2 kg (161 lb 6 oz) IBW/kg (Calculated) : 50.1 Heparin Dosing Weight: 66 kg  Labs: Recent Labs    12/15/21 1211 12/15/21 1415 12/15/21 1951 12/16/21 0020 12/16/21 1908  HGB 12.9  --   --  11.7*  --   HCT 40.8  --   --  36.4  --   PLT 299  --   --  274  --   HEPARINUNFRC  --   --   --  0.24* 0.38  CREATININE 1.26*  --   --  1.05*  --   TROPONINIHS 29* 308* 394*  --   --      Estimated Creatinine Clearance: 40 mL/min (A) (by C-G formula based on SCr of 1.05 mg/dL (H)).   Assessment: 81 year old female hx HTN and CKD admitted with CP. Pharmacy consulted to assist with systemic heparin anticoagulation for STEMI/ACS. Pt is not on any anticoagulants at home  Cath re-schedule for tomorrow. Heparin came back therapeutic tonight.   Goal of Therapy:  Heparin level 0.3-0.7 units/ml Monitor platelets by anticoagulation protocol: Yes   Plan:  Cont heparin infusion 950 units/hr Check anti-Xa level daily while on heparin Continue to monitor H&H and platelets  Onnie Boer, PharmD, BCIDP, AAHIVP, CPP Infectious Disease Pharmacist 12/16/2021 7:45 PM

## 2021-12-16 NOTE — Care Management (Signed)
  Transition of Care [facility]) Screening Note   Patient Details  Name: Jane Wilkerson Date of Birth: 28-Mar-1941   Transition of Care [facility]) CM/SW Contact:    Bethena Roys, RN Phone Number: 12/16/2021, 10:16 AM    Transition of Care Department [facility]) has reviewed the patient and no TOC needs have been identified at this time. We will continue to monitor patient advancement through interdisciplinary progression rounds. If new patient transition needs arise, please place a TOC consult.

## 2021-12-16 NOTE — Progress Notes (Addendum)
Progress Note  Patient Name: Jane Wilkerson Date of Encounter: 12/16/2021  [facility] Cardiologist: Pixie Casino, MD   Subjective   Feeling well this morning. Husband at the bedside.   Inpatient Medications    Scheduled Meds:  amLODipine  5 mg Oral Daily   aspirin  325 mg Oral Daily   atorvastatin  80 mg Oral Daily   carvedilol  3.125 mg Oral BID WC   citalopram  20 mg Oral Daily   levothyroxine  50 mcg Oral Q0600   pantoprazole  40 mg Oral Daily   sodium chloride flush  3 mL Intravenous Q12H   Continuous Infusions:  sodium chloride     sodium chloride 100 mL/hr at 12/16/21 0420   sodium chloride 100 mL/hr at 12/15/21 2217   heparin 950 Units/hr (12/16/21 0229)   PRN Meds: sodium chloride, acetaminophen **OR** acetaminophen, LORazepam, nitroGLYCERIN, perflutren lipid microspheres (DEFINITY) IV suspension, polyethylene glycol, sodium chloride flush   Vital Signs    Vitals:   12/16/21 0200 12/16/21 0225 12/16/21 0321 12/16/21 0745  BP: 123/81  135/80 (!) 141/93  Pulse: 87 91 90 90  Resp: '17 18 20 19  '$ Temp:  98.2 F (36.8 C) 99.3 F (37.4 C) 98.4 F (36.9 C)  TempSrc:  Oral Axillary Oral  SpO2: 90% 90% 96% 94%  Weight:   73.2 kg   Height:   '5\' 2"'$  (1.575 m)     Intake/Output Summary (Last 24 hours) at 12/16/2021 0910 Last data filed at 12/16/2021 0420 Gross per 24 hour  Intake 744.77 ml  Output --  Net 744.77 ml      12/16/2021    3:21 AM 12/15/2021   12:09 PM 07/24/2021   11:01 AM  Last 3 Weights  Weight (lbs) 161 lb 6 oz 163 lb 161 lb 3.2 oz  Weight (kg) 73.2 kg 73.936 kg 73.12 kg      Telemetry    Sinus Rhythm - Personally Reviewed  ECG    SR with poor R wave progression - Personally Reviewed  Physical Exam   GEN: No acute distress.   Neck: No JVD Cardiac: RRR, no murmurs, rubs, or gallops.  Respiratory: Clear to auscultation bilaterally. GI: Soft, nontender, non-distended  MS: No edema; No deformity. Neuro:  Nonfocal  Psych:  Normal affect   Labs    High Sensitivity Troponin:   Recent Labs  Lab 12/15/21 1211 12/15/21 1415 12/15/21 1951  TROPONINIHS 29* 308* 394*     Chemistry Recent Labs  Lab 12/15/21 1211 12/15/21 1951 12/16/21 0020  NA 139  --  139  K 3.4*  --  4.4  CL 108  --  107  CO2 25  --  26  GLUCOSE 148*  --  95  BUN 12  --  11  CREATININE 1.26*  --  1.05*  CALCIUM 8.3*  --  8.6*  MG  --  2.1  --   GFRNONAA 43*  --  54*  ANIONGAP 6  --  6    Lipids No results for input(s): "CHOL", "TRIG", "HDL", "LABVLDL", "LDLCALC", "CHOLHDL" in the last 168 hours.  Hematology Recent Labs  Lab 12/15/21 1211 12/16/21 0020  WBC 6.5 10.2  RBC 4.49 4.03  HGB 12.9 11.7*  HCT 40.8 36.4  MCV 90.9 90.3  MCH 28.7 29.0  MCHC 31.6 32.1  RDW 13.8 14.0  PLT 299 274   Thyroid No results for input(s): "TSH", "FREET4" in the last 168 hours.  BNPNo results for  input(s): "BNP", "PROBNP" in the last 168 hours.  DDimer No results for input(s): "DDIMER" in the last 168 hours.   Radiology    CT Angio Chest PE W and/or Wo Contrast  Result Date: 12/15/2021 CLINICAL DATA:  Chest pain with shortness of breath. EXAM: CT ANGIOGRAPHY CHEST WITH CONTRAST TECHNIQUE: Multidetector CT imaging of the chest was performed using the standard protocol during bolus administration of intravenous contrast. Multiplanar CT image reconstructions and MIPs were obtained to evaluate the vascular anatomy. RADIATION DOSE REDUCTION: This exam was performed according to the departmental dose-optimization program which includes automated exposure control, adjustment of the mA and/or kV according to patient size and/or use of iterative reconstruction technique. CONTRAST:  70m OMNIPAQUE IOHEXOL 350 MG/ML SOLN COMPARISON:  CT angiogram chest 10/14/2016. FINDINGS: Cardiovascular: Satisfactory opacification of the pulmonary arteries to the segmental level. No evidence of pulmonary embolism. Normal heart size. No pericardial effusion.  Mediastinum/Nodes: No enlarged mediastinal, hilar, or axillary lymph nodes. Thyroid gland, trachea, and esophagus demonstrate no significant findings. Lungs/Pleura: Lungs are clear. No pleural effusion or pneumothorax. Upper Abdomen: Peripheral splenic calcifications are unchanged. There is a rounded cyst in the spleen measuring 2 cm. Small hiatal hernia is present. Musculoskeletal: Focal density in the medial right breast measuring 1.1 x 3.0 cm appears unchanged. No acute fractures are seen. Review of the MIP images confirms the above findings. IMPRESSION: 1. No evidence for pulmonary embolism. 2. No acute cardiopulmonary process. Electronically Signed   By: ARonney AstersM.D.   On: 12/15/2021 16:26   DG Chest Port 1 View  Result Date: 12/15/2021 CLINICAL DATA:  Chest pain and shortness of breath. EXAM: PORTABLE CHEST 1 VIEW COMPARISON:  04/17/2021 FINDINGS: The cardiac silhouette, mediastinal and hilar contours are within normal limits and stable. Stable eventration of the right hemidiaphragm. No acute pulmonary findings. No pleural effusions or pulmonary lesions. The bony thorax is intact. IMPRESSION: No acute cardiopulmonary findings. Electronically Signed   By: PMarijo SanesM.D.   On: 12/15/2021 12:38    Cardiac Studies   Echo: pending  Patient Profile     81y.o. female with a hx of CKD, hypertension who was for the evaluation of chest pain at the request of JElnora Morrison MD.  ANew [place]   NSTEMI: hsTn 308>>394, remains on IV heparin gtt. Planned for cardiac cath today. Echo pending. No recurrent chest pain.  -- continue ASA, statin, coreg  NSVT/SVT: had episode with EMS prior to arrival to [facility]  with rates in the 200s and short runs of NSVT while in the ED. No recurrent episodes overnight.  -- continue coreg -- echo pending  HTN: stable -- continue norvasc '5mg'$  daily, coreg 3.'125mg'$  BID  CKD stage IIIb: stable Cr 1.2>>1.05  Hypothyroidism: on synthroid   For questions or  updates, please contact CPeckPlease consult [email or web address] for contact info under        Signed, LReino Bellis NP  12/16/2021, 9:10 AM    Agree with note by LReino BellisNP-C  Patient mated with non-STEMI with episodes of PSVT with rates of 200 and NSVT.  Troponins were elevated up to 400.  She is currently in sinus rhythm.  She had no further tachyarrhythmias.  Her exam is benign.  Plan left heart cath today.  JLorretta Harp M.D., FCokeville FReno End[facility] LLP FLaverta BaltimoreFLos Ojos[address] SGiles [place]  [id number] [phone]/14/2023 10:42 AM

## 2021-12-17 ENCOUNTER — Encounter (HOSPITAL_COMMUNITY): Payer: Self-pay | Admitting: Cardiovascular Disease

## 2021-12-17 ENCOUNTER — Inpatient Hospital Stay (HOSPITAL_COMMUNITY)
Admission: EM | Disposition: A | Discharge status: Home / Self Care | Payer: Self-pay | Source: Home / Self Care | Attending: Internal Medicine

## 2021-12-17 DIAGNOSIS — I214 Non-ST elevation (NSTEMI) myocardial infarction: Secondary | ICD-10-CM | POA: Diagnosis not present

## 2021-12-17 HISTORY — PX: LEFT HEART CATH AND CORONARY ANGIOGRAPHY: CATH118249

## 2021-12-17 LAB — BASIC METABOLIC PANEL
Anion gap: 11 (ref 5–15)
BUN: 13 mg/dL (ref 8–23)
CO2: 24 mmol/L (ref 22–32)
Calcium: 8.8 mg/dL — ABNORMAL LOW (ref 8.9–10.3)
Chloride: 101 mmol/L (ref 98–111)
Creatinine, Ser: 1.22 mg/dL — ABNORMAL HIGH (ref 0.44–1.00)
GFR, Estimated: 45 mL/min — ABNORMAL LOW (ref >60)
Glucose, Bld: 114 mg/dL — ABNORMAL HIGH (ref 70–99)
Potassium: 3.6 mmol/L (ref 3.5–5.1)
Sodium: 136 mmol/L (ref 135–145)

## 2021-12-17 LAB — HEMOGLOBIN A1C
Hgb A1c MFr Bld: 6 % — ABNORMAL HIGH (ref 4.8–5.6)
Mean Plasma Glucose: 125.5 mg/dL

## 2021-12-17 LAB — CBC
HCT: 36.1 % (ref 36.0–46.0)
Hemoglobin: 11.8 g/dL — ABNORMAL LOW (ref 12.0–15.0)
MCH: 28.6 pg (ref 26.0–34.0)
MCHC: 32.7 g/dL (ref 30.0–36.0)
MCV: 87.4 fL (ref 80.0–100.0)
Platelets: 268 10*3/uL (ref 150–400)
RBC: 4.13 MIL/uL (ref 3.87–5.11)
RDW: 13.9 % (ref 11.5–15.5)
WBC: 8.4 10*3/uL (ref 4.0–10.5)
nRBC: 0 % (ref 0.0–0.2)

## 2021-12-17 LAB — HEPARIN LEVEL (UNFRACTIONATED): Heparin Unfractionated: 0.42 IU/mL (ref 0.30–0.70)

## 2021-12-17 SURGERY — LEFT HEART CATH AND CORONARY ANGIOGRAPHY
Anesthesia: LOCAL

## 2021-12-17 MED ORDER — FENTANYL CITRATE (PF) 100 MCG/2ML IJ SOLN
INTRAMUSCULAR | Status: AC
  Filled 2021-12-17: qty 2

## 2021-12-17 MED ORDER — HEPARIN SODIUM (PORCINE) 1000 UNIT/ML IJ SOLN
INTRAMUSCULAR | Status: AC
  Filled 2021-12-17: qty 10

## 2021-12-17 MED ORDER — SODIUM CHLORIDE 0.9 % IV SOLN: 250.0000 mL | INTRAVENOUS | Status: DC | PRN

## 2021-12-17 MED ORDER — ASPIRIN 81 MG PO CHEW: 81.0000 mg | CHEWABLE_TABLET | Freq: Every day | ORAL | Status: DC

## 2021-12-17 MED ORDER — LABETALOL HCL 5 MG/ML IV SOLN: 10.0000 mg | INTRAVENOUS | Status: DC | PRN

## 2021-12-17 MED ORDER — VERAPAMIL HCL 2.5 MG/ML IV SOLN
INTRAVENOUS | Status: AC
  Filled 2021-12-17: qty 2

## 2021-12-17 MED ORDER — MIDAZOLAM HCL 2 MG/2ML IJ SOLN
INTRAMUSCULAR | Status: AC
  Filled 2021-12-17: qty 2

## 2021-12-17 MED ORDER — LIDOCAINE HCL (PF) 1 % IJ SOLN
INTRAMUSCULAR | Status: DC | PRN
  Administered 2021-12-17: 15 mL

## 2021-12-17 MED ORDER — ATORVASTATIN CALCIUM 80 MG PO TABS: 80.0000 mg | ORAL_TABLET | Freq: Every day | ORAL | 1 refills | Status: DC

## 2021-12-17 MED ORDER — ONDANSETRON HCL 4 MG/2ML IJ SOLN: 4.0000 mg | Freq: Four times a day (QID) | INTRAMUSCULAR | Status: DC | PRN

## 2021-12-17 MED ORDER — SODIUM CHLORIDE 0.9 % IV SOLN: INTRAVENOUS | Status: DC

## 2021-12-17 MED ORDER — FENTANYL CITRATE (PF) 100 MCG/2ML IJ SOLN
INTRAMUSCULAR | Status: DC | PRN
Start: 2021-12-17 — End: 2021-12-17
  Administered 2021-12-17: 25 ug via INTRAVENOUS

## 2021-12-17 MED ORDER — SODIUM CHLORIDE 0.9% FLUSH: 3.0000 mL | INTRAVENOUS | Status: DC | PRN

## 2021-12-17 MED ORDER — ASPIRIN 81 MG PO CHEW
81.0000 mg | CHEWABLE_TABLET | ORAL | Status: AC
  Administered 2021-12-17: 81 mg via ORAL
  Filled 2021-12-17: qty 1

## 2021-12-17 MED ORDER — ASPIRIN 81 MG PO CHEW: 81.0000 mg | CHEWABLE_TABLET | Freq: Every day | ORAL | 0 refills | Status: DC

## 2021-12-17 MED ORDER — IOHEXOL 350 MG/ML SOLN
INTRAVENOUS | Status: DC | PRN
  Administered 2021-12-17: 45 mL

## 2021-12-17 MED ORDER — MIDAZOLAM HCL 2 MG/2ML IJ SOLN
INTRAMUSCULAR | Status: DC | PRN
  Administered 2021-12-17: 1 mg via INTRAVENOUS

## 2021-12-17 MED ORDER — HEPARIN (PORCINE) IN NACL 1000-0.9 UT/500ML-% IV SOLN
INTRAVENOUS | Status: DC | PRN
  Administered 2021-12-17 (×2): 500 mL

## 2021-12-17 MED ORDER — HYDRALAZINE HCL 20 MG/ML IJ SOLN: 10.0000 mg | INTRAMUSCULAR | Status: DC | PRN

## 2021-12-17 MED ORDER — SODIUM CHLORIDE 0.9% FLUSH
3.0000 mL | Freq: Two times a day (BID) | INTRAVENOUS | Status: DC
  Administered 2021-12-17: 3 mL via INTRAVENOUS

## 2021-12-17 MED ORDER — NITROGLYCERIN 0.4 MG SL SUBL: 0.4000 mg | SUBLINGUAL_TABLET | SUBLINGUAL | 0 refills | Status: AC | PRN

## 2021-12-17 MED ORDER — ACETAMINOPHEN 325 MG PO TABS: 650.0000 mg | ORAL_TABLET | ORAL | Status: DC | PRN

## 2021-12-17 MED ORDER — LIDOCAINE HCL (PF) 1 % IJ SOLN
INTRAMUSCULAR | Status: AC
  Filled 2021-12-17: qty 30

## 2021-12-17 SURGICAL SUPPLY — 12 items
CATH INFINITI 5FR MULTPACK ANG (CATHETERS) ×1 IMPLANT
CLOSURE MYNX CONTROL 5F (Vascular Products) ×1 IMPLANT
GLIDESHEATH SLEND SS 6F .021 (SHEATH) IMPLANT
GUIDEWIRE INQWIRE 1.5J.035X260 (WIRE) IMPLANT
INQWIRE 1.5J .035X260CM (WIRE)
KIT HEART LEFT (KITS) ×2 IMPLANT
PACK CARDIAC CATHETERIZATION (CUSTOM PROCEDURE TRAY) ×2 IMPLANT
SHEATH PINNACLE 5F 10CM (SHEATH) ×1 IMPLANT
SHEATH PROBE COVER 6X72 (BAG) ×1 IMPLANT
TRANSDUCER W/STOPCOCK (MISCELLANEOUS) ×2 IMPLANT
TUBING CIL FLEX 10 FLL-RA (TUBING) ×2 IMPLANT
WIRE EMERALD 3MM-J .035X150CM (WIRE) ×1 IMPLANT

## 2021-12-17 NOTE — Progress Notes (Signed)
ANTICOAGULATION CONSULT NOTE   Pharmacy Consult for Heparin Indication: chest pain/ACS  Allergies: bisoprolol-hctz, hydrocodone, oxycodone, PCN  Patient Measurements: Height: '5\' 2"'$  (157.5 cm) Weight: 73.2 kg (161 lb 6 oz) IBW/kg (Calculated) : 50.1 Heparin Dosing Weight: 66 kg  Labs: Recent Labs    12/15/21 1211 12/15/21 1211 12/15/21 1415 12/15/21 1951 12/16/21 0020 12/16/21 0936 12/16/21 1908 12/17/21 0240  HGB 12.9  --   --   --  11.7*  --   --  11.8*  HCT 40.8  --   --   --  36.4  --   --  36.1  PLT 299  --   --   --  274  --   --  268  HEPARINUNFRC  --    < >  --   --  0.24* 0.37 0.38 0.42  CREATININE 1.26*  --   --   --  1.05*  --   --  1.22*  TROPONINIHS 29*  --  308* 394*  --   --   --   --    < > = values in this interval not displayed.     Estimated Creatinine Clearance: 34.4 mL/min (A) (by C-G formula based on SCr of 1.22 mg/dL (H)).   Assessment: 81 year old female hx HTN and CKD admitted with CP. Pharmacy consulted to assist with systemic heparin anticoagulation for STEMI/ACS. Pt is not on any anticoagulants at home. Plans are for cath today -heparin level at goal, CBC stable   Goal of Therapy:  Heparin level 0.3-0.7 units/ml Monitor platelets by anticoagulation protocol: Yes   Plan:  Cont heparin infusion 950 units/hr Will follow plans post cath  Hildred Laser, PharmD Clinical Pharmacist **Pharmacist phone directory can now be found on [email or web address] ([email or web address]).  Listed under [place].

## 2021-12-17 NOTE — Discharge Summary (Signed)
Physician Discharge Summary  Jane Wilkerson:[id number] DOB: 1940/09/08 DOA: 12/15/2021  PCP: Redmond School, MD  Admit date: 12/15/2021 Discharge date: 12/17/2021  Admitted From: Home Disposition: Home  Recommendations for Outpatient Follow-up:  Follow up with PCP in 1-2 weeks Follow-up with cardiology as scheduled  Discharge Condition: Stable CODE STATUS: Full Diet recommendation: Low-salt low-fat diet  Brief/Interim Summary: Jane Wilkerson is a 81 y.o. female with medical history significant for breast cancer, hypothyroidism, hypertension, thoracic aneurysm. Patient presented to the ED with complaints of chest pain. EMS reports patient had an episode of SVT, she also had short runs of nonsustained ventricular tachycardia.  The patient was initially hypoxic requiring 15 L, mention of O2 sats per report from EMS but no documentation other than nonsustained episodes.   Patient evaluated as below for NSTEMI with elevated troponin and chest pain, cardiology completed catheterization recommending medical management given no overt or intervenable aspects of cardiac cath.  Echo confirms normal systolic function without diastolic dysfunction.  Cardiac cath shows no significant obstruction 20% ostial narrowing of mid LAD.  Patient otherwise stable and agreeable for discharge home, of note patient stay was complicated by transient episode of visual changes with questionable lines or spots noted post cath but resolved spontaneously without any further deficits or episodes.  Recommend close monitor these at home, if they continue would recommend eye evaluation with ophthalmology.  Possibly consistent with migraine with aura but patient has no such history and did not have any notable migraine prior to discharge.  Continue medical regimen as below per cardiology otherwise stable and agreeable for discharge home.  Discharge Diagnoses:  Principal Problem:   NSTEMI (non-ST elevated myocardial infarction)  [facility]) Active Problems:   Intraductal carcinoma in situ of right breast   Anxiety   Hypothyroidism   Essential hypertension   Discharge Instructions  Discharge Instructions     Discharge patient   Complete by: As directed    Discharge disposition: 01-Home or Self Care   Discharge patient date: 12/17/2021      Allergies as of 12/17/2021       Reactions   Bisoprolol-hydrochlorothiazide Other (See Comments)   Reaction unknown Reaction unknown   Hydrocodone Other (See Comments)   Reaction unknown Reaction unknown Reaction unknown   Oxycodone Itching   Oxycodone Hcl Itching   Ziac [bisoprolol-hydrochlorothiazide] Other (See Comments)   Reaction unknown   Penicillins Rash   Has patient had a PCN reaction causing immediate rash, facial/tongue/throat swelling, SOB or lightheadedness with hypotension: unknown Has patient had a PCN reaction causing severe rash involving mucus membranes or skin necrosis: unknown Has patient had a PCN reaction that required hospitalization No Has patient had a PCN reaction occurring within the last 10 years: No If all of the above answers are "NO", then may proceed with Cephalosporin use.        Medication List     STOP taking these medications    losartan 100 MG tablet Commonly known as: COZAAR   simvastatin 40 MG tablet Commonly known as: ZOCOR       TAKE these medications    AeroChamber Plus inhaler Use with inhaler   albuterol 108 (90 Base) MCG/ACT inhaler Commonly known as: VENTOLIN HFA Inhale 1-2 puffs into the lungs every 4 (four) hours as needed for wheezing or shortness of breath.   amLODipine 5 MG tablet Commonly known as: NORVASC TAKE 1 TABLET(5 MG) BY MOUTH DAILY What changed: See the new instructions.   aspirin 81 MG chewable  tablet Chew 1 tablet (81 mg total) by mouth daily. Start taking on: December 18, 2021   atorvastatin 80 MG tablet Commonly known as: LIPITOR Take 1 tablet (80 mg total) by mouth  daily. Start taking on: December 18, 2021   carvedilol 3.125 MG tablet Commonly known as: COREG Take 1 tablet (3.125 mg total) by mouth 2 (two) times daily.   citalopram 20 MG tablet Commonly known as: CELEXA Take 20 mg by mouth daily.   hydrocortisone cream 0.5 % Apply 1 application topically 2 (two) times daily as needed for itching. For hemorrhoid pain   levothyroxine 50 MCG tablet Commonly known as: SYNTHROID Take 50 mcg by mouth daily.   LORazepam 1 MG tablet Commonly known as: ATIVAN Take 1 mg by mouth at bedtime. For anxiety may take extra tablet if needed   nitroGLYCERIN 0.4 MG SL tablet Commonly known as: NITROSTAT Place 1 tablet (0.4 mg total) under the tongue every 5 (five) minutes as needed for chest pain.   omeprazole 40 MG capsule Commonly known as: PRILOSEC TAKE 1 CAPSULE BY MOUTH SHORTLY BEFORE BREAKFAST DAILY. What changed: See the new instructions.   psyllium 58.6 % powder Commonly known as: METAMUCIL Take 1 packet by mouth daily as needed (constipation).   triamcinolone cream 0.1 % Commonly known as: KENALOG Apply 1 application topically daily.        Allergies  Allergen Reactions   Bisoprolol-Hydrochlorothiazide Other (See Comments)    Reaction unknown Reaction unknown   Hydrocodone Other (See Comments)    Reaction unknown Reaction unknown Reaction unknown   Oxycodone Itching   Oxycodone Hcl Itching   Ziac [Bisoprolol-Hydrochlorothiazide] Other (See Comments)    Reaction unknown   Penicillins Rash    Has patient had a PCN reaction causing immediate rash, facial/tongue/throat swelling, SOB or lightheadedness with hypotension: unknown Has patient had a PCN reaction causing severe rash involving mucus membranes or skin necrosis: unknown Has patient had a PCN reaction that required hospitalization No Has patient had a PCN reaction occurring within the last 10 years: No If all of the above answers are "NO", then may proceed with Cephalosporin  use.     Consultations: Cardiology  Procedures/Studies: CARDIAC CATHETERIZATION  Result Date: 12/17/2021   Ost LAD to Prox LAD lesion is 20% stenosed.   Non-stenotic Mid LAD to Dist LAD lesion. No significant coronary obstructive disease with smooth 20% ostial narrowing of the LAD and evidence for a mid LAD intramyocardial segment without muscle bridging. Normal large dominant left circumflex vessel with normal small nondominant RCA. LVEDP 10 mmHg. RECOMMENDATION: Medical therapy.   ECHOCARDIOGRAM COMPLETE  Result Date: 12/16/2021    ECHOCARDIOGRAM REPORT   Patient Name:   Jane Wilkerson Date of Exam: 12/16/2021 Medical Rec #:  [id number]      Height:       62.0 in Accession #:    [id number]     Weight:       161.4 lb Date of Birth:  [birth year]       BSA:          1.745 m Patient Age:    16 years       BP:           127/79 mmHg Patient Gender: F              HR:           92 bpm. Exam Location:  Inpatient Procedure: 2D Echo, Cardiac Doppler, Color Doppler and Intracardiac  Opacification Agent Indications:    Chest Pain R07.9  History:        Patient has prior history of Echocardiogram examinations, most                 recent 08/29/2015. Non-ST elevated myocardial infarction,                 Arrythmias:SVT and Vtach; Risk Factors:Hypertension and                 Dyslipidemia. GERD.  Sonographer:    Darlina Sicilian RDCS Referring Phys: [place]  1. Left ventricular ejection fraction, by estimation, is 55 to 60%. The left ventricle has normal function. Left ventricular diastolic parameters were normal.  2. Right ventricular systolic function is normal. The right ventricular size is normal.  3. The mitral valve is normal in structure. No evidence of mitral valve regurgitation.  4. The aortic valve is normal in structure. Aortic valve regurgitation is not visualized.  5. The inferior vena cava is normal in size with greater than 50% respiratory variability, suggesting  right atrial pressure of 3 mmHg. FINDINGS  Left Ventricle: Poor acoustic windows apically limit study. NO definite wall motion abnormalities, though endocardium is difficult to see. Left ventricular ejection fraction, by estimation, is 55 to 60%. The left ventricle has normal function. Definity contrast agent was given IV to delineate the left ventricular endocardial borders. The left ventricular internal cavity size was normal in size. There is no left ventricular hypertrophy. Left ventricular diastolic parameters were normal. Right Ventricle: The right ventricular size is normal. Right vetricular wall thickness was not assessed. Right ventricular systolic function is normal. Left Atrium: Left atrial size was normal in size. Right Atrium: Right atrial size was normal in size. Pericardium: There is no evidence of pericardial effusion. Mitral Valve: The mitral valve is normal in structure. No evidence of mitral valve regurgitation. Tricuspid Valve: The tricuspid valve is normal in structure. Tricuspid valve regurgitation is trivial. Aortic Valve: The aortic valve is normal in structure. Aortic valve regurgitation is not visualized. Pulmonic Valve: The pulmonic valve was normal in structure. Pulmonic valve regurgitation is not visualized. Aorta: The aortic root and ascending aorta are structurally normal, with no evidence of dilitation. Venous: The inferior vena cava is normal in size with greater than 50% respiratory variability, suggesting right atrial pressure of 3 mmHg. IAS/Shunts: No atrial level shunt detected by color flow Doppler.  LEFT VENTRICLE PLAX 2D LVIDd:         3.80 cm   Diastology LVIDs:         2.80 cm   LV e' medial:    5.84 cm/s LV PW:         0.70 cm   LV E/e' medial:  6.3 LV IVS:        1.10 cm   LV e' lateral:   8.36 cm/s LVOT diam:     1.90 cm   LV E/e' lateral: 4.4 LV SV:         43 LV SV Index:   24 LVOT Area:     2.84 cm  RIGHT VENTRICLE RV S prime:     8.08 cm/s TAPSE (M-mode): 1.7 cm LEFT  ATRIUM             Index LA diam:        3.20 cm 1.83 cm/m LA Vol (A2C):   22.5 ml 12.89 ml/m LA Vol (A4C):   35.1 ml 20.11  ml/m LA Biplane Vol: 28.3 ml 16.22 ml/m  AORTIC VALVE LVOT Vmax:   71.10 cm/s LVOT Vmean:  46.800 cm/s LVOT VTI:    0.150 m  AORTA Ao Root diam: 2.50 cm Ao Asc diam:  3.20 cm MITRAL VALVE MV Area (PHT): 7.90 cm    SHUNTS MV Decel Time: 96 msec     Systemic VTI:  0.15 m MV E velocity: 36.80 cm/s  Systemic Diam: 1.90 cm MV A velocity: 79.80 cm/s MV E/A ratio:  0.46 Dorris Carnes MD Electronically signed by Dorris Carnes MD Signature Date/Time: 12/16/2021/11:35:17 AM    Final    CT Angio Chest PE W and/or Wo Contrast  Result Date: 12/15/2021 CLINICAL DATA:  Chest pain with shortness of breath. EXAM: CT ANGIOGRAPHY CHEST WITH CONTRAST TECHNIQUE: Multidetector CT imaging of the chest was performed using the standard protocol during bolus administration of intravenous contrast. Multiplanar CT image reconstructions and MIPs were obtained to evaluate the vascular anatomy. RADIATION DOSE REDUCTION: This exam was performed according to the departmental dose-optimization program which includes automated exposure control, adjustment of the mA and/or kV according to patient size and/or use of iterative reconstruction technique. CONTRAST:  23m OMNIPAQUE IOHEXOL 350 MG/ML SOLN COMPARISON:  CT angiogram chest 10/14/2016. FINDINGS: Cardiovascular: Satisfactory opacification of the pulmonary arteries to the segmental level. No evidence of pulmonary embolism. Normal heart size. No pericardial effusion. Mediastinum/Nodes: No enlarged mediastinal, hilar, or axillary lymph nodes. Thyroid gland, trachea, and esophagus demonstrate no significant findings. Lungs/Pleura: Lungs are clear. No pleural effusion or pneumothorax. Upper Abdomen: Peripheral splenic calcifications are unchanged. There is a rounded cyst in the spleen measuring 2 cm. Small hiatal hernia is present. Musculoskeletal: Focal density in the medial  right breast measuring 1.1 x 3.0 cm appears unchanged. No acute fractures are seen. Review of the MIP images confirms the above findings. IMPRESSION: 1. No evidence for pulmonary embolism. 2. No acute cardiopulmonary process. Electronically Signed   By: ARonney AstersM.D.   On: 12/15/2021 16:26   DG Chest Port 1 View  Result Date: 12/15/2021 CLINICAL DATA:  Chest pain and shortness of breath. EXAM: PORTABLE CHEST 1 VIEW COMPARISON:  04/17/2021 FINDINGS: The cardiac silhouette, mediastinal and hilar contours are within normal limits and stable. Stable eventration of the right hemidiaphragm. No acute pulmonary findings. No pleural effusions or pulmonary lesions. The bony thorax is intact. IMPRESSION: No acute cardiopulmonary findings. Electronically Signed   By: PMarijo SanesM.D.   On: 12/15/2021 12:38   MM DIAG BREAST TOMO UNI RIGHT  Result Date: 12/02/2021 CLINICAL DATA:  Screening recall for a right breast asymmetry. The patient has history of right breast lumpectomy in January of 2013. EXAM: DIGITAL DIAGNOSTIC UNILATERAL RIGHT MAMMOGRAM WITH TOMOSYNTHESIS AND CAD; ULTRASOUND RIGHT BREAST LIMITED TECHNIQUE: Right digital diagnostic mammography and breast tomosynthesis was performed. The images were evaluated with computer-aided detection.; Targeted ultrasound examination of the right breast was performed COMPARISON:  Previous exam(s). ACR Breast Density Category b: There are scattered areas of fibroglandular density. FINDINGS: A repeat full paddle MLO series of images over the asymmetry along the anterior margin of the lumpectomy site in the central posterior right breast demonstrates that the site appears stable for several years at least back to 2018. No suspicious findings are seen on the spot compression tomosynthesis images through this area or the full paddle true lateral view. Out of precaution, ultrasound was performed of the lumpectomy site in the right breast demonstrating expected surgical  scarring. No suspicious masses are identified  surrounding the lumpectomy site. IMPRESSION: 1. Resolution of the asymmetry near the lumpectomy site in the right breast. No evidence of right breast malignancy. RECOMMENDATION: Screening mammogram in one year.(Code:SM-B-01Y) I have discussed the findings and recommendations with the patient. If applicable, a reminder letter will be sent to the patient regarding the next appointment. BI-RADS CATEGORY  2: Benign. Electronically Signed   By: Ammie Ferrier M.D.   On: 12/02/2021 10:10   US BREAST LTD UNI RIGHT INC AXILLA  Result Date: 12/02/2021 CLINICAL DATA:  Screening recall for a right breast asymmetry. The patient has history of right breast lumpectomy in January of 2013. EXAM: DIGITAL DIAGNOSTIC UNILATERAL RIGHT MAMMOGRAM WITH TOMOSYNTHESIS AND CAD; ULTRASOUND RIGHT BREAST LIMITED TECHNIQUE: Right digital diagnostic mammography and breast tomosynthesis was performed. The images were evaluated with computer-aided detection.; Targeted ultrasound examination of the right breast was performed COMPARISON:  Previous exam(s). ACR Breast Density Category b: There are scattered areas of fibroglandular density. FINDINGS: A repeat full paddle MLO series of images over the asymmetry along the anterior margin of the lumpectomy site in the central posterior right breast demonstrates that the site appears stable for several years at least back to 2018. No suspicious findings are seen on the spot compression tomosynthesis images through this area or the full paddle true lateral view. Out of precaution, ultrasound was performed of the lumpectomy site in the right breast demonstrating expected surgical scarring. No suspicious masses are identified surrounding the lumpectomy site. IMPRESSION: 1. Resolution of the asymmetry near the lumpectomy site in the right breast. No evidence of right breast malignancy. RECOMMENDATION: Screening mammogram in one year.(Code:SM-B-01Y) I have  discussed the findings and recommendations with the patient. If applicable, a reminder letter will be sent to the patient regarding the next appointment. BI-RADS CATEGORY  2: Benign. Electronically Signed   By: Ammie Ferrier M.D.   On: 12/02/2021 10:10  MM 3D SCREEN BREAST BILATERAL  Result Date: 11/24/2021 CLINICAL DATA:  Screening. EXAM: DIGITAL SCREENING BILATERAL MAMMOGRAM WITH TOMOSYNTHESIS AND CAD TECHNIQUE: Bilateral screening digital craniocaudal and mediolateral oblique mammograms were obtained. Bilateral screening digital breast tomosynthesis was performed. The images were evaluated with computer-aided detection. COMPARISON:  Previous exam(s). ACR Breast Density Category b: There are scattered areas of fibroglandular density. FINDINGS: In the right breast, a possible asymmetry warrants further evaluation. In the left breast, no findings suspicious for malignancy. IMPRESSION: Further evaluation is suggested for possible asymmetry in the right breast. RECOMMENDATION: Diagnostic mammogram and possibly ultrasound of the right breast. (Code:[id number]) The patient will be contacted regarding the findings, and additional imaging will be scheduled. BI-RADS CATEGORY  0: Incomplete. Need additional imaging evaluation and/or prior mammograms for comparison. Electronically Signed   By: Marin Olp M.D.   On: 11/24/2021 11:45     Subjective: No acute issues or events overnight denies nausea vomiting diarrhea constipation headache fevers chills or chest pain   Discharge Exam: Vitals:   12/17/21 1326 12/17/21 1337  BP: (!) 98/57 (!) 101/59  Pulse: 86   Resp: 19 16  Temp: 98.1 F (36.7 C)   SpO2: 94%    Vitals:   12/17/21 0342 12/17/21 0740 12/17/21 1326 12/17/21 1337  BP: (!) 153/88  (!) 98/57 (!) 101/59  Pulse: 91  86   Resp: '19  19 16  '$ Temp: 98.5 F (36.9 C)  98.1 F (36.7 C)   TempSrc: Oral  Oral   SpO2: 93% 96% 94%   Weight:      Height:  General: Pt is alert, awake, not  in acute distress Cardiovascular: RRR, S1/S2 +, no rubs, no gallops Respiratory: CTA bilaterally, no wheezing, no rhonchi Abdominal: Soft, NT, ND, bowel sounds + Extremities: no edema, no cyanosis    The results of significant diagnostics from this hospitalization (including imaging, microbiology, ancillary and laboratory) are listed below for reference.     Microbiology: Recent Results (from the past 240 hour(s))  Surgical PCR screen     Status: None   Collection Time: 12/16/21 10:04 AM   Specimen: Nasal Mucosa; Nasal Swab  Result Value Ref Range Status   MRSA, PCR NEGATIVE NEGATIVE Final   Staphylococcus aureus NEGATIVE NEGATIVE Final    Comment: (NOTE) The Xpert SA Assay (FDA approved for NASAL specimens in patients 24 years of age and older), is one component of a comprehensive surveillance program. It is not intended to diagnose infection nor to guide or monitor treatment. Performed at [facility], [place] [address], [place], [place] [address]      Labs: BNP (last 3 results) No results for input(s): "BNP" in the last 8760 hours. Basic Metabolic Panel: Recent Labs  Lab 12/15/21 1211 12/15/21 1951 12/16/21 0020 12/17/21 0240  NA 139  --  139 136  K 3.4*  --  4.4 3.6  CL 108  --  107 101  CO2 25  --  26 24  GLUCOSE 148*  --  95 114*  BUN 12  --  11 13  CREATININE 1.26*  --  1.05* 1.22*  CALCIUM 8.3*  --  8.6* 8.8*  MG  --  2.1  --   --    Liver Function Tests: No results for input(s): "AST", "ALT", "ALKPHOS", "BILITOT", "PROT", "ALBUMIN" in the last 168 hours. No results for input(s): "LIPASE", "AMYLASE" in the last 168 hours. No results for input(s): "AMMONIA" in the last 168 hours. CBC: Recent Labs  Lab 12/15/21 1211 12/16/21 0020 12/17/21 0240  WBC 6.5 10.2 8.4  HGB 12.9 11.7* 11.8*  HCT 40.8 36.4 36.1  MCV 90.9 90.3 87.4  PLT 299 274 268   Cardiac Enzymes: No results for input(s): "CKTOTAL", "CKMB", "CKMBINDEX", "TROPONINI" in the last 168  hours. BNP: Invalid input(s): "POCBNP" CBG: No results for input(s): "GLUCAP" in the last 168 hours. D-Dimer No results for input(s): "DDIMER" in the last 72 hours. Hgb A1c Recent Labs    12/17/21 0240  HGBA1C 6.0*   Lipid Profile Recent Labs    12/16/21 0936  CHOL 151  HDL 50  LDLCALC 61  TRIG 202*  CHOLHDL 3.0   Thyroid function studies No results for input(s): "TSH", "T4TOTAL", "T3FREE", "THYROIDAB" in the last 72 hours.  Invalid input(s): "FREET3" Anemia work up No results for input(s): "VITAMINB12", "FOLATE", "FERRITIN", "TIBC", "IRON", "RETICCTPCT" in the last 72 hours. Urinalysis No results found for: "COLORURINE", "APPEARANCEUR", "LABSPEC", "PHURINE", "GLUCOSEU", "HGBUR", "BILIRUBINUR", "KETONESUR", "PROTEINUR", "UROBILINOGEN", "NITRITE", "LEUKOCYTESUR" Sepsis Labs Recent Labs  Lab 12/15/21 1211 12/16/21 0020 12/17/21 0240  WBC 6.5 10.2 8.4   Microbiology Recent Results (from the past 240 hour(s))  Surgical PCR screen     Status: None   Collection Time: 12/16/21 10:04 AM   Specimen: Nasal Mucosa; Nasal Swab  Result Value Ref Range Status   MRSA, PCR NEGATIVE NEGATIVE Final   Staphylococcus aureus NEGATIVE NEGATIVE Final    Comment: (NOTE) The Xpert SA Assay (FDA approved for NASAL specimens in patients 41 years of age and older), is one component of a comprehensive surveillance program. It  is not intended to diagnose infection nor to guide or monitor treatment. Performed at [facility], [place] [address] [place], [place] [address]      Time coordinating discharge: Over 30 minutes  SIGNED:   Little Ishikawa, DO [facility] 12/17/2021, 4:46 PM Pager   If 7PM-7AM, please contact night-coverage [email or web address]

## 2021-12-17 NOTE — Progress Notes (Signed)
Ambulated with patient around the unit at around 10:30 am; patient started seeing "zigzag lines, spots" after ambulation. VS stable. Neuro check at baseline. NIHSS negative. MD notified; will monitor.

## 2021-12-17 NOTE — Interval H&P Note (Signed)
Cath Lab Visit (complete for each Cath Lab visit)  Clinical Evaluation Leading to the Procedure:   ACS: Yes.    Non-ACS:    Anginal Classification: CCS III  Anti-ischemic medical therapy: Maximal Therapy (2 or more classes of medications)  Non-Invasive Test Results: No non-invasive testing performed  Prior CABG: No previous CABG      History and Physical Interval Note:  12/17/2021 7:45 AM  Jane Wilkerson  has presented today for surgery, with the diagnosis of nstemi.  The various methods of treatment have been discussed with the patient and family. After consideration of risks, benefits and other options for treatment, the patient has consented to  Procedure(s): LEFT HEART CATH AND CORONARY ANGIOGRAPHY (N/A) as a surgical intervention.  The patient's history has been reviewed, patient examined, no change in status, stable for surgery.  I have reviewed the patient's chart and labs.  Questions were answered to the patient's satisfaction.     Shelva Majestic

## 2021-12-17 NOTE — Progress Notes (Signed)
Patient's vision changes resolved spontaneously. Received order to discharge patient. Discharge instructions discussed with patient, husband and daughter at bedside; verbalized understanding.  PIV d/cd.

## 2021-12-18 ENCOUNTER — Inpatient Hospital Stay: Payer: Medicare PPO | Admitting: Adult Health

## 2021-12-18 MED FILL — Verapamil HCl IV Soln 2.5 MG/ML: INTRAVENOUS | Qty: 2 | Status: AC

## 2021-12-21 ENCOUNTER — Ambulatory Visit: Payer: Self-pay | Admitting: *Deleted

## 2021-12-21 NOTE — Telephone Encounter (Signed)
  Chief Complaint: low bp Symptoms:a little dizzy when stands Frequency: when stands Pertinent Negatives: Patient denies na Disposition: '[]'$ ED /'[]'$ Urgent Care (no appt availability in office) / '[x]'$ Appointment(In office/virtual)/ '[]'$  [facility]/ '[]'$ Home Care/ '[]'$ Refused Recommended Disposition /'[]'$  Mobile Bus/ '[]'$  Follow-up with PCP Additional Notes: Community line call. Pt bp 103/70 and now she has taken her two bp meds. She had cardiac cath Friday and is supposed to see her MD this week. Instructed to call and get parameters for holding bp meds according to bp reading. Continue with plan to see MD this week for a post hospital visit.   Reason for Disposition  [9] Fall in systolic BP > 20 mm Hg from normal AND [2] NOT dizzy, lightheaded, or weak  Answer Assessment - Initial Assessment Questions 1. BLOOD PRESSURE: "What is the blood pressure?" "Did you take at least two measurements 5 minutes apart?"     103/70 HR 105 2. ONSET: "When did you take your blood pressure?"     This morning. 3. HOW: "How did you obtain the blood pressure?" (e.g., visiting nurse, automatic home BP monitor)     Home machine 4. HISTORY: "Do you have a history of low blood pressure?" "What is your blood pressure normally?"     no 5. MEDICATIONS: "Are you taking any medications for blood pressure?" If Yes, ask: "Have they been changed recently?"     Takes two bp pills daily  6. PULSE RATE: "Do you know what your pulse rate is?"      105 7. OTHER SYMPTOMS: "Have you been sick recently?" "Have you had a recent injury?"     Had cardiac cath 4 days ago. 8. PREGNANCY: "Is there any [place] you are pregnant?" "When was your last menstrual period?"     no  Protocols used: Blood Pressure - Low-A-AH

## 2021-12-24 ENCOUNTER — Ambulatory Visit (INDEPENDENT_AMBULATORY_CARE_PROVIDER_SITE_OTHER): Payer: Medicare PPO | Admitting: Family

## 2021-12-24 ENCOUNTER — Encounter (HOSPITAL_BASED_OUTPATIENT_CLINIC_OR_DEPARTMENT_OTHER): Payer: Self-pay | Admitting: Family

## 2021-12-24 VITALS — BP 131/81 | HR 91 | Ht 62.0 in | Wt 160.1 lb

## 2021-12-24 DIAGNOSIS — I25118 Atherosclerotic heart disease of native coronary artery with other forms of angina pectoris: Secondary | ICD-10-CM | POA: Diagnosis not present

## 2021-12-24 DIAGNOSIS — E785 Hyperlipidemia, unspecified: Secondary | ICD-10-CM | POA: Diagnosis not present

## 2021-12-24 DIAGNOSIS — I471 Supraventricular tachycardia: Secondary | ICD-10-CM | POA: Diagnosis not present

## 2021-12-24 MED ORDER — CARVEDILOL 6.25 MG PO TABS: 6.2500 mg | ORAL_TABLET | Freq: Two times a day (BID) | ORAL | 3 refills | Status: DC

## 2021-12-24 NOTE — Progress Notes (Unsigned)
Office Visit    Patient Name: Jane Wilkerson Date of Encounter: 12/24/2021  PCP:  [facility], [facility]  Cardiologist:  Pixie Casino, MD  Advanced Practice Provider:  No care team member to display Electrophysiologist:  None      Chief Complaint    Jane Wilkerson is a 81 y.o. female with a hx of *** presents today for hospital follow up   Past Medical History    Past Medical History:  Diagnosis Date   Allergy    Anxiety 07/07/2011   takes Ativan every 4hrs   Arthritis    back    Asthma    uses inhaler prn   Breast cancer ([place])    right - local excision, chemotheraphy   Cataract    right eye   Chronic back pain    Depression    has appt to discuss depression 07/26/2011   Diverticulosis    Dizziness    occasionally   Gastric ulcer    GERD (gastroesophageal reflux disease)    takes Nexium daily   Hemorrhoids    History of nuclear stress test 11/2010   lexiscan; no evidence of inducible ischemia; normal pattern of perfusion    History of shingles 2011   Hx of colonic polyps    Hyperlipidemia    takes Zocor daily   Hypertension    takes Bystolic and Losartan daily   Hypothyroidism    takes Synthroid daily   Nausea alone    onset last Wednesday  08/04/11   Nocturia    Osteoporosis    PONV (postoperative nausea and vomiting)    PVC's (premature ventricular contractions)    Shortness of breath    with exertion   Skin rash    Urinary frequency    Urticaria of unknown origin    Past Surgical History:  Procedure Laterality Date   ABDOMINAL HYSTERECTOMY  2001   BREAST LUMPECTOMY Right 07/02/11   right breast and SNL   BREAST SURGERY Left 1972   left lumpectomy   BREAST SURGERY Right 08/11/11   margins on rt breast   CATARACT EXTRACTION W/PHACO Right 08/31/2016   Procedure: CATARACT EXTRACTION PHACO AND INTRAOCULAR LENS PLACEMENT ([place]);  Surgeon: Rutherford Guys, MD;  Location: [facility];  Service: Ophthalmology;  Laterality:  Right;  CDE: 8.46   CATARACT EXTRACTION W/PHACO Left 09/14/2016   Procedure: CATARACT EXTRACTION PHACO AND INTRAOCULAR LENS PLACEMENT ([facility]);  Surgeon: Rutherford Guys, MD;  Location: [facility];  Service: Ophthalmology;  Laterality: Left;  CDE: 9.32   COLONOSCOPY     colonscopy     HEMORRHOID SURGERY     LEFT HEART CATH AND CORONARY ANGIOGRAPHY N/A 12/17/2021   Procedure: LEFT HEART CATH AND CORONARY ANGIOGRAPHY;  Surgeon: Troy Sine, MD;  Location: [facility];  Service: Cardiovascular;  Laterality: N/A;   TRANSTHORACIC ECHOCARDIOGRAM  2009   normal LV & RV systolic function; mild mitral annular calcif & mild MR; trace TR    Allergies  Allergies  Allergen Reactions   Bisoprolol-Hydrochlorothiazide Other (See Comments)    Reaction unknown Reaction unknown   Hydrocodone Other (See Comments)    Reaction unknown Reaction unknown Reaction unknown   Oxycodone Itching   Oxycodone Hcl Itching   Ziac [Bisoprolol-Hydrochlorothiazide] Other (See Comments)    Reaction unknown   Penicillins Rash    Has patient had a PCN reaction causing immediate rash, facial/tongue/throat swelling, SOB or lightheadedness with hypotension: unknown Has  patient had a PCN reaction causing severe rash involving mucus membranes or skin necrosis: unknown Has patient had a PCN reaction that required hospitalization No Has patient had a PCN reaction occurring within the last 10 years: No If all of the above answers are "NO", then may proceed with Cephalosporin use.     History of Present Illness    Jane Wilkerson is a 81 y.o. female with a hx of nonobstructive coronary disease, breast cancer, hypothyroidism, hypertension, thoracic aneurysm *** last seen while hospitalized.  Tells me Tuesday night she got a little nauseous and felt chills. She took some Tylenol. She did have a temperature of 100F.   Some sore -   EKGs/Labs/Other Studies Reviewed:   The following studies were reviewed today: ***  EKG:   No EKG today  Recent Labs: 12/15/2021: Magnesium 2.1 12/17/2021: BUN 13; Creatinine, Ser 1.22; Hemoglobin 11.8; Platelets 268; Potassium 3.6; Sodium 136  Recent Lipid Panel    Component Value Date/Time   CHOL 151 12/16/2021 0936   CHOL 169 05/11/2013 1021   TRIG 202 (H) 12/16/2021 0936   TRIG 231 (H) 05/11/2013 1021   HDL 50 12/16/2021 0936   HDL 47 05/11/2013 1021   CHOLHDL 3.0 12/16/2021 0936   VLDL 40 12/16/2021 0936   LDLCALC 61 12/16/2021 0936   LDLCALC 76 05/11/2013 1021    Risk Assessment/Calculations:  {Does this patient have ATRIAL FIBRILLATION?:[id number]}  Home Medications   Current Meds  Medication Sig   albuterol (VENTOLIN HFA) 108 (90 Base) MCG/ACT inhaler Inhale 1-2 puffs into the lungs every 4 (four) hours as needed for wheezing or shortness of breath.   amLODipine (NORVASC) 5 MG tablet TAKE 1 TABLET(5 MG) BY MOUTH DAILY (Patient taking differently: Take 5 mg by mouth daily.)   aspirin 81 MG chewable tablet Chew 1 tablet (81 mg total) by mouth daily.   atorvastatin (LIPITOR) 80 MG tablet Take 1 tablet (80 mg total) by mouth daily.   carvedilol (COREG) 3.125 MG tablet Take 1 tablet (3.125 mg total) by mouth 2 (two) times daily.   citalopram (CELEXA) 20 MG tablet Take 20 mg by mouth daily.   hydrocortisone cream 0.5 % Apply 1 application topically 2 (two) times daily as needed for itching. For hemorrhoid pain   levothyroxine (SYNTHROID, LEVOTHROID) 50 MCG tablet Take 50 mcg by mouth daily.   LORazepam (ATIVAN) 1 MG tablet Take 1 mg by mouth at bedtime. For anxiety may take extra tablet if needed   nitroGLYCERIN (NITROSTAT) 0.4 MG SL tablet Place 1 tablet (0.4 mg total) under the tongue every 5 (five) minutes as needed for chest pain.   omeprazole (PRILOSEC) 40 MG capsule TAKE 1 CAPSULE BY MOUTH SHORTLY BEFORE BREAKFAST DAILY. (Patient taking differently: Take 40 mg by mouth daily. TAKE 1 CAPSULE BY MOUTH SHORTLY BEFORE BREAKFAST DAILY.)   psyllium (METAMUCIL) 58.6 %  powder Take 1 packet by mouth daily as needed (constipation).   Spacer/Aero-Holding Chambers (AEROCHAMBER PLUS) inhaler Use with inhaler   triamcinolone (KENALOG) 0.1 % Apply 1 application topically daily.   Current Facility-Administered Medications for the 12/24/21 encounter (Office Visit) with Loel Dubonnet, NP  Medication   0.9 %  sodium chloride infusion     Review of Systems   ***   All other systems reviewed and are otherwise negative except as noted above.  Physical Exam    VS:  BP 131/81 (BP Location: Left Arm, Patient Position: Sitting, Cuff Size: Normal)   Pulse 91   Ht  $'5\' 2"'x$  (1.575 m)   Wt 160 lb 1.6 oz (72.6 kg)   SpO2 96%   BMI 29.28 kg/m  , BMI Body mass index is 29.28 kg/m.  Wt Readings from Last 3 Encounters:  12/24/21 160 lb 1.6 oz (72.6 kg)  12/16/21 161 lb 6 oz (73.2 kg)  07/24/21 161 lb 3.2 oz (73.1 kg)     GEN: Well nourished, well developed, in no acute distress. HEENT: normal. Neck: Supple, no JVD, carotid bruits, or masses. Cardiac: ***RRR, no murmurs, rubs, or gallops. No clubbing, cyanosis, edema.  ***Radials/PT 2+ and equal bilaterally.  Respiratory:  ***Respirations regular and unlabored, clear to auscultation bilaterally. GI: Soft, nontender, nondistended. MS: No deformity or atrophy. Skin: Warm and dry, no rash. Neuro:  Strength and sensation are intact. Psych: Normal affect.  Assessment & Plan    CAD - Cardiac catheterization 12/17/21 ost-prox LAD 20% stenosis      Disposition: Follow up {follow up:15908} with Pixie Casino, MD or APP.  Signed, Loel Dubonnet, NP 12/24/2021, 3:03 PM [place]

## 2021-12-24 NOTE — Patient Instructions (Addendum)
Medication Instructions:  Your physician has recommended you make the following change in your medication:   INCREASE your Carvedilol to 6.'25mg'$  twice daily *this is to hep prevent fast heart rates  *If you need a refill on your cardiac medications before your next appointment, please call your pharmacy*   Lab Work: Your physician recommends that you return for lab work in 6 weeks for fasting lipid panel and CMP  Please return for Lab work. You may come to the...   [facility] (3rd floor) [address] [place], [place] [address]  Open: 8am-Noon and 1pm-4:30pm  Please ring the doorbell on the small table when you exit the elevator and the Lab Tech will come get you  Jane Wilkerson at [facility] [address] [place], [place] Wilk[place], [place] [address] Open: 8am-1pm, then 2pm-4:30pm   [place]- Please see attached locations sheet stapled to your lab work with address and hours.    If you have labs (blood work) drawn today and your tests are completely normal, you will receive your results only by: [place] (if you have MyChart) OR A paper copy in the mail If you have any lab test that is abnormal or we need to change your treatment, we will call you to review the results.   Testing/Procedures: Your echocardiogram in the hospital showed your heart muscle was working well and your heart valves was working well!  Your cardiac catheterization showed  only a small plaque buildup. Your Atorvastatin, Aspirin, and Carvedilol help prevent this from getting worse.    Follow-Up: At [facility], you and your health needs are our priority.  As part of our continuing mission to provide you with exceptional heart care, we have created designated Provider Care Teams.  These Care Teams include your primary Cardiologist (physician) and Advanced Practice Providers (APPs -  Physician Assistants and Nurse Practitioners) who all work together to provide you with  the care you need, when you need it.  We recommend signing up for the patient portal called "MyChart".  Sign up information is provided on this After Visit Summary.  MyChart is used to connect with patients for Virtual Visits (Telemedicine).  Patients are able to view lab/test results, encounter notes, upcoming appointments, etc.  Non-urgent messages can be sent to your provider as well.   To learn more about what you can do with MyChart, go to [email or web address].    Your next appointment:   3 month(s)  The format for your next appointment:   In Person  Provider:   You may see Pixie Casino, MD or one of the following Advanced Practice Providers on your designated Care Team:   Bernerd Pho, PA-C  Ermalinda Barrios, PA-C  Melina Copa, PA-C  Other Instructions  Heart Healthy Diet Recommendations: A low-salt diet is recommended. Meats should be grilled, baked, or boiled. Avoid fried foods. Focus on lean protein sources like fish or chicken with vegetables and fruits. The American Heart Association is a Microbiologist!  American Heart Association Diet and Lifeystyle Recommendations   Exercise recommendations: The American Heart Association recommends 150 minutes of moderate intensity exercise weekly. Try 30 minutes of moderate intensity exercise 4-5 times per week. This could include walking, jogging, or swimming.  Important Information About Sugar     Supraventricular Tachycardia, Adult Supraventricular tachycardia (SVT) is a kind of abnormal heartbeat. It makes your heart beat very fast. This may last for a short time and then return to normal, or it may  last longer. A normal resting heartbeat is 60-100 times a minute. This condition can make your heart beat more than 150 times a minute. Times of having a fast heartbeat (episodes) can be scary, but they are usually not dangerous. In some cases, they may lead to heart failure if they: Happen many times a day. Last longer than a  few seconds. What are the causes?  This condition happens when electrical signals are sent out from areas of the heart that do not normally send signals for the heartbeat. What increases the risk? You are more likely to develop this condition if you are: Middle aged or younger. Female. The following factors may also make you more likely to develop this condition: Stress. Feeling worried or nervous (anxiety). Tiredness. Smoking. Stimulant drugs, such as cocaine and methamphetamine. Alcohol. Caffeine. Pregnancy. Having certain medical conditions. What are the signs or symptoms? A pounding heart. A feeling that your heart is skipping beats (palpitations). Weakness. Trouble getting enough air. Pain or tightness in your chest. Dizziness or feeling like you are going to pass out (faint). Feeling worried or nervous. Sweating. Feeling like you may vomit (nausea). Passing out. Tiredness. Sometimes, there are no symptoms. How is this treated? Treatment may include: Vagal nerve stimulation. Ways to do this include: Holding your breath and pushing, as though you are pooping (having a bowel movement). Massaging an area on one side of your neck. Do not try this yourself. Only a doctor should do this. If done the wrong way, it can lead to a stroke. Bending forward with your head between your legs. Coughing while bending forward with your head between your legs. Putting an ice-cold, wet towel on your face. Medicines that prevent attacks. Medicine to stop an attack given through an IV tube at the hospital. A small electric shock (cardioversion) that stops an attack. A procedure to get rid of cells in the area that is causing the fast heartbeats (radiofrequency ablation). If you do not have symptoms, you may not need treatment. Follow these instructions at home: Stress Avoid things that make you feel stressed. To deal with stress, try: Doing yoga or meditation. Being out in  nature. Listening to relaxing music. Doing deep breathing. Taking steps to be healthy, such as getting lots of sleep, exercising, and eating a balanced diet. Talking with a mental health doctor. Lifestyle  Try to get at least 7 hours of sleep each night. Do not smoke or use any products that contain nicotine or tobacco. If you need help quitting, ask your doctor. Do not drink alcohol if it gives you a fast heartbeat. If alcohol does not seem to give you a fast heartbeat, limit your alcohol use. If you drink alcohol: Limit how much you have to: 0-1 drink a day for women who are not pregnant. 0-2 drinks a day for men. Know how much alcohol is in your drink. In the [place] one drink equals one 12 oz bottle of beer (355 mL), one 5 oz glass of wine (148 mL), or one 1 oz glass of hard liquor (44 mL). Be aware of how caffeine affects you. If caffeine gives you a fast heartbeat, do not eat, drink, or use anything with caffeine in it. If caffeine does not seem to give you a fast heartbeat, limit how much caffeine you eat, drink, or use. Do not use stimulant drugs. If you need help quitting, ask your doctor. General instructions Stay at a healthy weight. Exercise regularly. Ask your doctor about good  activities for you. Try one or a mixture of these: 150 minutes a week of gentle exercise, like walking or yoga. 75 minutes a week of exercise that is very active, like running or swimming. Do vagus nerve treatments to slow down your heartbeat as told by your doctor. Take over-the-counter and prescription medicines only as told by your doctor. Keep all follow-up visits. Contact a doctor if: You have a fast heartbeat more often. Times of having a fast heartbeat last longer than before. Home treatments to slow down your heartbeat do not help. You have new symptoms. Get help right away if: You have chest pain. Your symptoms get worse. You have trouble breathing. Your heart beats very fast for more  than 20 minutes. You pass out. These symptoms may be an emergency. Get medical help right away. Call your local emergency services (911 in the [place] Do not wait to see if the symptoms will go away. Do not drive yourself to the hospital. Summary SVT is a type of abnormal heartbeat. This condition can make your heart beat more than 150 times a minute. If you do not have symptoms, you may not need treatment. This information is not intended to replace advice given to you by your health care provider. Make sure you discuss any questions you have with your health care provider. Document Revised: 02/02/2020 Document Reviewed: 02/02/2020 Elsevier Patient Education  [place].

## 2022-01-08 ENCOUNTER — Other Ambulatory Visit: Payer: Self-pay

## 2022-01-08 ENCOUNTER — Telehealth: Payer: Self-pay | Admitting: Surgery

## 2022-01-08 ENCOUNTER — Encounter: Payer: Self-pay | Admitting: Adult Health

## 2022-01-08 ENCOUNTER — Inpatient Hospital Stay: Payer: Medicare PPO | Attending: Adult Health | Admitting: Adult Health

## 2022-01-08 VITALS — BP 121/72 | HR 85 | Temp 97.7°F | Wt 157.9 lb

## 2022-01-08 DIAGNOSIS — Z9223 Personal history of estrogen therapy: Secondary | ICD-10-CM | POA: Insufficient documentation

## 2022-01-08 DIAGNOSIS — Z1231 Encounter for screening mammogram for malignant neoplasm of breast: Secondary | ICD-10-CM | POA: Diagnosis not present

## 2022-01-08 DIAGNOSIS — Z170 Estrogen receptor positive status [ER+]: Secondary | ICD-10-CM | POA: Insufficient documentation

## 2022-01-08 DIAGNOSIS — Z853 Personal history of malignant neoplasm of breast: Secondary | ICD-10-CM | POA: Insufficient documentation

## 2022-01-08 DIAGNOSIS — Z923 Personal history of irradiation: Secondary | ICD-10-CM | POA: Diagnosis not present

## 2022-01-08 NOTE — Progress Notes (Unsigned)
[facility]:    [facility], [place] [address]   DIAGNOSIS: Cancer Staging  Malignant neoplasm of upper-outer quadrant of right female breast [facility]) Staging form: Breast, AJCC 7th Edition - Pathologic: Stage 0 (Tis (DCIS), N0, cM0) - Signed by Rulon Eisenmenger, MD on 05/06/2014   SUMMARY OF ONCOLOGIC HISTORY: Oncology History  Malignant neoplasm of upper-outer quadrant of right female breast ([place])  07/02/2011 Surgery   Right lumpectomy: DCIS ER 100%, PR 0%   07/23/2011 - 08/20/2011 Radiation Therapy   Adjuvant XRT   11/19/2011 - 11/30/2016 Anti-estrogen oral therapy   Aromasin 25 mg daily X 1 month then changed to Arimidex 1 mg daily     CURRENT THERAPY: observation  INTERVAL HISTORY: Jane Wilkerson 81 y.o. female returns for follow-up of her history of right-sided breast cancer.  She underwent a bilateral screening mammogram on Nov 24, 2021 that showed asymmetry in the right breast and diagnostic mammogram was recommended.  Right-sided diagnostic mammogram and ultrasound showed no concerning asymmetry near the lumpectomy site in the right breast and no evidence of right breast malignancy screening mammogram was recommended in 1 year.  Of note her breast density was category B.  Lativia had a MI earlier this year.  The episode frightened her, however she has recovered and is doing well since then.  She is seeing Dr. Debara Pickett in cardiology.  She walks around and is taking her medications as directed.  She is up to date with her cancer screenings.     Patient Active Problem List   Diagnosis Date Noted   NSTEMI (non-ST elevated myocardial infarction) ([place]) 12/15/2021   Upper airway cough syndrome 11/19/2019   Fracture of left great toe 11/19/17 12/22/2017   Fracture of left great toe 12/22/2017   Dilatation of thoracic aorta ([place]) 10/04/2016   Hypothyroidism 10/04/2016   Essential hypertension 10/04/2016   Chest pressure  08/15/2015   DOE (dyspnea on exertion) 08/15/2015   Dyslipidemia 05/08/2013   Palpitations 05/08/2013   Anxiety 07/07/2011   Intraductal carcinoma in situ of right breast 06/16/2011   Malignant neoplasm of upper-outer quadrant of right female breast ([place]) 06/16/2011    is allergic to bisoprolol-hydrochlorothiazide, hydrocodone, oxycodone, oxycodone hcl, ziac [bisoprolol-hydrochlorothiazide], and penicillins.  MEDICAL HISTORY: Past Medical History:  Diagnosis Date   Allergy    Anxiety 07/07/2011   takes Ativan every 4hrs   Arthritis    back    Asthma    uses inhaler prn   Breast cancer ([place])    right - local excision, chemotheraphy   Cataract    right eye   Chronic back pain    Depression    has appt to discuss depression 07/26/2011   Diverticulosis    Dizziness    occasionally   Gastric ulcer    GERD (gastroesophageal reflux disease)    takes Nexium daily   Hemorrhoids    History of nuclear stress test 11/2010   lexiscan; no evidence of inducible ischemia; normal pattern of perfusion    History of shingles 2011   Hx of colonic polyps    Hyperlipidemia    takes Zocor daily   Hypertension    takes Bystolic and Losartan daily   Hypothyroidism    takes Synthroid daily   Nausea alone    onset last Wednesday  08/04/11   Nocturia    Osteoporosis    PONV (postoperative nausea and vomiting)    PVC's (premature ventricular contractions)  Shortness of breath    with exertion   Skin rash    Urinary frequency    Urticaria of unknown origin     SURGICAL HISTORY: Past Surgical History:  Procedure Laterality Date   ABDOMINAL HYSTERECTOMY  2001   BREAST LUMPECTOMY Right 07/02/11   right breast and SNL   BREAST SURGERY Left 1972   left lumpectomy   BREAST SURGERY Right 08/11/11   margins on rt breast   CATARACT EXTRACTION W/PHACO Right 08/31/2016   Procedure: CATARACT EXTRACTION PHACO AND INTRAOCULAR LENS PLACEMENT ([place]);  Surgeon: Rutherford Guys, MD;  Location: [facility];   Service: Ophthalmology;  Laterality: Right;  CDE: 8.46   CATARACT EXTRACTION W/PHACO Left 09/14/2016   Procedure: CATARACT EXTRACTION PHACO AND INTRAOCULAR LENS PLACEMENT ([facility]);  Surgeon: Rutherford Guys, MD;  Location: [facility];  Service: Ophthalmology;  Laterality: Left;  CDE: 9.32   COLONOSCOPY     colonscopy     HEMORRHOID SURGERY     LEFT HEART CATH AND CORONARY ANGIOGRAPHY N/A 12/17/2021   Procedure: LEFT HEART CATH AND CORONARY ANGIOGRAPHY;  Surgeon: Troy Sine, MD;  Location: [place] [facility];  Service: Cardiovascular;  Laterality: N/A;   TRANSTHORACIC ECHOCARDIOGRAM  2009   normal LV & RV systolic function; mild mitral annular calcif & mild MR; trace TR    SOCIAL HISTORY: Social History   Socioeconomic History   Marital status: Married    Spouse name: Not on file   Number of children: Not on file   Years of education: 10    Highest education level: Not on file  Occupational History   Occupation: retired    Fish farm manager: RETIRED  Tobacco Use   Smoking status: Never   Smokeless tobacco: Never  Substance and Sexual Activity   Alcohol use: No   Drug use: No   Sexual activity: Yes    Birth control/protection: Surgical  Other Topics Concern   Not on file  Social History Narrative   Married with three children   [name]- age 40, [place] age 22 and [place] age 8- all reside in [place]   Social Determinants of Health   Financial Resource Strain: Not on file  Food Insecurity: Not on file  Transportation Needs: Not on file  Physical Activity: Not on file  Stress: Not on file  Social Connections: Not on file  Intimate Partner Violence: Not on file    FAMILY HISTORY: Family History  Problem Relation Age of Onset   Colon cancer Mother        also stroke   Pancreatic cancer Father        also heart disease   Diabetes Sister    Hypertension Sister    Anesthesia problems Neg Hx    Hypotension Neg Hx    Malignant hyperthermia Neg Hx    Pseudochol deficiency Neg Hx     Stomach cancer Neg Hx    Rectal cancer Neg Hx     Review of Systems  Constitutional:  Negative for appetite change, chills, fatigue, fever and unexpected weight change.  HENT:   Negative for hearing loss, lump/mass and trouble swallowing.   Eyes:  Negative for eye problems and icterus.  Respiratory:  Positive for shortness of breath (with increased activity, which is her baseline). Negative for chest tightness and cough.   Cardiovascular:  Negative for chest pain, leg swelling and palpitations.  Gastrointestinal:  Negative for abdominal distention, abdominal pain, constipation, diarrhea, nausea and vomiting.  Endocrine: Negative for hot flashes.  Genitourinary:  Negative for difficulty urinating.   Musculoskeletal:  Negative for arthralgias.  Skin:  Negative for itching and rash.  Neurological:  Negative for dizziness, extremity weakness, headaches and numbness.  Hematological:  Negative for adenopathy. Does not bruise/bleed easily.  Psychiatric/Behavioral:  Negative for depression. The patient is not nervous/anxious.       PHYSICAL EXAMINATION  ECOG PERFORMANCE STATUS: 1 - Symptomatic but completely ambulatory  Vitals:   01/08/22 0915  BP: 121/72  Pulse: 85  Temp: 97.7 F (36.5 C)  SpO2: 97%    Physical Exam Constitutional:      General: She is not in acute distress.    Appearance: Normal appearance. She is not toxic-appearing.  HENT:     Head: Normocephalic and atraumatic.  Eyes:     General: No scleral icterus. Cardiovascular:     Rate and Rhythm: Normal rate and regular rhythm.     Pulses: Normal pulses.     Heart sounds: Normal heart sounds.  Pulmonary:     Effort: Pulmonary effort is normal.     Breath sounds: Normal breath sounds.  Chest:     Comments: Right breast s/p lumpectomyno sign of local recurrence, left breast is benign  Abdominal:     General: Abdomen is flat. Bowel sounds are normal. There is no distension.     Palpations: Abdomen is soft.      Tenderness: There is no abdominal tenderness.  Musculoskeletal:        General: No swelling.     Cervical back: Neck supple.  Lymphadenopathy:     Cervical: No cervical adenopathy.  Skin:    General: Skin is warm and dry.     Findings: No rash.  Neurological:     General: No focal deficit present.     Mental Status: She is alert.  Psychiatric:        Mood and Affect: Mood normal.        Behavior: Behavior normal.     LABORATORY DATA:  CBC    Component Value Date/Time   WBC 8.4 12/17/2021 0240   RBC 4.13 12/17/2021 0240   HGB 11.8 (L) 12/17/2021 0240   HGB 11.8 11/07/2014 1020   HCT 36.1 12/17/2021 0240   HCT 35.8 11/07/2014 1020   PLT 268 12/17/2021 0240   PLT 286 11/07/2014 1020   MCV 87.4 12/17/2021 0240   MCV 88.0 11/07/2014 1020   MCH 28.6 12/17/2021 0240   MCHC 32.7 12/17/2021 0240   RDW 13.9 12/17/2021 0240   RDW 14.1 11/07/2014 1020   LYMPHSABS 1.2 08/30/2016 0814   LYMPHSABS 1.0 11/07/2014 1020   MONOABS 1.0 08/30/2016 0814   MONOABS 0.9 11/07/2014 1020   EOSABS 0.3 08/30/2016 0814   EOSABS 0.2 11/07/2014 1020   BASOSABS 0.1 08/30/2016 0814   BASOSABS 0.1 11/07/2014 1020    CMP     Component Value Date/Time   NA 136 12/17/2021 0240   NA 139 11/07/2014 1021   K 3.6 12/17/2021 0240   K 4.3 11/07/2014 1021   CL 101 12/17/2021 0240   CL 100 08/04/2012 0921   CO2 24 12/17/2021 0240   CO2 26 11/07/2014 1021   GLUCOSE 114 (H) 12/17/2021 0240   GLUCOSE 110 11/07/2014 1021   GLUCOSE 109 (H) 08/04/2012 0921   BUN 13 12/17/2021 0240   BUN 12.9 11/07/2014 1021   CREATININE 1.22 (H) 12/17/2021 0240   CREATININE 1.21 (H) 10/04/2016 1022   CREATININE 1.2 (H) 11/07/2014 1021   CALCIUM 8.8 (L) 12/17/2021  0240   CALCIUM 9.1 11/07/2014 1021   PROT 6.7 10/04/2016 1022   PROT 6.7 11/07/2014 1021   ALBUMIN 4.3 10/04/2016 1022   ALBUMIN 3.8 11/07/2014 1021   AST 13 10/04/2016 1022   AST 13 11/07/2014 1021   ALT 11 10/04/2016 1022   ALT 11 11/07/2014 1021    ALKPHOS 62 10/04/2016 1022   ALKPHOS 67 11/07/2014 1021   BILITOT 0.5 10/04/2016 1022   BILITOT 0.41 11/07/2014 1021   GFRNONAA 45 (L) 12/17/2021 0240   GFRAA 49 (L) 08/30/2016 0814      ASSESSMENT and THERAPY PLAN:   No problem-specific Assessment & Plan notes found for this encounter.     All questions were answered. The patient knows to call the clinic with any problems, questions or concerns. We can certainly see the patient much sooner if necessary.  Total encounter time:*** minutes*in face-to-face visit time, chart review, lab review, care coordination, order entry, and documentation of the encounter time.    Wilber Bihari, NP 01/08/22 9:57 AM Medical Oncology and Hematology [facility] [place],  [address] Tel. [phone]    Fax. [phone]  *Total Encounter Time as defined by the Centers for Medicare and Medicaid Services includes, in addition to the face-to-face time of a patient visit (documented in the note above) non-face-to-face time: obtaining and reviewing outside history, ordering and reviewing medications, tests or procedures, care coordination (communications with other health care professionals or caregivers) and documentation in the medical record.

## 2022-01-08 NOTE — Telephone Encounter (Signed)
Per Wilber Bihari, NP, I called the pt to let her know that she does not need the pneumonia vaccine, but that Mendel Ryder recommends for the pt to get the shingles vaccine at her local pharmacy in [place].  Pt verbalized understanding and was told to call the office if she had any concerns.

## 2022-01-12 NOTE — Assessment & Plan Note (Signed)
Jane Wilkerson is here for continued follow-up and surveillance of her history of right-sided breast cancer.  She has no clinical or radiographic sign of breast cancer recurrence.  I recommended that she continue repeat annual mammograms next due in May 2024.  We discussed health maintenance and wellness promotion.  I recommended healthy diet and exercise as she is able and she plans on following up with her cardiologist to see what types of exercise she can do.  Since she lives in [place] she would like to continue annual follow-up however would like to do this in a knee pin and I have sent a referral and message to Wells Guiles our PA who is happy to accommodate this request.

## 2022-01-18 ENCOUNTER — Telehealth: Payer: Self-pay | Admitting: Internal Medicine

## 2022-01-18 MED ORDER — ROSUVASTATIN CALCIUM 40 MG PO TABS
40.0000 mg | ORAL_TABLET | Freq: Every day | ORAL | 3 refills | Status: DC
Start: 2022-01-18 — End: 2022-05-05

## 2022-01-18 NOTE — Telephone Encounter (Signed)
Patient started Lipitor mid June, has had total body itching over a week now.Stopped Lipitor 3 days ago, itching a little better but is still itching. She wonders if it is statin and what meds can she take. No rash, no one else in home is affected.    Please advise.

## 2022-01-18 NOTE — Telephone Encounter (Signed)
I spoke with patient and she will stop Atorvastatin and begin Crestor 40 mg daily. She will also try either zyrtec or allegra.She requested only 30 day fill to make sure she tolerates rosuvastatin.

## 2022-01-18 NOTE — Telephone Encounter (Signed)
Left message to return call 

## 2022-01-18 NOTE — Telephone Encounter (Signed)
Pt c/o medication issue:  1. Name of Medication: atorvastatin (LIPITOR) 80 MG tablet  2. How are you currently taking this medication (dosage and times per day)? Take 1 tablet (80 mg total) by mouth daily  3. Are you having a reaction (difficulty breathing--STAT)? no  4. What is your medication issue? Patient states that she is having a reaction to the medication. It has her itching all over. Stop taking on last Friday 7/14. Please advise

## 2022-01-18 NOTE — Telephone Encounter (Signed)
Pt was changed from simvastatin to atorvastatin on 12/18/21 after she was admitted with NSTEMI. Not sure if itching is coming from the statin vs something else. Ok to stay off the atorvastatin to see if symptoms continue to improve, she can also use an antihistamine like Zyrtec or Allegra to help with the itching. Would have her start rosuvastatin '40mg'$  daily instead to replace her atorvastatin and see if she tolerates it better.

## 2022-02-15 ENCOUNTER — Other Ambulatory Visit (HOSPITAL_COMMUNITY)
Admission: RE | Admit: 2022-02-15 | Discharge: 2022-02-15 | Disposition: A | Discharge status: Home / Self Care | Payer: Medicare PPO | Source: Ambulatory Visit | Attending: Family | Admitting: Family

## 2022-02-15 DIAGNOSIS — E785 Hyperlipidemia, unspecified: Secondary | ICD-10-CM | POA: Insufficient documentation

## 2022-02-15 LAB — COMPREHENSIVE METABOLIC PANEL
ALT: 20 U/L (ref 0–44)
AST: 20 U/L (ref 15–41)
Albumin: 3.7 g/dL (ref 3.5–5.0)
Alkaline Phosphatase: 101 U/L (ref 38–126)
Anion gap: 7 (ref 5–15)
BUN: 17 mg/dL (ref 8–23)
CO2: 29 mmol/L (ref 22–32)
Calcium: 8.7 mg/dL — ABNORMAL LOW (ref 8.9–10.3)
Chloride: 104 mmol/L (ref 98–111)
Creatinine, Ser: 1.16 mg/dL — ABNORMAL HIGH (ref 0.44–1.00)
GFR, Estimated: 48 mL/min — ABNORMAL LOW (ref >60)
Glucose, Bld: 127 mg/dL — ABNORMAL HIGH (ref 70–99)
Potassium: 4.3 mmol/L (ref 3.5–5.1)
Sodium: 140 mmol/L (ref 135–145)
Total Bilirubin: 0.1 mg/dL — ABNORMAL LOW (ref 0.3–1.2)
Total Protein: 6.9 g/dL (ref 6.5–8.1)

## 2022-02-15 LAB — LIPID PANEL
Cholesterol: 130 mg/dL (ref 0–200)
HDL: 61 mg/dL (ref >40)
LDL Cholesterol: 36 mg/dL (ref 0–99)
Total CHOL/HDL Ratio: 2.1 RATIO
Triglycerides: 167 mg/dL — ABNORMAL HIGH (ref <150)
VLDL: 33 mg/dL (ref 0–40)

## 2022-02-17 ENCOUNTER — Telehealth (HOSPITAL_BASED_OUTPATIENT_CLINIC_OR_DEPARTMENT_OTHER): Payer: Self-pay

## 2022-02-17 NOTE — Telephone Encounter (Addendum)
Results called to patient who verbalizes understanding!   ----- Message from Loel Dubonnet, NP sent at 02/15/2022 12:00 PM EDT ----- Stable kidney function. Normal electrolytes, liver enzymes.LDL (Bad cholesterol) at goal! Triglycerides mildly elevated 167 with goal <150. Continue current meds. Recommend reducing intake of carbs, sugars, sweets.

## 2022-03-29 NOTE — Progress Notes (Signed)
Cardiology Office Note:    Date:  04/05/2022   ID:  Jane Wilkerson, DOB [birth year], MRN [id number]  PCP:  [facility], [facility] Cardiologist:  Pixie Casino, MD     Referring MD: [facility], MD   Chief Complaint:  Follow-up and New Patient (Initial Visit)     History of Present Illness:   Jane Wilkerson is a 81 y.o. female with   a hx of nonobstructive coronary disease, breast cancer, SVT, hypothyroidism, hypertension, thoracic aneurysm  last seen while hospitalized.   Admitted 12/15/21 with chest pain and NSTEMI in setting of SVT. Mar[facility] Kitchen Catheterization with nonobstructive coronary disease. Echo normal LVEF, no significant valvular abnormalities.      Patient saw Ms. Walker 12/2021 and doing well. Coreg increased.  Patient comes in for f/u. No further racing heart, chest pain or dyspnea. Fatigue has improved. No regular exercise but cuts her grass, works in yard and goes up/down stairs to [employer].     Past Medical History:  Diagnosis Date   Allergy    Anxiety 07/07/2011   takes Ativan every 4hrs   Arthritis    back    Asthma    uses inhaler prn   Breast cancer ([place])    right - local excision, chemotheraphy   Cataract    right eye   Chronic back pain    Depression    has appt to discuss depression 07/26/2011   Diverticulosis    Dizziness    occasionally   Gastric ulcer    GERD (gastroesophageal reflux disease)    takes Nexium daily   Hemorrhoids    History of nuclear stress test 11/2010   lexiscan; no evidence of inducible ischemia; normal pattern of perfusion    History of shingles 2011   Hx of colonic polyps    Hyperlipidemia    takes Zocor daily   Hypertension    takes Bystolic and Losartan daily   Hypothyroidism    takes Synthroid daily   Nausea alone    onset last Wednesday  08/04/11   Nocturia    Osteoporosis    PONV (postoperative nausea and vomiting)    PVC's (premature ventricular contractions)    Shortness of breath     with exertion   Skin rash    Urinary frequency    Urticaria of unknown origin    Current Medications: Current Meds  Medication Sig   albuterol (VENTOLIN HFA) 108 (90 Base) MCG/ACT inhaler Inhale 1-2 puffs into the lungs every 4 (four) hours as needed for wheezing or shortness of breath.   amLODipine (NORVASC) 5 MG tablet TAKE 1 TABLET(5 MG) BY MOUTH DAILY (Patient taking differently: Take 5 mg by mouth daily.)   aspirin 81 MG chewable tablet Chew 1 tablet (81 mg total) by mouth daily.   carvedilol (COREG) 12.5 MG tablet Take 1 tablet (12.5 mg total) by mouth 2 (two) times daily.   citalopram (CELEXA) 20 MG tablet Take 20 mg by mouth daily.   hydrocortisone cream 0.5 % Apply 1 application topically 2 (two) times daily as needed for itching. For hemorrhoid pain   levothyroxine (SYNTHROID, LEVOTHROID) 50 MCG tablet Take 50 mcg by mouth daily.   LORazepam (ATIVAN) 1 MG tablet Take 1 mg by mouth at bedtime. For anxiety may take extra tablet if needed   nitroGLYCERIN (NITROSTAT) 0.4 MG SL tablet Place 1 tablet (0.4 mg total) under the tongue every 5 (five) minutes as needed for chest pain.  omeprazole (PRILOSEC) 40 MG capsule TAKE 1 CAPSULE BY MOUTH SHORTLY BEFORE BREAKFAST DAILY. (Patient taking differently: Take 40 mg by mouth daily. TAKE 1 CAPSULE BY MOUTH SHORTLY BEFORE BREAKFAST DAILY.)   psyllium (METAMUCIL) 58.6 % powder Take 1 packet by mouth daily as needed (constipation).   rosuvastatin (CRESTOR) 40 MG tablet Take 1 tablet (40 mg total) by mouth daily.   Spacer/Aero-Holding Chambers (AEROCHAMBER PLUS) inhaler Use with inhaler   triamcinolone (KENALOG) 0.1 % Apply 1 application topically daily.   [DISCONTINUED] carvedilol (COREG) 6.25 MG tablet Take 1 tablet (6.25 mg total) by mouth 2 (two) times daily.   Current Facility-Administered Medications for the 04/05/22 encounter (Office Visit) with Imogene Burn, PA-C  Medication   0.9 %  sodium chloride infusion    Allergies:    Bisoprolol-hydrochlorothiazide, Hydrocodone, Lipitor [atorvastatin], Lisinopril, Oxycodone, Oxycodone hcl, Ziac [bisoprolol-hydrochlorothiazide], and Penicillins   Social History   Tobacco Use   Smoking status: Never   Smokeless tobacco: Never  Vaping Use   Vaping Use: Never used  Substance Use Topics   Alcohol use: No   Drug use: No    Family Hx: The patient's family history includes Colon cancer in her mother; Diabetes in her sister; Hypertension in her sister; Pancreatic cancer in her father. There is no history of Anesthesia problems, Hypotension, Malignant hyperthermia, Pseudochol deficiency, Stomach cancer, or Rectal cancer.  ROS   EKGs/Labs/Other Test Reviewed:    EKG:  EKG is  not ordered today.     Recent Labs: 12/15/2021: Magnesium 2.1 12/17/2021: Hemoglobin 11.8; Platelets 268 02/15/2022: ALT 20; BUN 17; Creatinine, Ser 1.16; Potassium 4.3; Sodium 140   Recent Lipid Panel Recent Labs    02/15/22 1025  CHOL 130  TRIG 167*  HDL 61  VLDL 33  LDLCALC 36     Prior CV Studies:    Echo 12/2021  1. Left ventricular ejection fraction, by estimation, is 55 to 60%. The  left ventricle has normal function. Left ventricular diastolic parameters  were normal.   2. Right ventricular systolic function is normal. The right ventricular  size is normal.   3. The mitral valve is normal in structure. No evidence of mitral valve  regurgitation.   4. The aortic valve is normal in structure. Aortic valve regurgitation is  not visualized.   5. The inferior vena cava is normal in size with greater than 50% respiratory variability, suggesting right atrial pressure of 3 mmHg.    LHC 12/2021   Ost LAD to Prox LAD lesion is 20% stenosed.   Non-stenotic Mid LAD to Dist LAD lesion.   No significant coronary obstructive disease with smooth 20% ostial narrowing of the LAD and evidence for a mid LAD intramyocardial segment without muscle bridging.   Normal large dominant left circumflex  vessel with normal small nondominant RCA.   LVEDP 10 mmHg.   RECOMMENDATION: Medical therapy.    Risk Assessment/Calculations/Metrics:              Physical Exam:    VS:  BP 130/78   Pulse 83   Ht '5\' 2"'$  (1.575 m)   Wt 162 lb 12.8 oz (73.8 kg)   SpO2 94%   BMI 29.78 kg/m     Wt Readings from Last 3 Encounters:  04/05/22 162 lb 12.8 oz (73.8 kg)  01/08/22 157 lb 14.4 oz (71.6 kg)  12/24/21 160 lb 1.6 oz (72.6 kg)    Physical Exam  GEN: Well nourished, well developed, in no acute distress  Neck: no JVD, carotid bruits, or masses Cardiac:RRR; no murmurs, rubs, or gallops  Respiratory:  clear to auscultation bilaterally, normal work of breathing GI: soft, nontender, nondistended, + BS Ext: without cyanosis, clubbing, or edema, Good distal pulses bilaterally Neuro:  Alert and Oriented x 3,  Psych: euthymic mood, full affect       ASSESSMENT & PLAN:   No problem-specific Assessment & Plan notes found for this encounter.    CAD - 12/2021 NSTEMI in setting of SVT.  Cardiac catheterization 12/17/21 ost-prox LAD 20% stenosis. No angina. GDMT aspirin, coreg, atorvastatin, PRN nitroglycerin. Heart healthy diet and regular cardiovascular exercise encouraged.    HLD, LDL goal <70 - Continue Atorvastatin '80mg'$  QD. LDL 36, trig 167 02/2022. Eats ice cream regularly-I've asked her to cut back on sugars in her diet.   SVT - Recent admit with SVT.  Coreg increased to 6.'25mg'$  BID LOV. Will increase 12.5 mg bid.            Dispo:  No follow-ups on file.   Medication Adjustments/Labs and Tests Ordered: Current medicines are reviewed at length with the patient today.  Concerns regarding medicines are outlined above.  Tests Ordered: No orders of the defined types were placed in this encounter.  Medication Changes: Meds ordered this encounter  Medications   carvedilol (COREG) 12.5 MG tablet    Sig: Take 1 tablet (12.5 mg total) by mouth 2 (two) times daily.    Dispense:  180 tablet     Refill:  3   Signed, Ermalinda Barrios, PA-C  04/05/2022 1:44 PM    [address], [place], [place]  [address] Phone: [phone]; Fax: [phone]

## 2022-04-05 ENCOUNTER — Ambulatory Visit: Payer: Medicare PPO | Attending: Physician Assistant | Admitting: Physician Assistant

## 2022-04-05 ENCOUNTER — Encounter: Payer: Self-pay | Admitting: Physician Assistant

## 2022-04-05 VITALS — BP 130/78 | HR 83 | Ht 62.0 in | Wt 162.8 lb

## 2022-04-05 DIAGNOSIS — I471 Supraventricular tachycardia, unspecified: Secondary | ICD-10-CM

## 2022-04-05 DIAGNOSIS — I214 Non-ST elevation (NSTEMI) myocardial infarction: Secondary | ICD-10-CM | POA: Diagnosis not present

## 2022-04-05 DIAGNOSIS — E785 Hyperlipidemia, unspecified: Secondary | ICD-10-CM

## 2022-04-05 MED ORDER — CARVEDILOL 12.5 MG PO TABS: 12.5000 mg | ORAL_TABLET | Freq: Two times a day (BID) | ORAL | 3 refills | Status: DC

## 2022-04-05 NOTE — Patient Instructions (Signed)
.  Medication Instructions:   Increase Coreg to 12.5 mg Two Times Daily   *If you need a refill on your cardiac medications before your next appointment, please call your pharmacy*   Lab Work:  NONE   If you have labs (blood work) drawn today and your tests are completely normal, you will receive your results only by: [place] (if you have MyChart) OR A paper copy in the mail If you have any lab test that is abnormal or we need to change your treatment, we will call you to review the results.   Testing/Procedures: NONE    Follow-Up: At [facility], you and your health needs are our priority.  As part of our continuing mission to provide you with exceptional heart care, we have created designated Provider Care Teams.  These Care Teams include your primary Cardiologist (physician) and Advanced Practice Providers (APPs -  Physician Assistants and Nurse Practitioners) who all work together to provide you with the care you need, when you need it.  We recommend signing up for the patient portal called "MyChart".  Sign up information is provided on this After Visit Summary.  MyChart is used to connect with patients for Virtual Visits (Telemedicine).  Patients are able to view lab/test results, encounter notes, upcoming appointments, etc.  Non-urgent messages can be sent to your provider as well.   To learn more about what you can do with MyChart, go to [email or web address].    Your next appointment:   6 month(s)  The format for your next appointment:   In Person  Provider:   Dr. Dellia Cloud     Other Instructions Thank you for choosing [facility]!    Important Information About Sugar

## 2022-05-05 ENCOUNTER — Other Ambulatory Visit: Payer: Self-pay | Admitting: Pharmacist

## 2022-07-13 DIAGNOSIS — I129 Hypertensive chronic kidney disease with stage 1 through stage 4 chronic kidney disease, or unspecified chronic kidney disease: Secondary | ICD-10-CM | POA: Diagnosis not present

## 2022-07-13 DIAGNOSIS — I2510 Atherosclerotic heart disease of native coronary artery without angina pectoris: Secondary | ICD-10-CM | POA: Diagnosis not present

## 2022-07-13 DIAGNOSIS — T452X1D Poisoning by vitamins, accidental (unintentional), subsequent encounter: Secondary | ICD-10-CM | POA: Diagnosis not present

## 2022-07-13 DIAGNOSIS — Z6829 Body mass index (BMI) 29.0-29.9, adult: Secondary | ICD-10-CM | POA: Diagnosis not present

## 2022-07-13 DIAGNOSIS — N1832 Chronic kidney disease, stage 3b: Secondary | ICD-10-CM | POA: Diagnosis not present

## 2022-07-14 DIAGNOSIS — F4329 Adjustment disorder with other symptoms: Secondary | ICD-10-CM | POA: Diagnosis not present

## 2022-07-26 DIAGNOSIS — F4329 Adjustment disorder with other symptoms: Secondary | ICD-10-CM | POA: Diagnosis not present

## 2022-08-03 DIAGNOSIS — F4329 Adjustment disorder with other symptoms: Secondary | ICD-10-CM | POA: Diagnosis not present

## 2022-08-11 DIAGNOSIS — F4329 Adjustment disorder with other symptoms: Secondary | ICD-10-CM | POA: Diagnosis not present

## 2022-08-18 DIAGNOSIS — F4329 Adjustment disorder with other symptoms: Secondary | ICD-10-CM | POA: Diagnosis not present

## 2022-08-24 DIAGNOSIS — F4329 Adjustment disorder with other symptoms: Secondary | ICD-10-CM | POA: Diagnosis not present

## 2022-09-23 ENCOUNTER — Ambulatory Visit: Payer: Medicare PPO | Admitting: Internal Medicine

## 2022-09-24 DIAGNOSIS — K219 Gastro-esophageal reflux disease without esophagitis: Secondary | ICD-10-CM | POA: Diagnosis not present

## 2022-09-24 DIAGNOSIS — I872 Venous insufficiency (chronic) (peripheral): Secondary | ICD-10-CM | POA: Diagnosis not present

## 2022-09-24 DIAGNOSIS — G9332 Myalgic encephalomyelitis/chronic fatigue syndrome: Secondary | ICD-10-CM | POA: Diagnosis not present

## 2022-09-24 DIAGNOSIS — Z1331 Encounter for screening for depression: Secondary | ICD-10-CM | POA: Diagnosis not present

## 2022-09-24 DIAGNOSIS — E559 Vitamin D deficiency, unspecified: Secondary | ICD-10-CM | POA: Diagnosis not present

## 2022-09-24 DIAGNOSIS — Z0001 Encounter for general adult medical examination with abnormal findings: Secondary | ICD-10-CM | POA: Diagnosis not present

## 2022-09-24 DIAGNOSIS — G894 Chronic pain syndrome: Secondary | ICD-10-CM | POA: Diagnosis not present

## 2022-09-24 DIAGNOSIS — I10 Essential (primary) hypertension: Secondary | ICD-10-CM | POA: Diagnosis not present

## 2022-09-24 DIAGNOSIS — D518 Other vitamin B12 deficiency anemias: Secondary | ICD-10-CM | POA: Diagnosis not present

## 2022-09-24 DIAGNOSIS — I252 Old myocardial infarction: Secondary | ICD-10-CM | POA: Diagnosis not present

## 2022-09-24 DIAGNOSIS — L309 Dermatitis, unspecified: Secondary | ICD-10-CM | POA: Diagnosis not present

## 2022-09-24 DIAGNOSIS — E782 Mixed hyperlipidemia: Secondary | ICD-10-CM | POA: Diagnosis not present

## 2022-10-06 DIAGNOSIS — D518 Other vitamin B12 deficiency anemias: Secondary | ICD-10-CM | POA: Diagnosis not present

## 2022-10-08 ENCOUNTER — Encounter: Payer: Self-pay | Admitting: Internal Medicine

## 2022-10-08 ENCOUNTER — Ambulatory Visit: Payer: Medicare PPO | Attending: Internal Medicine | Admitting: Internal Medicine

## 2022-10-08 VITALS — BP 132/78 | HR 92 | Ht 65.0 in | Wt 163.4 lb

## 2022-10-08 DIAGNOSIS — I2510 Atherosclerotic heart disease of native coronary artery without angina pectoris: Secondary | ICD-10-CM | POA: Diagnosis not present

## 2022-10-08 DIAGNOSIS — I10 Essential (primary) hypertension: Secondary | ICD-10-CM

## 2022-10-08 DIAGNOSIS — G4733 Obstructive sleep apnea (adult) (pediatric): Secondary | ICD-10-CM

## 2022-10-08 DIAGNOSIS — I471 Supraventricular tachycardia, unspecified: Secondary | ICD-10-CM | POA: Insufficient documentation

## 2022-10-08 NOTE — Progress Notes (Signed)
Cardiology Office Note  Date: 10/08/2022   ID: Jane Wilkerson, DOB 11-Jan-1941, MRN [id number]  PCP:  Elfredia Nevins, MD  Cardiologist:  Chrystie Nose, MD Electrophysiologist:  None   Reason for Office Visit: Follow-up of SVT/nonobstructive CAD   History of Present Illness: Jane Wilkerson is a 82 y.o. female known to have nonobstructive CAD manifested by NSTEMI in the setting of SVT in 12/2021 with normal LVEF, SVT in 6/23, HTN, hypothyroidism, thoracic aneurysm is here for follow-up visit.  No interval ER visits or hospitalizations for chest pain. No palpitations, angina, DOE, dizziness, lightheadedness, syncope and leg swelling. Has extreme fatigue for the last few months. Her PCP started her on vitamin B12 injections due to severe fatigue. I am not sure if her thyroid function is regulated. Her STOP-BANG score is 4 and I think she will benefit from sleep study due to history of SVT.  Past Medical History:  Diagnosis Date   Allergy    Anxiety 07/07/2011   takes Ativan every 4hrs   Arthritis    back    Asthma    uses inhaler prn   Breast cancer    right - local excision, chemotheraphy   Cataract    right eye   Chronic back pain    Depression    has appt to discuss depression 07/26/2011   Diverticulosis    Dizziness    occasionally   Gastric ulcer    GERD (gastroesophageal reflux disease)    takes Nexium daily   Hemorrhoids    History of nuclear stress test 11/2010   lexiscan; no evidence of inducible ischemia; normal pattern of perfusion    History of shingles 2011   Hx of colonic polyps    Hyperlipidemia    takes Zocor daily   Hypertension    takes Bystolic and Losartan daily   Hypothyroidism    takes Synthroid daily   Nausea alone    onset last Wednesday  08/04/11   Nocturia    Osteoporosis    PONV (postoperative nausea and vomiting)    PVC's (premature ventricular contractions)    Shortness of breath    with exertion   Skin rash    Urinary frequency     Urticaria of unknown origin     Past Surgical History:  Procedure Laterality Date   ABDOMINAL HYSTERECTOMY  2001   BREAST LUMPECTOMY Right 07/02/11   right breast and SNL   BREAST SURGERY Left 1972   left lumpectomy   BREAST SURGERY Right 08/11/11   margins on rt breast   CATARACT EXTRACTION W/PHACO Right 08/31/2016   Procedure: CATARACT EXTRACTION PHACO AND INTRAOCULAR LENS PLACEMENT (IOC);  Surgeon: Jethro Bolus, MD;  Location: [facility];  Service: Ophthalmology;  Laterality: Right;  CDE: 8.46   CATARACT EXTRACTION W/PHACO Left 09/14/2016   Procedure: CATARACT EXTRACTION PHACO AND INTRAOCULAR LENS PLACEMENT (IOC);  Surgeon: Jethro Bolus, MD;  Location: [facility];  Service: Ophthalmology;  Laterality: Left;  CDE: 9.32   COLONOSCOPY     colonscopy     HEMORRHOID SURGERY     LEFT HEART CATH AND CORONARY ANGIOGRAPHY N/A 12/17/2021   Procedure: LEFT HEART CATH AND CORONARY ANGIOGRAPHY;  Surgeon: Lennette Bihari, MD;  Location: [facility];  Service: Cardiovascular;  Laterality: N/A;   TRANSTHORACIC ECHOCARDIOGRAM  2009   normal LV & RV systolic function; mild mitral annular calcif & mild MR; trace TR    Current Outpatient Medications  Medication Sig Dispense Refill  albuterol (VENTOLIN HFA) 108 (90 Base) MCG/ACT inhaler Inhale 1-2 puffs into the lungs every 4 (four) hours as needed for wheezing or shortness of breath. 1 each 0   amLODipine (NORVASC) 5 MG tablet TAKE 1 TABLET(5 MG) BY MOUTH DAILY (Patient taking differently: Take 5 mg by mouth daily.) 90 tablet 3   carvedilol (COREG) 12.5 MG tablet Take 1 tablet (12.5 mg total) by mouth 2 (two) times daily. 180 tablet 3   citalopram (CELEXA) 20 MG tablet Take 20 mg by mouth daily.     hydrocortisone cream 0.5 % Apply 1 application topically 2 (two) times daily as needed for itching. For hemorrhoid pain     levothyroxine (SYNTHROID, LEVOTHROID) 50 MCG tablet Take 50 mcg by mouth daily.     LORazepam (ATIVAN) 1 MG tablet Take 1 mg by mouth at  bedtime. For anxiety may take extra tablet if needed     nitroGLYCERIN (NITROSTAT) 0.4 MG SL tablet Place 1 tablet (0.4 mg total) under the tongue every 5 (five) minutes as needed for chest pain. 100 tablet 0   omeprazole (PRILOSEC) 40 MG capsule TAKE 1 CAPSULE BY MOUTH SHORTLY BEFORE BREAKFAST DAILY. (Patient taking differently: Take 40 mg by mouth daily. TAKE 1 CAPSULE BY MOUTH SHORTLY BEFORE BREAKFAST DAILY.) 90 capsule 0   psyllium (METAMUCIL) 58.6 % powder Take 1 packet by mouth daily as needed (constipation).     rosuvastatin (CRESTOR) 40 MG tablet TAKE 1 TABLET(40 MG) BY MOUTH DAILY 90 tablet 3   Spacer/Aero-Holding Chambers (AEROCHAMBER PLUS) inhaler Use with inhaler 1 each 2   triamcinolone (KENALOG) 0.1 % Apply 1 application topically daily.     Current Facility-Administered Medications  Medication Dose Route Frequency Provider Last Rate Last Admin   0.9 %  sodium chloride infusion  500 mL Intravenous Once Rachael Fee, MD       Allergies:  Bisoprolol-hydrochlorothiazide, Hydrocodone, Lipitor [atorvastatin], Lisinopril, Oxycodone, Oxycodone hcl, Ziac [bisoprolol-hydrochlorothiazide], and Penicillins   Social History: The patient  reports that she has never smoked. She has never used smokeless tobacco. She reports that she does not drink alcohol and does not use drugs.   Family History: The patient's family history includes Colon cancer in her mother; Diabetes in her sister; Hypertension in her sister; Pancreatic cancer in her father.   ROS:  Please see the history of present illness. Otherwise, complete review of systems is positive for none.  All other systems are reviewed and negative.   Physical Exam: VS:  BP 132/78   Pulse 92   Ht 5\' 5"  (1.651 m)   Wt 163 lb 6.4 oz (74.1 kg)   SpO2 94%   BMI 27.19 kg/m , BMI Body mass index is 27.19 kg/m.  Wt Readings from Last 3 Encounters:  10/08/22 163 lb 6.4 oz (74.1 kg)  04/05/22 162 lb 12.8 oz (73.8 kg)  01/08/22 157 lb 14.4  oz (71.6 kg)    General: Patient appears comfortable at rest. HEENT: Conjunctiva and lids normal, oropharynx clear with moist mucosa. Neck: Supple, no elevated JVP or carotid bruits, no thyromegaly. Lungs: Clear to auscultation, nonlabored breathing at rest. Cardiac: Regular rate and rhythm, no S3 or significant systolic murmur, no pericardial rub. Abdomen: Soft, nontender, no hepatomegaly, bowel sounds present, no guarding or rebound. Extremities: No pitting edema, distal pulses 2+. Skin: Warm and dry. Musculoskeletal: No kyphosis. Neuropsychiatric: Alert and oriented x3, affect grossly appropriate.  ECG: NSR  Recent Labwork: 12/15/2021: Magnesium 2.1 12/17/2021: Hemoglobin 11.8; Platelets 268 02/15/2022: ALT  20; AST 20; BUN 17; Creatinine, Ser 1.16; Potassium 4.3; Sodium 140     Component Value Date/Time   CHOL 130 02/15/2022 1025   CHOL 169 05/11/2013 1021   TRIG 167 (H) 02/15/2022 1025   TRIG 231 (H) 05/11/2013 1021   HDL 61 02/15/2022 1025   HDL 47 05/11/2013 1021   CHOLHDL 2.1 02/15/2022 1025   VLDL 33 02/15/2022 1025   LDLCALC 36 02/15/2022 1025   LDLCALC 76 05/11/2013 1021    Other Studies Reviewed Today: LHC in 12/2021   Ost LAD to Prox LAD lesion is 20% stenosed.   Non-stenotic Mid LAD to Dist LAD lesion. No significant coronary obstructive disease with smooth 20% ostial narrowing of the LAD and evidence for a mid LAD intramyocardial segment without muscle bridging.  Echo in 12/2021 Normal LVEF No valve abnormalities  Assessment and Plan: Patient is 82 year old F known to have nonobstructive CAD manifested by NSTEMI in 12/2021 in the setting of SVT with normal LVEF, history of SVT in 6/23, HTN, HLD, hypothyroidism presented to cardiology clinic for follow-up visit.  # Nonobstructive CAD, 20% LAD stenosis: Discontinue aspirin # HLD: Continue rosuvastatin 40 mg nightly, HLD per PCP # History of SVT: No palpitations on carvedilol 12.5 mg twice daily # OSA screening  and severe fatigue: STOP-BANG score is 4 patient has history of SVT. She will benefit from home sleep study. Instructed patient to check with her PCP about thyroid function.  I have spent a total of 30 minutes with patient reviewing chart, EKGs, labs and examining patient as well as establishing an assessment and plan that was discussed with the patient.  > 50% of time was spent in direct patient care.     Medication Adjustments/Labs and Tests Ordered: Current medicines are reviewed at length with the patient today.  Concerns regarding medicines are outlined above.   Tests Ordered: Orders Placed This Encounter  Procedures   EKG 12-Lead   [place] Sleep Study    Medication Changes: No orders of the defined types were placed in this encounter.   Disposition:  Follow up  one year  Signed, Michell Kader Verne SpurrPriya Layia Walla, MD, 10/08/2022 3:00 PM    [facility] [address], [place], [place] [id number]

## 2022-10-08 NOTE — Patient Instructions (Signed)
Medication Instructions:  STOP Aspirin   Labwork: None today  Testing/Procedures: Home sleep study  Follow-Up: 1 year  Any Other Special Instructions Will Be Listed Below (If Applicable).  If you need a refill on your cardiac medications before your next appointment, please call your pharmacy.

## 2022-10-11 ENCOUNTER — Telehealth: Payer: Self-pay | Admitting: *Deleted

## 2022-10-11 NOTE — Telephone Encounter (Signed)
Prior Authorization for Automatic Data sent to [facility] via web portal. Tracking Number [id number]

## 2022-10-12 NOTE — Telephone Encounter (Signed)
Husband notified of code and that they could now use the monitor.

## 2022-10-15 ENCOUNTER — Encounter (INDEPENDENT_AMBULATORY_CARE_PROVIDER_SITE_OTHER): Payer: Medicare PPO | Admitting: Cardiology

## 2022-10-15 DIAGNOSIS — G4733 Obstructive sleep apnea (adult) (pediatric): Secondary | ICD-10-CM

## 2022-10-17 ENCOUNTER — Ambulatory Visit: Payer: Medicare PPO | Attending: Internal Medicine

## 2022-10-17 ENCOUNTER — Encounter: Payer: Self-pay | Admitting: Cardiology

## 2022-10-17 DIAGNOSIS — G4733 Obstructive sleep apnea (adult) (pediatric): Secondary | ICD-10-CM

## 2022-10-17 NOTE — Progress Notes (Signed)
This encounter was created in error - please disregard.

## 2022-10-17 NOTE — Procedures (Signed)
Erroneous encounter

## 2022-10-17 NOTE — Procedures (Signed)
SLEEP STUDY REPORT Patient Information Study Date: 10/15/2022 Patient Name: Jane Wilkerson Patient ID: [id number] Birth Date: [birth year] Age: 82 Gender: Female BMI: 27.2 (W=163 lb, H=5' 5'') Stopbang: 4 Referring Physician: Luane School, MD  TEST DESCRIPTION: Home sleep apnea testing was completed using the WatchPat, a Type 1 device, utilizing peripheral arterial tonometry (PAT), chest movement, actigraphy, pulse oximetry, pulse rate, body position and snore.  AHI was calculated with apnea and hypopnea using valid sleep time as the denominator. RDI includes apneas, hypopneas, and RERAs.  The data acquired and the scoring of sleep and all associated events were performed in accordance with the recommended standards and specifications as outlined in the AASM Manual for the Scoring of Sleep and Associated Events 2.2.0 (2015).  FINDINGS:  1.  Severe Obstructive Sleep Apnea with AHI 40.5/hr.   2.  No Central Sleep Apnea with pAHIc 1.4/hr.  3.  Oxygen desaturations as low as 67%.  4.  Severe snoring was present. O2 sats were < 88% for 66.7 min.  5.  Total sleep time was 8 hrs and 50 min.  6.  23.5% of total sleep time was spent in REM sleep.   7.  Prolonged sleep onset latency at 32 min.   8.  Prolonged REM sleep onset latency at 260 min.   9.  Total awakenings were 16.  10. Arrhythmia detection:  None.  DIAGNOSIS:   Severe Obstructive Sleep Apnea (G47.33) Nocturnal Hypoxemia  RECOMMENDATIONS:   1.  Clinical correlation of these findings is necessary.  The decision to treat obstructive sleep apnea (OSA) is usually based on the presence of apnea symptoms or the presence of associated medical conditions such as Hypertension, Congestive Heart Failure, Atrial Fibrillation or Obesity.  The most common symptoms of OSA are snoring, gasping for breath while sleeping, daytime sleepiness and fatigue.   2.  Initiating apnea therapy is recommended given the presence of symptoms and/or  associated conditions. Recommend proceeding with one of the following:     a.  Auto-CPAP therapy with a pressure range of 5-20cm H2O.     b.  An oral appliance (OA) that can be obtained from certain dentists with expertise in sleep medicine.  These are primarily of use in non-obese patients with mild and moderate disease.     c.  An ENT consultation which may be useful to look for specific causes of obstruction and possible treatment options.     d.  If patient is intolerant to PAP therapy, consider referral to ENT for evaluation for hypoglossal nerve stimulator.   3.  Close follow-up is necessary to ensure success with CPAP or oral appliance therapy for maximum benefit.  4.  A follow-up oximetry study on CPAP is recommended to assess the adequacy of therapy and determine the need for supplemental oxygen or the potential need for Bi-level therapy.  An arterial blood gas to determine the adequacy of baseline ventilation and oxygenation should also be considered.  5.  Healthy sleep recommendations include:  adequate nightly sleep (normal 7-9 hrs/night), avoidance of caffeine after noon and alcohol near bedtime, and maintaining a sleep environment that is cool, dark and quiet.  6.  Weight loss for overweight patients is recommended.  Even modest amounts of weight loss can significantly improve the severity of sleep apnea.  7.  Snoring recommendations include:  weight loss where appropriate, side sleeping, and avoidance of alcohol before bed.  8.  Operation of motor vehicle should be avoided when sleepy.  Signature: Armanda Magic,  MD; Ruthann Cancer; Diplomat, American Board of Sleep Medicine Electronically Signed: 10/17/2022 5:55:01 PM

## 2022-10-19 DIAGNOSIS — H524 Presbyopia: Secondary | ICD-10-CM | POA: Diagnosis not present

## 2022-10-19 DIAGNOSIS — Z961 Presence of intraocular lens: Secondary | ICD-10-CM | POA: Diagnosis not present

## 2022-10-19 DIAGNOSIS — H5213 Myopia, bilateral: Secondary | ICD-10-CM | POA: Diagnosis not present

## 2022-10-19 DIAGNOSIS — H52203 Unspecified astigmatism, bilateral: Secondary | ICD-10-CM | POA: Diagnosis not present

## 2022-10-28 ENCOUNTER — Other Ambulatory Visit: Payer: Self-pay | Admitting: Internal Medicine

## 2022-11-05 DIAGNOSIS — T452X1D Poisoning by vitamins, accidental (unintentional), subsequent encounter: Secondary | ICD-10-CM | POA: Diagnosis not present

## 2022-11-05 DIAGNOSIS — D518 Other vitamin B12 deficiency anemias: Secondary | ICD-10-CM | POA: Diagnosis not present

## 2022-11-05 DIAGNOSIS — Z6829 Body mass index (BMI) 29.0-29.9, adult: Secondary | ICD-10-CM | POA: Diagnosis not present

## 2022-11-05 DIAGNOSIS — I129 Hypertensive chronic kidney disease with stage 1 through stage 4 chronic kidney disease, or unspecified chronic kidney disease: Secondary | ICD-10-CM | POA: Diagnosis not present

## 2022-11-05 DIAGNOSIS — I2510 Atherosclerotic heart disease of native coronary artery without angina pectoris: Secondary | ICD-10-CM | POA: Diagnosis not present

## 2022-11-05 DIAGNOSIS — N1832 Chronic kidney disease, stage 3b: Secondary | ICD-10-CM | POA: Diagnosis not present

## 2022-11-12 ENCOUNTER — Telehealth: Payer: Self-pay | Admitting: Internal Medicine

## 2022-11-12 DIAGNOSIS — Z6829 Body mass index (BMI) 29.0-29.9, adult: Secondary | ICD-10-CM | POA: Diagnosis not present

## 2022-11-12 DIAGNOSIS — N1832 Chronic kidney disease, stage 3b: Secondary | ICD-10-CM | POA: Diagnosis not present

## 2022-11-12 DIAGNOSIS — I2510 Atherosclerotic heart disease of native coronary artery without angina pectoris: Secondary | ICD-10-CM | POA: Diagnosis not present

## 2022-11-12 DIAGNOSIS — I129 Hypertensive chronic kidney disease with stage 1 through stage 4 chronic kidney disease, or unspecified chronic kidney disease: Secondary | ICD-10-CM | POA: Diagnosis not present

## 2022-11-12 NOTE — Telephone Encounter (Signed)
Message sent to provider- she will call provider

## 2022-11-12 NOTE — Telephone Encounter (Signed)
Dr. Wolfgang Phoenix is calling requesting to speak with Dr. Jenene Slicker regarding this patient. Callback number listed is Dr. Lucio Edward cell. Please advise.

## 2022-11-24 ENCOUNTER — Encounter: Payer: Self-pay | Admitting: Primary Care

## 2022-11-24 ENCOUNTER — Ambulatory Visit (INDEPENDENT_AMBULATORY_CARE_PROVIDER_SITE_OTHER): Payer: Medicare PPO | Admitting: Primary Care

## 2022-11-24 VITALS — BP 122/72 | HR 84 | Temp 98.3°F | Ht 62.0 in | Wt 162.0 lb

## 2022-11-24 DIAGNOSIS — G4730 Sleep apnea, unspecified: Secondary | ICD-10-CM | POA: Diagnosis not present

## 2022-11-24 DIAGNOSIS — G4733 Obstructive sleep apnea (adult) (pediatric): Secondary | ICD-10-CM

## 2022-11-24 NOTE — Patient Instructions (Addendum)
Your home sleep study in April showed severe obstructive sleep apnea, had on average 40 apneic events an hour.  Oxygen level went as low as 67%.  You spent 66 minutes with a low oxygen level.  Due to severity of your sleep apnea I am recommending we trial you on CPAP   Mild OSA 5-15 apneic events an hour Moderate OSA 15-30 apneic events an hour Severe OSA > 30 apneic events an hour   Untreated sleep apnea puts you at higher risk for cardiac arrhythmias, pulmonary HTN, stroke and diabetes  Treatment options include weight loss, side sleeping position, oral appliance, CPAP therapy or referral to ENT for possible surgical options    Recommendations: Start CPAP, aim to wear nightly 4-6 hours or longer  Focus on side sleeping position or elevate head with wedge pillow 30 degrees Do not drive if experiencing excessive daytime sleepiness of fatigue    Orders: Auto CPAP 5-15cm h20 with mask of choice    Follow-up: Please schedule a follow-up in 6-8 weeks with Beth NP in office   CPAP and BIPAP Information CPAP and BIPAP are methods that use air pressure to keep your airways open and to help you breathe well. CPAP and BIPAP use different amounts of pressure. Your health care provider will tell you whether CPAP or BIPAP would be more helpful for you. CPAP stands for "continuous positive airway pressure." With CPAP, the amount of pressure stays the same while you breathe in (inhale) and out (exhale). BIPAP stands for "bi-level positive airway pressure." With BIPAP, the amount of pressure will be higher when you inhale and lower when you exhale. This allows you to take larger breaths. CPAP or BIPAP may be used in the hospital, or your health care provider may want you to use it at home. You may need to have a sleep study before your health care provider can order a machine for you to use at home. What are the advantages? CPAP or BIPAP can be helpful if you have: Sleep apnea. Chronic obstructive  pulmonary disease (COPD). Heart failure. Medical conditions that cause muscle weakness, including muscular dystrophy or amyotrophic lateral sclerosis (ALS). Other problems that cause breathing to be shallow, weak, abnormal, or difficult. CPAP and BIPAP are most commonly used for obstructive sleep apnea (OSA) to keep the airways from collapsing when the muscles relax during sleep. What are the risks? Generally, this is a safe treatment. However, problems may occur, including: Irritated skin or skin sores if the mask does not fit properly. Dry or stuffy nose or nosebleeds. Dry mouth. Feeling gassy or bloated. Sinus or lung infection if the equipment is not cleaned properly. When should CPAP or BIPAP be used? In most cases, the mask only needs to be worn during sleep. Generally, the mask needs to be worn throughout the night and during any daytime naps. People with certain medical conditions may also need to wear the mask at other times, such as when they are awake. Follow instructions from your health care provider about when to use the machine. What happens during CPAP or BIPAP?  Both CPAP and BIPAP are provided by a small machine with a flexible plastic tube that attaches to a plastic mask that you wear. Air is blown through the mask into your nose or mouth. The amount of pressure that is used to blow the air can be adjusted on the machine. Your health care provider will set the pressure setting and help you find the best mask for  you. Tips for using the mask Because the mask needs to be snug, some people feel trapped or closed-in (claustrophobic) when first using the mask. If you feel this way, you may need to get used to the mask. One way to do this is to hold the mask loosely over your nose or mouth and then gradually apply the mask more snugly. You can also gradually increase the amount of time that you use the mask. Masks are available in various types and sizes. If your mask does not fit  well, talk with your health care provider about getting a different one. Some common types of masks include: Full face masks, which fit over the mouth and nose. Nasal masks, which fit over the nose. Nasal pillow or prong masks, which fit into the nostrils. If you are using a mask that fits over your nose and you tend to breathe through your mouth, a chin strap may be applied to help keep your mouth closed. Use a skin barrier to protect your skin as told by your health care provider. Some CPAP and BIPAP machines have alarms that may sound if the mask comes off or develops a leak. If you have trouble with the mask, it is very important that you talk with your health care provider about finding a way to make the mask easier to tolerate. Do not stop using the mask. There could be a negative impact on your health if you stop using the mask. Tips for using the machine Place your CPAP or BIPAP machine on a secure table or stand near an electrical outlet. Know where the on/off switch is on the machine. Follow instructions from your health care provider about how to set the pressure on your machine and when you should use it. Do not eat or drink while the CPAP or BIPAP machine is on. Food or fluids could get pushed into your lungs by the pressure of the CPAP or BIPAP. For home use, CPAP and BIPAP machines can be rented or purchased through home health care companies. Many different brands of machines are available. Renting a machine before purchasing may help you find out which particular machine works well for you. Your health insurance company may also decide which machine you may get. Keep the CPAP or BIPAP machine and attachments clean. Ask your health care provider for specific instructions. Check the humidifier if you have a dry stuffy nose or nosebleeds. Make sure it is working correctly. Follow these instructions at home: Take over-the-counter and prescription medicines only as told by your health  care provider. Ask if you can take sinus medicine if your sinuses are blocked. Do not use any products that contain nicotine or tobacco. These products include cigarettes, chewing tobacco, and vaping devices, such as e-cigarettes. If you need help quitting, ask your health care provider. Keep all follow-up visits. This is important. Contact a health care provider if: You have redness or pressure sores on your head, face, mouth, or nose from the mask or head gear. You have trouble using the CPAP or BIPAP machine. You cannot tolerate wearing the CPAP or BIPAP mask. Someone tells you that you snore even when wearing your CPAP or BIPAP. Get help right away if: You have trouble breathing. You feel confused. Summary CPAP and BIPAP are methods that use air pressure to keep your airways open and to help you breathe well. If you have trouble with the mask, it is very important that you talk with your health care  provider about finding a way to make the mask easier to tolerate. Do not stop using the mask. There could be a negative impact to your health if you stop using the mask. Follow instructions from your health care provider about when to use the machine. This information is not intended to replace advice given to you by your health care provider. Make sure you discuss any questions you have with your health care provider. Document Revised: 01/28/2021 Document Reviewed: 05/30/2020 Elsevier Patient Education  2023 [employer].

## 2022-11-24 NOTE — Assessment & Plan Note (Addendum)
-   Patient has symptoms of snoring and daytime sleepiness. She has a significant cardiac history. She had a home sleep study in April 2024 that showed severe obstructive sleep apnea, average AHI was 40.5/hour with SpO2 low 67%. She spent 66 minutes with O2 <88%.  Due to severity of her OSA recommending patient be started on auto CPAP.  She is somewhat hesitant due to being claustrophobic however she is agreeing to try.  Alternative treatment options include oral appliance, referral to ENT for possible surgical options or inspire consideration.  DME order placed for patient to be started on auto CPAP 5 to 15 cm H2O with mask of choice.  Encourage side sleeping position or elevate head of bed 30 degrees with wedge pillow.  Advised against driving if experiencing excessive daytime sleepiness or fatigue.  Follow-up 31 to 90 days after starting CPAP for compliance check or sooner if needed.  Would get a night oximetry test on CPAP once established.

## 2022-11-24 NOTE — Progress Notes (Signed)
@Patient  ID: Jane Wilkerson, female    DOB: 1940-07-19, 82 y.o.   MRN: [id number]  Chief Complaint  Patient presents with   Consult    HST 10/15/2022 ordered by cardiology.  Did not want to try a CPAP.  Referred for alternative treatment for OSA.  Snoring and nighttime apneas.    Referring provider: Randa Lynn, MD  HPI: 82 year old female, never smoked.  Past medical history significant for OSA, hypertension, paroxysmal SVT, NSTEMI, coronary artery disease, hypothyroidism.   11/24/2022 Patient presents today for sleep consult. Accompanied by her husband. She has a significant cardiac history including paroxysmal SVT, NSTEMI and HTN. She has symptoms of snoring and daytime sleepiness. She is very tired throughout the day and requires a nap most afternoons. She had a home sleep test with [facility] on 10/15/22 showed severe OSA, AHI 40.5 an hour with SpO2 low 67%.  She spent 66 minutes with O2 sat less than 88%.  Severe snoring was noted.  We discussed sleep study results today and treatment options.  Due to the severity of her sleep apnea recommending she be tried on CPAP.  Patient is somewhat hesitant due to claustrophobia but agreeing to plan.   Sleep questionnaire Symptoms-  Daytime fatigue; Sleep apnea  Prior sleep study- April 2024>> severe OSA>> AHI 40/hour  Bedtime- 11pm  Time to fall asleep- 1 hour Nocturnal awakenings- three  Out of bed/start of day- 11am Weight changes- 20 lbs Do you operate heavy machinery- no Do you currently wear CPAP- no Do you current wear oxygen- no Epworth- 5  Social hx: Patient is married.  She has children.  She lives with her spouse.  She is retired.  No alcohol or tobacco use.   Allergies  Allergen Reactions   Bisoprolol-Hydrochlorothiazide Other (See Comments)    Reaction unknown Reaction unknown   Hydrocodone Other (See Comments)    Reaction unknown Reaction unknown Reaction unknown   Lipitor [Atorvastatin] Itching   Lisinopril  Itching   Oxycodone Itching   Oxycodone Hcl Itching   Ziac [Bisoprolol-Hydrochlorothiazide] Other (See Comments)    Reaction unknown   Penicillins Rash    Has patient had a PCN reaction causing immediate rash, facial/tongue/throat swelling, SOB or lightheadedness with hypotension: unknown Has patient had a PCN reaction causing severe rash involving mucus membranes or skin necrosis: unknown Has patient had a PCN reaction that required hospitalization No Has patient had a PCN reaction occurring within the last 10 years: No If all of the above answers are "NO", then may proceed with Cephalosporin use.     Immunization History  Administered Date(s) Administered   Pneumococcal Conjugate-13 07/08/2014   Pneumococcal Polysaccharide-23 07/07/2009   Tdap 11/19/2017    Past Medical History:  Diagnosis Date   Allergy    Anxiety 07/07/2011   takes Ativan every 4hrs   Arthritis    back    Asthma    uses inhaler prn   Breast cancer (HCC)    right - local excision, chemotheraphy   Cataract    right eye   Chronic back pain    Depression    has appt to discuss depression 07/26/2011   Diverticulosis    Dizziness    occasionally   Gastric ulcer    GERD (gastroesophageal reflux disease)    takes Nexium daily   Hemorrhoids    History of nuclear stress test 11/2010   lexiscan; no evidence of inducible ischemia; normal pattern of perfusion    History of shingles 2011  Hx of colonic polyps    Hyperlipidemia    takes Zocor daily   Hypertension    takes Bystolic and Losartan daily   Hypothyroidism    takes Synthroid daily   Nausea alone    onset last Wednesday  08/04/11   Nocturia    Osteoporosis    PONV (postoperative nausea and vomiting)    PVC's (premature ventricular contractions)    Shortness of breath    with exertion   Skin rash    Urinary frequency    Urticaria of unknown origin     Tobacco History: Social History   Tobacco Use  Smoking Status Never   Passive  exposure: Past  Smokeless Tobacco Never   Counseling given: Not Answered   Outpatient Medications Prior to Visit  Medication Sig Dispense Refill   amLODipine (NORVASC) 5 MG tablet TAKE 1 TABLET(5 MG) BY MOUTH DAILY 90 tablet 3   carvedilol (COREG) 12.5 MG tablet Take 1 tablet (12.5 mg total) by mouth 2 (two) times daily. 180 tablet 3   citalopram (CELEXA) 20 MG tablet Take 20 mg by mouth daily.     hydrocortisone cream 0.5 % Apply 1 application topically 2 (two) times daily as needed for itching. For hemorrhoid pain     levothyroxine (SYNTHROID, LEVOTHROID) 50 MCG tablet Take 50 mcg by mouth daily.     LORazepam (ATIVAN) 1 MG tablet Take 1 mg by mouth at bedtime. For anxiety may take extra tablet if needed     nitroGLYCERIN (NITROSTAT) 0.4 MG SL tablet Place 1 tablet (0.4 mg total) under the tongue every 5 (five) minutes as needed for chest pain. 100 tablet 0   omeprazole (PRILOSEC) 40 MG capsule TAKE 1 CAPSULE BY MOUTH SHORTLY BEFORE BREAKFAST DAILY. (Patient taking differently: Take 40 mg by mouth daily. TAKE 1 CAPSULE BY MOUTH SHORTLY BEFORE BREAKFAST DAILY.) 90 capsule 0   psyllium (METAMUCIL) 58.6 % powder Take 1 packet by mouth daily as needed (constipation).     rosuvastatin (CRESTOR) 40 MG tablet TAKE 1 TABLET(40 MG) BY MOUTH DAILY 90 tablet 3   triamcinolone (KENALOG) 0.1 % Apply 1 application topically daily.     albuterol (VENTOLIN HFA) 108 (90 Base) MCG/ACT inhaler Inhale 1-2 puffs into the lungs every 4 (four) hours as needed for wheezing or shortness of breath. (Patient not taking: Reported on 11/24/2022) 1 each 0   Spacer/Aero-Holding Chambers (AEROCHAMBER PLUS) inhaler Use with inhaler (Patient not taking: Reported on 11/24/2022) 1 each 2   Facility-Administered Medications Prior to Visit  Medication Dose Route Frequency Provider Last Rate Last Admin   0.9 %  sodium chloride infusion  500 mL Intravenous Once Rachael Fee, MD        Review of Systems  Review of Systems   Constitutional:  Positive for fatigue.  Respiratory: Negative.      Physical Exam  BP 122/72 (BP Location: Right Arm, Patient Position: Sitting, Cuff Size: Normal)   Pulse 84   Temp 98.3 F (36.8 C) (Oral)   Ht 5\' 2"  (1.575 m)   Wt 162 lb (73.5 kg)   SpO2 94%   BMI 29.63 kg/m  Physical Exam Constitutional:      General: She is not in acute distress.    Appearance: Normal appearance. She is not ill-appearing.  HENT:     Head: Normocephalic and atraumatic.  Cardiovascular:     Rate and Rhythm: Normal rate and regular rhythm.     Comments: RRR Pulmonary:  Effort: Pulmonary effort is normal.     Breath sounds: Normal breath sounds.  Musculoskeletal:        General: Normal range of motion.  Skin:    General: Skin is warm and dry.  Neurological:     General: No focal deficit present.     Mental Status: She is alert and oriented to person, place, and time. Mental status is at baseline.  Psychiatric:        Mood and Affect: Mood normal.        Behavior: Behavior normal.        Thought Content: Thought content normal.        Judgment: Judgment normal.      Lab Results:  CBC    Component Value Date/Time   WBC 8.4 12/17/2021 0240   RBC 4.13 12/17/2021 0240   HGB 11.8 (L) 12/17/2021 0240   HGB 11.8 11/07/2014 1020   HCT 36.1 12/17/2021 0240   HCT 35.8 11/07/2014 1020   PLT 268 12/17/2021 0240   PLT 286 11/07/2014 1020   MCV 87.4 12/17/2021 0240   MCV 88.0 11/07/2014 1020   MCH 28.6 12/17/2021 0240   MCHC 32.7 12/17/2021 0240   RDW 13.9 12/17/2021 0240   RDW 14.1 11/07/2014 1020   LYMPHSABS 1.2 08/30/2016 0814   LYMPHSABS 1.0 11/07/2014 1020   MONOABS 1.0 08/30/2016 0814   MONOABS 0.9 11/07/2014 1020   EOSABS 0.3 08/30/2016 0814   EOSABS 0.2 11/07/2014 1020   BASOSABS 0.1 08/30/2016 0814   BASOSABS 0.1 11/07/2014 1020    BMET    Component Value Date/Time   NA 140 02/15/2022 1024   NA 139 11/07/2014 1021   K 4.3 02/15/2022 1024   K 4.3 11/07/2014  1021   CL 104 02/15/2022 1024   CL 100 08/04/2012 0921   CO2 29 02/15/2022 1024   CO2 26 11/07/2014 1021   GLUCOSE 127 (H) 02/15/2022 1024   GLUCOSE 110 11/07/2014 1021   GLUCOSE 109 (H) 08/04/2012 0921   BUN 17 02/15/2022 1024   BUN 12.9 11/07/2014 1021   CREATININE 1.16 (H) 02/15/2022 1024   CREATININE 1.21 (H) 10/04/2016 1022   CREATININE 1.2 (H) 11/07/2014 1021   CALCIUM 8.7 (L) 02/15/2022 1024   CALCIUM 9.1 11/07/2014 1021   GFRNONAA 48 (L) 02/15/2022 1024   GFRAA 49 (L) 08/30/2016 0814    BNP No results found for: "BNP"  ProBNP No results found for: "PROBNP"  Imaging: No results found.   Assessment & Plan:   OSA (obstructive sleep apnea) - Patient has symptoms of snoring and daytime sleepiness. She has a significant cardiac history. She had a home sleep study in April 2024 that showed severe obstructive sleep apnea, average AHI was 40.5/hour with SpO2 low 67%. She spent 66 minutes with O2 <88%.  Due to severity of her OSA recommending patient be started on auto CPAP.  She is somewhat hesitant due to being claustrophobic however she is agreeing to try.  Alternative treatment options include oral appliance, referral to ENT for possible surgical options or inspire consideration.  DME order placed for patient to be started on auto CPAP 5 to 15 cm H2O with mask of choice.  Encourage side sleeping position or elevate head of bed 30 degrees with wedge pillow.  Advised against driving if experiencing excessive daytime sleepiness or fatigue.  Follow-up 31 to 90 days after starting CPAP for compliance check or sooner if needed.  Would get a night oximetry test on CPAP once  established.  45 mins spent on case; > 50% face to face with patient  Glenford Bayley, NP 11/24/2022

## 2022-11-25 NOTE — Progress Notes (Signed)
Reviewed and agree with assessment/plan.   Ofilia Rayon, MD [facility] 11/25/2022, 7:06 AM Pager:  [phone]  

## 2022-12-03 ENCOUNTER — Other Ambulatory Visit: Payer: Self-pay

## 2022-12-03 DIAGNOSIS — Z853 Personal history of malignant neoplasm of breast: Secondary | ICD-10-CM

## 2022-12-06 ENCOUNTER — Inpatient Hospital Stay: Payer: Medicare PPO

## 2022-12-06 ENCOUNTER — Ambulatory Visit (HOSPITAL_COMMUNITY)
Admission: RE | Admit: 2022-12-06 | Discharge: 2022-12-06 | Disposition: A | Discharge status: Home / Self Care | Payer: Medicare PPO | Source: Ambulatory Visit | Attending: Adult Health | Admitting: Adult Health

## 2022-12-06 DIAGNOSIS — Z1231 Encounter for screening mammogram for malignant neoplasm of breast: Secondary | ICD-10-CM | POA: Diagnosis not present

## 2022-12-07 ENCOUNTER — Inpatient Hospital Stay: Payer: Medicare PPO | Attending: Adult Health

## 2022-12-07 DIAGNOSIS — Z9223 Personal history of estrogen therapy: Secondary | ICD-10-CM | POA: Insufficient documentation

## 2022-12-07 DIAGNOSIS — Z853 Personal history of malignant neoplasm of breast: Secondary | ICD-10-CM | POA: Insufficient documentation

## 2022-12-07 DIAGNOSIS — Z170 Estrogen receptor positive status [ER+]: Secondary | ICD-10-CM | POA: Insufficient documentation

## 2022-12-07 LAB — VITAMIN D 25 HYDROXY (VIT D DEFICIENCY, FRACTURES): Vit D, 25-Hydroxy: 46.46 ng/mL (ref 30–100)

## 2022-12-07 LAB — CBC WITH DIFFERENTIAL/PLATELET
Abs Immature Granulocytes: 0.03 10*3/uL (ref 0.00–0.07)
Basophils Absolute: 0.1 10*3/uL (ref 0.0–0.1)
Basophils Relative: 1 %
Eosinophils Absolute: 0.3 10*3/uL (ref 0.0–0.5)
Eosinophils Relative: 4 %
HCT: 35.9 % — ABNORMAL LOW (ref 36.0–46.0)
Hemoglobin: 11.2 g/dL — ABNORMAL LOW (ref 12.0–15.0)
Immature Granulocytes: 1 %
Lymphocytes Relative: 19 %
Lymphs Abs: 1.2 10*3/uL (ref 0.7–4.0)
MCH: 27.7 pg (ref 26.0–34.0)
MCHC: 31.2 g/dL (ref 30.0–36.0)
MCV: 88.9 fL (ref 80.0–100.0)
Monocytes Absolute: 0.9 10*3/uL (ref 0.1–1.0)
Monocytes Relative: 14 %
Neutro Abs: 3.8 10*3/uL (ref 1.7–7.7)
Neutrophils Relative %: 61 %
Platelets: 282 10*3/uL (ref 150–400)
RBC: 4.04 MIL/uL (ref 3.87–5.11)
RDW: 14.2 % (ref 11.5–15.5)
WBC: 6.2 10*3/uL (ref 4.0–10.5)
nRBC: 0 % (ref 0.0–0.2)

## 2022-12-07 LAB — COMPREHENSIVE METABOLIC PANEL
ALT: 14 U/L (ref 0–44)
AST: 17 U/L (ref 15–41)
Albumin: 3.8 g/dL (ref 3.5–5.0)
Alkaline Phosphatase: 55 U/L (ref 38–126)
Anion gap: 8 (ref 5–15)
BUN: 18 mg/dL (ref 8–23)
CO2: 29 mmol/L (ref 22–32)
Calcium: 8.7 mg/dL — ABNORMAL LOW (ref 8.9–10.3)
Chloride: 101 mmol/L (ref 98–111)
Creatinine, Ser: 1.23 mg/dL — ABNORMAL HIGH (ref 0.44–1.00)
GFR, Estimated: 44 mL/min — ABNORMAL LOW (ref >60)
Glucose, Bld: 134 mg/dL — ABNORMAL HIGH (ref 70–99)
Potassium: 4.2 mmol/L (ref 3.5–5.1)
Sodium: 138 mmol/L (ref 135–145)
Total Bilirubin: 0.4 mg/dL (ref 0.3–1.2)
Total Protein: 6.6 g/dL (ref 6.5–8.1)

## 2022-12-07 NOTE — Telephone Encounter (Signed)
The patient has been notified of the result. Left detailed message on voicemail and informed patient to call back..Delcenia Inman Green, CMA   

## 2022-12-09 NOTE — Telephone Encounter (Signed)
Return Call: The patient has been notified of the result and verbalized understanding.  All questions (if any) were answered. Latrelle Dodrill, CMA 12/09/2022 5:33 PM    Patient is being followed by Pulmonary Dr Ames Dura for her sleep therapy.

## 2022-12-10 DIAGNOSIS — G4733 Obstructive sleep apnea (adult) (pediatric): Secondary | ICD-10-CM | POA: Diagnosis not present

## 2022-12-10 NOTE — Progress Notes (Unsigned)
[facility] [address] [place], [place] [address]   CLINIC:  Medical Oncology/Hematology  PCP:  Elfredia Nevins, MD [address] / [place] [address] [phone]   REASON FOR VISIT:  Follow-up for history of right-sided breast cancer (DCIS)  PRIOR THERAPY: Lumpectomy, adjuvant XRT, antiestrogen therapy  CURRENT THERAPY: Surveillance  BRIEF ONCOLOGIC HISTORY:   Oncology History  History of right breast cancer  07/02/2011 Surgery   Right lumpectomy: DCIS ER 100%, PR 0%   07/23/2011 - 08/20/2011 Radiation Therapy   Adjuvant XRT   11/19/2011 - 11/30/2016 Anti-estrogen oral therapy   Aromasin 25 mg daily X 1 month then changed to Arimidex 1 mg daily     CANCER STAGING: Cancer Staging  History of right breast cancer Staging form: Breast, AJCC 7th Edition - Pathologic: Stage 0 (Tis (DCIS), N0, cM0) - Signed by Sabas Sous, MD on 05/06/2014   INTERVAL HISTORY:   [name], a 82 y.o. female, who was previously seen for long-term survivorship by NP Lillard Anes at [facility] in [place].  Patient requested follow-up here at [facility], since she lives in [place] and would like to stay closer to home.  She was last seen by Mardella Layman on 01/08/2022.  She returns today for routine follow-up and long-term survivorship visit due to history of right breast DCIS.  At today's visit, she  reports feeling fair.  She denies any recent hospitalizations, surgeries, or changes in her  baseline health status.  She denies any symptoms of recurrence such as new lumps, bone pain, chest pain, or abdominal pain.  She has no new headaches, seizures, or focal neurologic deficits.  No B symptoms such as fever, chills, night sweats, unintentional weight loss.  She has some chronic dyspnea on exertion which is stable at baseline, and has been told in the past that might have asthma.  She also has some Afib and was recently diagnosed with sleep  apnea.  She reports 25% energy and 60% appetite.  She is maintaining stable weight at this time.   ASSESSMENT & PLAN:  1.  History of right breast DCIS (2012) - Right breast lumpectomy (07/02/2011) revealed DCIS, ER 100%, PR 0% - She underwent adjuvant XRT from 07/23/2011 through 08/20/2011 - She completed 5 years of treatment with antiestrogen oral therapy (Aromasin/Arimidex) from 11/19/2011 through 11/30/2016. - Most recent mammogram (12/06/2022): BI-RADS Category 2, benign.  Breast density category B.  Stable postlumpectomy changes in right breast, no findings suspicious for malignancy. - Labs (12/07/2022) show CBC at baseline, normal LFTs, and CKD stage IIIa/B at baseline (creatinine 1.23/GFR 44).  Vitamin D is normal. - Physical exam shows right lumpectomy scar within normal limits.  No discrete nodules, masses, or lymphadenopathy in either breast or surrounding tissue. - No red-flag symptoms concerning for recurrent for breast cancer at this time - PLAN: Patient prefers follow-up annually with oncologist rather than discharge to PCP.  We will see her in 1 year for repeat mammogram, physical exam, and labs as appropriate.    PLAN SUMMARY: >> Mammogram in 1 year (12/07/2023) >> Labs in 1 year = CBC/D, CMP, vitamin D >> OFFICE visit in 1 year (1 week after labs/mammogram)    REVIEW OF SYSTEMS:   Review of Systems  Constitutional:  Positive for fatigue. Negative for appetite change, chills, diaphoresis, fever and unexpected weight change.  HENT:   Negative for lump/mass and nosebleeds.   Eyes:  Negative for eye problems.  Respiratory:  Positive for shortness of breath (with exertion). Negative for cough and hemoptysis.   Cardiovascular:  Negative for chest pain, leg swelling and palpitations.  Gastrointestinal:  Positive for constipation and nausea. Negative for abdominal pain, blood in stool, diarrhea and vomiting.  Genitourinary:  Negative for hematuria.   Skin: Negative.   Neurological:   Positive for headaches and numbness. Negative for dizziness and light-headedness.  Hematological:  Does not bruise/bleed easily.  Psychiatric/Behavioral:  Positive for depression and sleep disturbance. The patient is nervous/anxious.     PHYSICAL EXAM:   Performance status (ECOG): 1 - Symptomatic but completely ambulatory  There were no vitals filed for this visit. Wt Readings from Last 3 Encounters:  11/24/22 162 lb (73.5 kg)  10/08/22 163 lb 6.4 oz (74.1 kg)  04/05/22 162 lb 12.8 oz (73.8 kg)   Physical Exam Constitutional:      Appearance: Normal appearance. She is obese.  Cardiovascular:     Heart sounds: Normal heart sounds.  Pulmonary:     Breath sounds: Normal breath sounds.  Chest:     Comments: Right lumpectomy scar within normal limits.  No discrete nodules, masses, or lymphadenopathy in either breast or surrounding tissue. Neurological:     General: No focal deficit present.     Mental Status: Mental status is at baseline.  Psychiatric:        Behavior: Behavior normal. Behavior is cooperative.      PAST MEDICAL/SURGICAL HISTORY:  Past Medical History:  Diagnosis Date   Allergy    Anxiety 07/07/2011   takes Ativan every 4hrs   Arthritis    back    Asthma    uses inhaler prn   Breast cancer (HCC)    right - local excision, chemotheraphy   Cataract    right eye   Chronic back pain    Depression    has appt to discuss depression 07/26/2011   Diverticulosis    Dizziness    occasionally   Gastric ulcer    GERD (gastroesophageal reflux disease)    takes Nexium daily   Hemorrhoids    History of nuclear stress test 11/2010   lexiscan; no evidence of inducible ischemia; normal pattern of perfusion    History of shingles 2011   Hx of colonic polyps    Hyperlipidemia    takes Zocor daily   Hypertension    takes Bystolic and Losartan daily   Hypothyroidism    takes Synthroid daily   Nausea alone    onset last Wednesday  08/04/11   Nocturia    Osteoporosis     PONV (postoperative nausea and vomiting)    PVC's (premature ventricular contractions)    Shortness of breath    with exertion   Skin rash    Urinary frequency    Urticaria of unknown origin    Past Surgical History:  Procedure Laterality Date   ABDOMINAL HYSTERECTOMY  2001   BREAST LUMPECTOMY Right 07/02/11   right breast and SNL   BREAST SURGERY Left 1972   left lumpectomy   BREAST SURGERY Right 08/11/11   margins on rt breast   CATARACT EXTRACTION W/PHACO Right 08/31/2016   Procedure: CATARACT EXTRACTION PHACO AND INTRAOCULAR LENS PLACEMENT (IOC);  Surgeon: Jethro Bolus, MD;  Location: [facility];  Service: Ophthalmology;  Laterality: Right;  CDE: 8.46   CATARACT EXTRACTION W/PHACO Left 09/14/2016   Procedure: CATARACT EXTRACTION PHACO AND INTRAOCULAR LENS PLACEMENT (IOC);  Surgeon: Jethro Bolus, MD;  Location: [facility];  Service: Ophthalmology;  Laterality: Left;  CDE: 9.32   COLONOSCOPY     colonscopy     HEMORRHOID SURGERY     LEFT HEART CATH AND CORONARY ANGIOGRAPHY N/A 12/17/2021   Procedure: LEFT HEART CATH AND CORONARY ANGIOGRAPHY;  Surgeon: Lennette Bihari, MD;  Location: [facility];  Service: Cardiovascular;  Laterality: N/A;   TRANSTHORACIC ECHOCARDIOGRAM  2009   normal LV & RV systolic function; mild mitral annular calcif & mild MR; trace TR    SOCIAL HISTORY:  Social History   Socioeconomic History   Marital status: Married    Spouse name: Not on file   Number of children: Not on file   Years of education: 10    Highest education level: Not on file  Occupational History   Occupation: retired    Associate Professor: RETIRED  Tobacco Use   Smoking status: Never    Passive exposure: Past   Smokeless tobacco: Never  Vaping Use   Vaping Use: Never used  Substance and Sexual Activity   Alcohol use: No   Drug use: No   Sexual activity: Yes    Birth control/protection: Surgical  Other Topics Concern   Not on file  Social History Narrative   Married with three  children   Fayrene Fearing- age 59, Lisa age 88 and [place] age 48- all reside in [place]   Social Determinants of Health   Financial Resource Strain: Not on file  Food Insecurity: Not on file  Transportation Needs: Not on file  Physical Activity: Not on file  Stress: Not on file  Social Connections: Not on file  Intimate Partner Violence: Not on file    FAMILY HISTORY:  Family History  Problem Relation Age of Onset   Colon cancer Mother        also stroke   Pancreatic cancer Father        also heart disease   Diabetes Sister    Hypertension Sister    Anesthesia problems Neg Hx    Hypotension Neg Hx    Malignant hyperthermia Neg Hx    Pseudochol deficiency Neg Hx    Stomach cancer Neg Hx    Rectal cancer Neg Hx     CURRENT MEDICATIONS:  Current Outpatient Medications  Medication Sig Dispense Refill   albuterol (VENTOLIN HFA) 108 (90 Base) MCG/ACT inhaler Inhale 1-2 puffs into the lungs every 4 (four) hours as needed for wheezing or shortness of breath. (Patient not taking: Reported on 11/24/2022) 1 each 0   amLODipine (NORVASC) 5 MG tablet TAKE 1 TABLET(5 MG) BY MOUTH DAILY 90 tablet 3   carvedilol (COREG) 12.5 MG tablet Take 1 tablet (12.5 mg total) by mouth 2 (two) times daily. 180 tablet 3   citalopram (CELEXA) 20 MG tablet Take 20 mg by mouth daily.     hydrocortisone cream 0.5 % Apply 1 application topically 2 (two) times daily as needed for itching. For hemorrhoid pain     levothyroxine (SYNTHROID, LEVOTHROID) 50 MCG tablet Take 50 mcg by mouth daily.     LORazepam (ATIVAN) 1 MG tablet Take 1 mg by mouth at bedtime. For anxiety may take extra tablet if needed     nitroGLYCERIN (NITROSTAT) 0.4 MG SL tablet Place 1 tablet (0.4 mg total) under the tongue every 5 (five) minutes as needed for chest pain. 100 tablet 0   omeprazole (PRILOSEC) 40 MG capsule TAKE 1 CAPSULE BY MOUTH SHORTLY BEFORE BREAKFAST DAILY. (Patient taking differently: Take 40 mg by mouth daily. TAKE 1 CAPSULE  BY MOUTH SHORTLY BEFORE BREAKFAST DAILY.) 90 capsule 0   psyllium (METAMUCIL) 58.6 % powder Take 1 packet by mouth daily as needed (constipation).     rosuvastatin (CRESTOR) 40 MG tablet TAKE 1 TABLET(40 MG) BY MOUTH DAILY 90 tablet 3   Spacer/Aero-Holding Chambers (AEROCHAMBER PLUS) inhaler Use with inhaler (Patient not taking: Reported on 11/24/2022) 1 each 2   triamcinolone (KENALOG) 0.1 % Apply 1 application topically daily.     Current Facility-Administered Medications  Medication Dose Route Frequency Provider Last Rate Last Admin   0.9 %  sodium chloride infusion  500 mL Intravenous Once Rachael Fee, MD        ALLERGIES:  Allergies  Allergen Reactions   Bisoprolol-Hydrochlorothiazide Other (See Comments)    Reaction unknown Reaction unknown   Hydrocodone Other (See Comments)    Reaction unknown Reaction unknown Reaction unknown   Lipitor [Atorvastatin] Itching   Lisinopril Itching   Oxycodone Itching   Oxycodone Hcl Itching   Ziac [Bisoprolol-Hydrochlorothiazide] Other (See Comments)    Reaction unknown   Penicillins Rash    Has patient had a PCN reaction causing immediate rash, facial/tongue/throat swelling, SOB or lightheadedness with hypotension: unknown Has patient had a PCN reaction causing severe rash involving mucus membranes or skin necrosis: unknown Has patient had a PCN reaction that required hospitalization No Has patient had a PCN reaction occurring within the last 10 years: No If all of the above answers are "NO", then may proceed with Cephalosporin use.     LABORATORY DATA:  I have reviewed the labs as listed.     Latest Ref Rng & Units 12/07/2022    8:04 AM 12/17/2021    2:40 AM 12/16/2021   12:20 AM  CBC  WBC 4.0 - 10.5 K/uL 6.2  8.4  10.2   Hemoglobin 12.0 - 15.0 g/dL 98.1  19.1  47.8   Hematocrit 36.0 - 46.0 % 35.9  36.1  36.4   Platelets 150 - 400 K/uL 282  268  274       Latest Ref Rng & Units 12/07/2022    8:04 AM 02/15/2022   10:24 AM  12/17/2021    2:40 AM  CMP  Glucose 70 - 99 mg/dL 295  621  308   BUN 8 - 23 mg/dL 18  17  13    Creatinine 0.44 - 1.00 mg/dL 6.57  8.46  9.62   Sodium 135 - 145 mmol/L 138  140  136   Potassium 3.5 - 5.1 mmol/L 4.2  4.3  3.6   Chloride 98 - 111 mmol/L 101  104  101   CO2 22 - 32 mmol/L 29  29  24    Calcium 8.9 - 10.3 mg/dL 8.7  8.7  8.8   Total Protein 6.5 - 8.1 g/dL 6.6  6.9    Total Bilirubin 0.3 - 1.2 mg/dL 0.4  0.1    Alkaline Phos 38 - 126 U/L 55  101    AST 15 - 41 U/L 17  20    ALT 0 - 44 U/L 14  20      DIAGNOSTIC IMAGING:  I have independently reviewed the scans and discussed with the patient. MM 3D SCREEN BREAST BILATERAL  Result Date: 12/07/2022 CLINICAL DATA:  Screening. Status post right lumpectomy in 2013 EXAM: DIGITAL SCREENING BILATERAL MAMMOGRAM WITH TOMOSYNTHESIS AND CAD TECHNIQUE: Bilateral screening digital craniocaudal and mediolateral oblique mammograms were obtained. Bilateral screening digital breast tomosynthesis was performed. The images were evaluated with computer-aided detection.  COMPARISON:  Previous exam(s). ACR Breast Density Category b: There are scattered areas of fibroglandular density. FINDINGS: Stable postlumpectomy changes in the right breast. There are no findings suspicious for malignancy. IMPRESSION: No mammographic evidence of malignancy. A result letter of this screening mammogram will be mailed directly to the patient. RECOMMENDATION: Screening mammogram in one year. (Code:SM-B-01Y) BI-RADS CATEGORY  2: Benign. Electronically Signed   By: Jacob Moores M.D.   On: 12/07/2022 10:42     WRAP UP:  All questions were answered. The patient knows to call the clinic with any problems, questions or concerns.  Medical decision making: Moderate  Time spent on visit: I spent 20 minutes counseling the patient face to face. The total time spent in the appointment was 30 minutes and more than 50% was on counseling.  Carnella Guadalajara, PA-C  12/13/22  10:41 AM

## 2022-12-13 ENCOUNTER — Inpatient Hospital Stay (HOSPITAL_BASED_OUTPATIENT_CLINIC_OR_DEPARTMENT_OTHER): Payer: Medicare PPO | Admitting: Physician Assistant

## 2022-12-13 VITALS — BP 138/87 | HR 84 | Temp 97.7°F | Resp 18 | Ht 62.0 in | Wt 162.0 lb

## 2022-12-13 DIAGNOSIS — C50411 Malignant neoplasm of upper-outer quadrant of right female breast: Secondary | ICD-10-CM

## 2022-12-13 DIAGNOSIS — Z1231 Encounter for screening mammogram for malignant neoplasm of breast: Secondary | ICD-10-CM

## 2022-12-13 DIAGNOSIS — Z9223 Personal history of estrogen therapy: Secondary | ICD-10-CM | POA: Diagnosis not present

## 2022-12-13 DIAGNOSIS — Z170 Estrogen receptor positive status [ER+]: Secondary | ICD-10-CM | POA: Diagnosis not present

## 2022-12-13 DIAGNOSIS — Z853 Personal history of malignant neoplasm of breast: Secondary | ICD-10-CM | POA: Diagnosis not present

## 2022-12-13 DIAGNOSIS — Z08 Encounter for follow-up examination after completed treatment for malignant neoplasm: Secondary | ICD-10-CM | POA: Diagnosis not present

## 2022-12-13 NOTE — Patient Instructions (Signed)
[facility] at [facility] **VISIT SUMMARY & IMPORTANT INSTRUCTIONS **   You were seen today by Rojelio Brenner PA-C for your history of right-sided breast cancer.   Your most recent mammogram, labs, and physical exam today did not show any evidence of recurrent breast cancer.   FOLLOW-UP APPOINTMENT: Office visit in 1 year, with labs and mammogram at least 1 week before hand.  ** Thank you for trusting me with your healthcare!  I strive to provide all of my patients with quality care at each visit.  If you receive a survey for this visit, I would be so grateful to you for taking the time to provide feedback.  Thank you in advance!  ~ Alea Ryer                   Dr. Doreatha Massed   &   Rojelio Brenner, PA-C   - - - - - - - - - - - - - - - - - -    Thank you for choosing [facility] at [facility] to provide your oncology and hematology care.  To afford each patient quality time with our provider, please arrive at least 15 minutes before your scheduled appointment time.   If you have a lab appointment with the Cancer Center please come in thru the Main Entrance and check in at the main information desk.  You need to re-schedule your appointment should you arrive 10 or more minutes late.  We strive to give you quality time with our providers, and arriving late affects you and other patients whose appointments are after yours.  Also, if you no show three or more times for appointments you may be dismissed from the clinic at the providers discretion.     Again, thank you for choosing [facility].  Our hope is that these requests will decrease the amount of time that you wait before being seen by our physicians.       _____________________________________________________________  Should you have questions after your visit to [facility], please contact our office at [phone] and follow the prompts.  Our office  hours are 8:00 a.m. and 4:30 p.m. Monday - Friday.  Please note that voicemails left after 4:00 p.m. may not be returned until the following business day.  We are closed weekends and major holidays.  You do have access to a nurse 24-7, just call the main number to the clinic [phone] and do not press any options, hold on the line and a nurse will answer the phone.    For prescription refill requests, have your pharmacy contact our office and allow 72 hours.

## 2022-12-31 DIAGNOSIS — F419 Anxiety disorder, unspecified: Secondary | ICD-10-CM | POA: Diagnosis not present

## 2022-12-31 DIAGNOSIS — I10 Essential (primary) hypertension: Secondary | ICD-10-CM | POA: Diagnosis not present

## 2022-12-31 DIAGNOSIS — G4733 Obstructive sleep apnea (adult) (pediatric): Secondary | ICD-10-CM | POA: Diagnosis not present

## 2023-01-09 DIAGNOSIS — G4733 Obstructive sleep apnea (adult) (pediatric): Secondary | ICD-10-CM | POA: Diagnosis not present

## 2023-01-11 DIAGNOSIS — G4730 Sleep apnea, unspecified: Secondary | ICD-10-CM | POA: Diagnosis not present

## 2023-01-11 DIAGNOSIS — G4733 Obstructive sleep apnea (adult) (pediatric): Secondary | ICD-10-CM | POA: Diagnosis not present

## 2023-01-12 ENCOUNTER — Ambulatory Visit (INDEPENDENT_AMBULATORY_CARE_PROVIDER_SITE_OTHER): Payer: Medicare PPO | Admitting: Primary Care

## 2023-01-12 ENCOUNTER — Ambulatory Visit (INDEPENDENT_AMBULATORY_CARE_PROVIDER_SITE_OTHER): Payer: Medicare PPO

## 2023-01-12 ENCOUNTER — Encounter: Payer: Self-pay | Admitting: Primary Care

## 2023-01-12 VITALS — BP 146/76 | HR 83 | Temp 99.1°F | Ht 62.0 in | Wt 166.4 lb

## 2023-01-12 DIAGNOSIS — N1832 Chronic kidney disease, stage 3b: Secondary | ICD-10-CM

## 2023-01-12 DIAGNOSIS — G4730 Sleep apnea, unspecified: Secondary | ICD-10-CM | POA: Diagnosis not present

## 2023-01-12 DIAGNOSIS — R0602 Shortness of breath: Secondary | ICD-10-CM | POA: Diagnosis not present

## 2023-01-12 DIAGNOSIS — R0989 Other specified symptoms and signs involving the circulatory and respiratory systems: Secondary | ICD-10-CM | POA: Diagnosis not present

## 2023-01-12 DIAGNOSIS — N189 Chronic kidney disease, unspecified: Secondary | ICD-10-CM | POA: Insufficient documentation

## 2023-01-12 DIAGNOSIS — R1900 Intra-abdominal and pelvic swelling, mass and lump, unspecified site: Secondary | ICD-10-CM

## 2023-01-12 DIAGNOSIS — G4733 Obstructive sleep apnea (adult) (pediatric): Secondary | ICD-10-CM | POA: Diagnosis not present

## 2023-01-12 DIAGNOSIS — I700 Atherosclerosis of aorta: Secondary | ICD-10-CM | POA: Diagnosis not present

## 2023-01-12 DIAGNOSIS — R9389 Abnormal findings on diagnostic imaging of other specified body structures: Secondary | ICD-10-CM | POA: Diagnosis not present

## 2023-01-12 LAB — BASIC METABOLIC PANEL
BUN: 14 mg/dL (ref 6–23)
CO2: 32 mEq/L (ref 19–32)
Calcium: 9.3 mg/dL (ref 8.4–10.5)
Chloride: 101 mEq/L (ref 96–112)
Creatinine, Ser: 1.26 mg/dL — ABNORMAL HIGH (ref 0.40–1.20)
GFR: 40.06 mL/min — ABNORMAL LOW (ref >60.00)
Glucose, Bld: 110 mg/dL — ABNORMAL HIGH (ref 70–99)
Potassium: 4.6 mEq/L (ref 3.5–5.1)
Sodium: 139 mEq/L (ref 135–145)

## 2023-01-12 LAB — BRAIN NATRIURETIC PEPTIDE: Pro B Natriuretic peptide (BNP): 89 pg/mL (ref 0.0–100.0)

## 2023-01-12 NOTE — Patient Instructions (Addendum)
We will call you after we get CXR and labs back- recommend you reach out to nephrologist regarding your symptoms  Continue to wear cpap nightly Adjusting pressure to 9cm h20   Orders: Change CPAP 9cm h20  ONO CPAP  CXR and Labs today (ordered)  Follow-up: 3 months with Waynetta Sandy NP

## 2023-01-12 NOTE — Progress Notes (Signed)
Please let patient know CXR showed no acute findings. No pneumonia or fluid in lungs.

## 2023-01-12 NOTE — Progress Notes (Signed)
@Patient  ID: Jane Wilkerson, female    DOB: 1940/10/11, 82 y.o.   MRN: [id number]  Chief Complaint  Patient presents with   Follow-up    Feels like she is retaining fluid    Referring provider: Elfredia Nevins, MD  HPI: 82 year old female, never smoked.  Past medical history significant for OSA, hypertension, paroxysmal SVT, NSTEMI, coronary artery disease, hypothyroidism.   Previous [facility] pulmonary encounter: 11/24/2022 Patient presents today for sleep consult. Accompanied by her husband. She has a significant cardiac history including paroxysmal SVT, NSTEMI and HTN. She has symptoms of snoring and daytime sleepiness. She is very tired throughout the day and requires a nap most afternoons. She had a home sleep test with [facility] on 10/15/22 showed severe OSA, AHI 40.5 an hour with SpO2 low 67%.  She spent 66 minutes with O2 sat less than 88%.  Severe snoring was noted.  We discussed sleep study results today and treatment options.  Due to the severity of her sleep apnea recommending she be tried on CPAP.  Patient is somewhat hesitant due to claustrophobia but agreeing to plan.   Sleep questionnaire Symptoms-  Daytime fatigue; Sleep apnea  Prior sleep study- April 2024>> severe OSA>> AHI 40/hour  Bedtime- 11pm  Time to fall asleep- 1 hour Nocturnal awakenings- three  Out of bed/start of day- 11am Weight changes- 20 lbs Do you operate heavy machinery- no Do you currently wear CPAP- no Do you current wear oxygen- no Epworth- 5  Social hx: Patient is married.  She has children.  She lives with her spouse.  She is retired.  No alcohol or tobacco use.   01/12/2023- Interim hx  Patient presents today for CPAP compliance check. She had a home sleep test with [facility] on 10/15/22 showed severe OSA, AHI 40.5 an hour with SpO2 low 67%. She spent 66 minutes with O2 sat less than 88%. Severe snoring was noted. She was ordered to start auto CPAP 5-15cm H20.    Patient is compliant with CPAP use  but has seen no clinical benefit from wearing. Her husband reports that she is no longer snoring. She still feels tired during the day. Feels pressure is not strong enough at the beginning of the night and has trouble getting her breath. Her main compliant today is weight gain and abdominal swelling. She tells me she has a hard time getting her rings on. She follows with nephrology for stage 3 chronic kidney disease and will be seeing them at the end of the month. She was on diuretics but not currently. Her anxiety has also been worse recently.   Airview download 12/12/22-01/10/23 Usage 29/30 days (97%) > 4 hours  Average usage 9 hours 11 mins Pressure 5-15cm h20 (9.8cm h20-95%) Airleaks 12.6L/min (95%) AHI 1.3    Allergies  Allergen Reactions   Bisoprolol-Hydrochlorothiazide Other (See Comments)    Reaction unknown Reaction unknown   Hydrocodone Other (See Comments)    Reaction unknown Reaction unknown Reaction unknown   Lipitor [Atorvastatin] Itching   Lisinopril Itching   Oxycodone Itching   Oxycodone Hcl Itching   Ziac [Bisoprolol-Hydrochlorothiazide] Other (See Comments)    Reaction unknown   Penicillins Rash    Has patient had a PCN reaction causing immediate rash, facial/tongue/throat swelling, SOB or lightheadedness with hypotension: unknown Has patient had a PCN reaction causing severe rash involving mucus membranes or skin necrosis: unknown Has patient had a PCN reaction that required hospitalization No Has patient had a PCN reaction occurring within the last  10 years: No If all of the above answers are "NO", then may proceed with Cephalosporin use.     Immunization History  Administered Date(s) Administered   Pneumococcal Conjugate-13 07/08/2014   Pneumococcal Polysaccharide-23 07/07/2009   Tdap 11/19/2017    Past Medical History:  Diagnosis Date   Allergy    Anxiety 07/07/2011   takes Ativan every 4hrs   Arthritis    back    Asthma    uses inhaler prn   Breast  cancer (HCC)    right - local excision, chemotheraphy   Cataract    right eye   Chronic back pain    Depression    has appt to discuss depression 07/26/2011   Diverticulosis    Dizziness    occasionally   Gastric ulcer    GERD (gastroesophageal reflux disease)    takes Nexium daily   Hemorrhoids    History of nuclear stress test 11/2010   lexiscan; no evidence of inducible ischemia; normal pattern of perfusion    History of shingles 2011   Hx of colonic polyps    Hyperlipidemia    takes Zocor daily   Hypertension    takes Bystolic and Losartan daily   Hypothyroidism    takes Synthroid daily   Nausea alone    onset last Wednesday  08/04/11   Nocturia    Osteoporosis    PONV (postoperative nausea and vomiting)    PVC's (premature ventricular contractions)    Shortness of breath    with exertion   Skin rash    Urinary frequency    Urticaria of unknown origin     Tobacco History: Social History   Tobacco Use  Smoking Status Never   Passive exposure: Past  Smokeless Tobacco Never   Counseling given: Not Answered   Outpatient Medications Prior to Visit  Medication Sig Dispense Refill   amLODipine (NORVASC) 5 MG tablet TAKE 1 TABLET(5 MG) BY MOUTH DAILY 90 tablet 3   carvedilol (COREG) 12.5 MG tablet Take 1 tablet (12.5 mg total) by mouth 2 (two) times daily. (Patient taking differently: Take 6.25 mg by mouth 2 (two) times daily.) 180 tablet 3   citalopram (CELEXA) 20 MG tablet Take 20 mg by mouth daily.     hydrocortisone cream 0.5 % Apply 1 application topically 2 (two) times daily as needed for itching. For hemorrhoid pain     levothyroxine (SYNTHROID, LEVOTHROID) 50 MCG tablet Take 50 mcg by mouth daily.     LORazepam (ATIVAN) 1 MG tablet Take 1 mg by mouth at bedtime. For anxiety may take extra tablet if needed     nitroGLYCERIN (NITROSTAT) 0.4 MG SL tablet Place 1 tablet (0.4 mg total) under the tongue every 5 (five) minutes as needed for chest pain. 100 tablet 0    omeprazole (PRILOSEC) 40 MG capsule TAKE 1 CAPSULE BY MOUTH SHORTLY BEFORE BREAKFAST DAILY. (Patient taking differently: Take 40 mg by mouth daily. TAKE 1 CAPSULE BY MOUTH SHORTLY BEFORE BREAKFAST DAILY.) 90 capsule 0   psyllium (METAMUCIL) 58.6 % powder Take 1 packet by mouth daily as needed (constipation).     rosuvastatin (CRESTOR) 40 MG tablet TAKE 1 TABLET(40 MG) BY MOUTH DAILY 90 tablet 3   Spacer/Aero-Holding Chambers (AEROCHAMBER PLUS) inhaler Use with inhaler 1 each 2   triamcinolone (KENALOG) 0.1 % Apply 1 application topically daily.     Facility-Administered Medications Prior to Visit  Medication Dose Route Frequency Provider Last Rate Last Admin   0.9 %  sodium  chloride infusion  500 mL Intravenous Once Rachael Fee, MD        Review of Systems  Review of Systems  Constitutional:  Positive for fatigue and unexpected weight change.  Respiratory:  Positive for shortness of breath.   Gastrointestinal:        Abdominal bloating    Physical Exam  BP (!) 146/76 (BP Location: Left Arm, Patient Position: Sitting, Cuff Size: Large)   Pulse 83   Temp 99.1 F (37.3 C) (Oral)   Ht 5\' 2"  (1.575 m)   Wt 166 lb 6.4 oz (75.5 kg)   SpO2 96%   BMI 30.43 kg/m  Physical Exam Constitutional:      Appearance: Normal appearance.  HENT:     Head: Normocephalic and atraumatic.  Cardiovascular:     Rate and Rhythm: Normal rate and regular rhythm.     Comments: No leg swelling  Pulmonary:     Effort: Pulmonary effort is normal. No respiratory distress.     Breath sounds: Rales present. No wheezing or rhonchi.     Comments: Scant rales at lung bases  Neurological:     General: No focal deficit present.     Mental Status: She is alert and oriented to person, place, and time. Mental status is at baseline.     Comments: Anxious  Psychiatric:        Mood and Affect: Mood normal.        Behavior: Behavior normal.        Thought Content: Thought content normal.        Judgment:  Judgment normal.      Lab Results:  CBC    Component Value Date/Time   WBC 6.2 12/07/2022 0804   RBC 4.04 12/07/2022 0804   HGB 11.2 (L) 12/07/2022 0804   HGB 11.8 11/07/2014 1020   HCT 35.9 (L) 12/07/2022 0804   HCT 35.8 11/07/2014 1020   PLT 282 12/07/2022 0804   PLT 286 11/07/2014 1020   MCV 88.9 12/07/2022 0804   MCV 88.0 11/07/2014 1020   MCH 27.7 12/07/2022 0804   MCHC 31.2 12/07/2022 0804   RDW 14.2 12/07/2022 0804   RDW 14.1 11/07/2014 1020   LYMPHSABS 1.2 12/07/2022 0804   LYMPHSABS 1.0 11/07/2014 1020   MONOABS 0.9 12/07/2022 0804   MONOABS 0.9 11/07/2014 1020   EOSABS 0.3 12/07/2022 0804   EOSABS 0.2 11/07/2014 1020   BASOSABS 0.1 12/07/2022 0804   BASOSABS 0.1 11/07/2014 1020    BMET    Component Value Date/Time   NA 138 12/07/2022 0804   NA 139 11/07/2014 1021   K 4.2 12/07/2022 0804   K 4.3 11/07/2014 1021   CL 101 12/07/2022 0804   CL 100 08/04/2012 0921   CO2 29 12/07/2022 0804   CO2 26 11/07/2014 1021   GLUCOSE 134 (H) 12/07/2022 0804   GLUCOSE 110 11/07/2014 1021   GLUCOSE 109 (H) 08/04/2012 0921   BUN 18 12/07/2022 0804   BUN 12.9 11/07/2014 1021   CREATININE 1.23 (H) 12/07/2022 0804   CREATININE 1.21 (H) 10/04/2016 1022   CREATININE 1.2 (H) 11/07/2014 1021   CALCIUM 8.7 (L) 12/07/2022 0804   CALCIUM 9.1 11/07/2014 1021   GFRNONAA 44 (L) 12/07/2022 0804   GFRAA 49 (L) 08/30/2016 0814    BNP No results found for: "BNP"  ProBNP No results found for: "PROBNP"  Imaging: No results found.   Assessment & Plan:   OSA (obstructive sleep apnea) - Patient was seen for  sleep consult in May for snoring symptoms and daytime sleepiness. She had HST in April 2024 that showed severe OSA, AHI 40.5/hour with SpO2 low 67%. She was started on auto CPAP in May. She is 97% compliant with use but has seen no clinical benefit from therapy. Still reports persistent fatigue. Current pressure 5-15cm h20 (9cm h20-95%); Residual AHI 1.3/hour. We will try  her on a set pressure 9cm h20 to help with comfort. Needs ONO on CPAP to assess oxygen levels on PAP therapy. Advised patient continue to wear CPAP nightly. FU in 3 months or sooner if needed.   Chronic kidney disease - Patient reports weight gain, fluid retention and shortness of breath. Not currently on diuretics. We will check BMET and BNP. Follows with nephrology.    Glenford Bayley, NP 01/12/2023

## 2023-01-12 NOTE — Assessment & Plan Note (Signed)
-   Patient reports weight gain, fluid retention and shortness of breath. Not currently on diuretics. We will check BMET and BNP. Follows with nephrology.

## 2023-01-12 NOTE — Assessment & Plan Note (Signed)
-   Patient was seen for sleep consult in May for snoring symptoms and daytime sleepiness. She had HST in April 2024 that showed severe OSA, AHI 40.5/hour with SpO2 low 67%. She was started on auto CPAP in May. She is 97% compliant with use but has seen no clinical benefit from therapy. Still reports persistent fatigue. Current pressure 5-15cm h20 (9cm h20-95%); Residual AHI 1.3/hour. We will try her on a set pressure 9cm h20 to help with comfort. Needs ONO on CPAP to assess oxygen levels on PAP therapy. Advised patient continue to wear CPAP nightly. FU in 3 months or sooner if needed.

## 2023-01-14 NOTE — Progress Notes (Signed)
Reviewed and agree with assessment/plan.   Coralyn Helling, MD [facility] Pulmonary/Critical Care 01/14/2023, 8:52 AM Pager:  [phone]

## 2023-01-25 DIAGNOSIS — I5032 Chronic diastolic (congestive) heart failure: Secondary | ICD-10-CM | POA: Diagnosis not present

## 2023-01-25 DIAGNOSIS — I129 Hypertensive chronic kidney disease with stage 1 through stage 4 chronic kidney disease, or unspecified chronic kidney disease: Secondary | ICD-10-CM | POA: Diagnosis not present

## 2023-01-25 DIAGNOSIS — G4733 Obstructive sleep apnea (adult) (pediatric): Secondary | ICD-10-CM | POA: Diagnosis not present

## 2023-01-25 DIAGNOSIS — N1832 Chronic kidney disease, stage 3b: Secondary | ICD-10-CM | POA: Diagnosis not present

## 2023-02-07 ENCOUNTER — Telehealth: Payer: Self-pay | Admitting: Primary Care

## 2023-02-07 DIAGNOSIS — G4733 Obstructive sleep apnea (adult) (pediatric): Secondary | ICD-10-CM

## 2023-02-07 DIAGNOSIS — G4734 Idiopathic sleep related nonobstructive alveolar hypoventilation: Secondary | ICD-10-CM

## 2023-02-07 NOTE — Telephone Encounter (Signed)
Overnight oximetry test on CPAP on 01/11/2023 showed patient spent 2 hours 36 minutes with O2 less than 88%.  Patient needs CPAP titration study in lab to assess oxygen liter flow rate. I have ordered, please call patient to let her know the plan

## 2023-02-07 NOTE — Telephone Encounter (Signed)
Sleep study auth approved, sent to be scheduled by sleep center.

## 2023-02-08 NOTE — Telephone Encounter (Signed)
Called patient.  Informed patient that [facility] will be calling to set up in labn CPAP titration study in lab at [facility].  Patient verbalized understanding and will wait for call to set up test.  Nothing further needed at this time.

## 2023-02-09 DIAGNOSIS — G4733 Obstructive sleep apnea (adult) (pediatric): Secondary | ICD-10-CM | POA: Diagnosis not present

## 2023-02-17 ENCOUNTER — Encounter: Payer: Self-pay | Admitting: Primary Care

## 2023-03-11 DIAGNOSIS — G4733 Obstructive sleep apnea (adult) (pediatric): Secondary | ICD-10-CM | POA: Diagnosis not present

## 2023-03-12 DIAGNOSIS — G4733 Obstructive sleep apnea (adult) (pediatric): Secondary | ICD-10-CM | POA: Diagnosis not present

## 2023-03-15 DIAGNOSIS — G5601 Carpal tunnel syndrome, right upper limb: Secondary | ICD-10-CM | POA: Diagnosis not present

## 2023-03-15 DIAGNOSIS — Z6830 Body mass index (BMI) 30.0-30.9, adult: Secondary | ICD-10-CM | POA: Diagnosis not present

## 2023-03-15 DIAGNOSIS — E6609 Other obesity due to excess calories: Secondary | ICD-10-CM | POA: Diagnosis not present

## 2023-03-16 ENCOUNTER — Encounter: Payer: Medicare PPO | Admitting: Pulmonary Disease

## 2023-03-29 ENCOUNTER — Encounter: Payer: Medicare PPO | Admitting: Pulmonary Disease

## 2023-03-29 ENCOUNTER — Ambulatory Visit: Payer: Medicare PPO | Attending: Primary Care | Admitting: Pulmonary Disease

## 2023-03-29 DIAGNOSIS — G4734 Idiopathic sleep related nonobstructive alveolar hypoventilation: Secondary | ICD-10-CM | POA: Diagnosis not present

## 2023-03-29 DIAGNOSIS — G4733 Obstructive sleep apnea (adult) (pediatric): Secondary | ICD-10-CM | POA: Diagnosis not present

## 2023-04-07 DIAGNOSIS — G4733 Obstructive sleep apnea (adult) (pediatric): Secondary | ICD-10-CM | POA: Diagnosis not present

## 2023-04-07 NOTE — Procedures (Signed)
Patient Name: Jane Wilkerson, Jane Wilkerson Date: 03/29/2023 Gender: Female D.O.B: 12-04-1940 Age (years): 16 Referring Provider: Ames Dura NP Height (inches): 62 Interpreting Physician: Cyril Mourning MD, ABSM Weight (lbs): 166 RPSGT: Alfonso Ellis BMI: 30 MRN: [id number] Neck Size: 15.00 <br> <br> CLINICAL INFORMATION The patient is referred for a CPAP titration to treat sleep apnea.    She had a home sleep test with [facility] on 10/15/22 showed severe OSA, AHI 40.5 an hour with SpO2 low 67%.   SLEEP STUDY TECHNIQUE As per the AASM Manual for the Scoring of Sleep and Associated Events v2.3 (April 2016) with a hypopnea requiring 4% desaturations.  The channels recorded and monitored were frontal, central and occipital EEG, electrooculogram (EOG), submentalis EMG (chin), nasal and oral airflow, thoracic and abdominal wall motion, anterior tibialis EMG, snore microphone, electrocardiogram, and pulse oximetry. Continuous positive airway pressure (CPAP) was initiated at the beginning of the study and titrated to treat sleep-disordered breathing.  MEDICATIONS Medications self-administered by patient taken the night of the study : CARVEDILOL, CRESTOR, LORAZEPAM  TECHNICIAN COMMENTS Comments added by technician: Patient had difficulty initiating sleep. Patient was restless at times prior to maintaining sleep, latter part of study. TRT increased due to patients' inability to obtain and maintain sleep, until latter part of study. Patient did not meet lab protocol for O2 supplement. Suboptimal pressure obtained due to REM- supine stage was not observed. CPAP therapy started at 4 CWP and increased to 6 CWP due to events and snoring episodes. Patient used 2 pillows to prop-up due to GERD. PLMS noted during first part of study. Patient tolerated CPAP titration very well Comments added by scorer: N/A RESPIRATORY PARAMETERS Optimal PAP Pressure (cm):  AHI at Optimal Pressure (/hr): N/A Overall Minimal  O2 (%): 86.00 Supine % at Optimal Pressure (%): N/A Minimal O2 at Optimal Pressure (%): 84.00   SLEEP ARCHITECTURE The study was initiated at 10:40:46 PM and ended at 6:13:20 AM.  Sleep onset time was 151.2 minutes and the sleep efficiency was 28.4%. The total sleep time was 128.3 minutes.  The patient spent 7.40% of the night in stage N1 sleep, 39.74% in stage N2 sleep, 52.86% in stage N3 and 0% in REM.Stage REM latency was N/A minutes  Wake after sleep onset was 173.0. Alpha intrusion was absent. Supine sleep was 87.14%.  CARDIAC DATA The 2 lead EKG demonstrated sinus rhythm. The mean heart rate was 77.75 beats per minute. Other EKG findings include: None.   LEG MOVEMENT DATA The total Periodic Limb Movements of Sleep (PLMS) were 138. The PLMS index was 64.51. A PLMS index of <15 is considered normal in adults.  IMPRESSIONS - An optimal PAP pressure could not be selected for this patient based on the available study data.CPAP 6cm seemed optimal but no REM sleep noted at this level - Moderate oxygen desaturations were observed during this titration (min O2 = 86.00%). - The patient snored with soft snoring volume during this titration study. - No cardiac abnormalities were observed during this study. - Severe periodic limb movements were observed during this study. Arousals associated with PLMs were rare.   DIAGNOSIS - Obstructive Sleep Apnea (G47.33)   RECOMMENDATIONS - Recommend a trial of Auto-CPAP 6-12 cm H2O with med nasal cradle mask - Avoid alcohol, sedatives and other CNS depressants that may worsen sleep apnea and disrupt normal sleep architecture. - Sleep hygiene should be reviewed to assess factors that may improve sleep quality. - Weight management and regular exercise should be initiated or continued. -  Return to Sleep Center for re-evaluation after 4 weeks of therapy  [Electronically signed] 04/07/2023 06:02 PM  Cyril Mourning MD, ABSM Diplomate, American Board of  Sleep Medicine NPI: [id number]

## 2023-04-11 DIAGNOSIS — G4733 Obstructive sleep apnea (adult) (pediatric): Secondary | ICD-10-CM | POA: Diagnosis not present

## 2023-04-14 ENCOUNTER — Ambulatory Visit: Payer: Medicare PPO | Admitting: Primary Care

## 2023-04-14 ENCOUNTER — Encounter: Payer: Self-pay | Admitting: Primary Care

## 2023-04-14 VITALS — BP 100/70 | HR 89 | Temp 98.1°F | Ht 62.0 in | Wt 163.6 lb

## 2023-04-14 DIAGNOSIS — Z23 Encounter for immunization: Secondary | ICD-10-CM | POA: Diagnosis not present

## 2023-04-14 DIAGNOSIS — G4733 Obstructive sleep apnea (adult) (pediatric): Secondary | ICD-10-CM | POA: Diagnosis not present

## 2023-04-14 MED ORDER — PROMETHAZINE-DM 6.25-15 MG/5ML PO SYRP: 2.5000 mL | ORAL_SOLUTION | Freq: Every evening | ORAL | 0 refills | Status: DC | PRN

## 2023-04-14 NOTE — Patient Instructions (Addendum)
Recommendations: Continue to wear CPAP every night  Take Promethazine-DM at bedtime for cough; may repeat once if needed  Do not eat large meal after 9pm  Aim to go to bed by 11pm-12am  Orders: Adjust CPAP pressure 6-12cm h20 Needs chin strap   Follow-up 6 months with Dr. Wynona Neat (30 min)

## 2023-04-14 NOTE — Progress Notes (Signed)
 @Patient  ID: Jane Wilkerson, female    DOB: Sep 26, 1940, 82 y.o.   MRN: [id number]  Chief Complaint  Patient presents with   Follow-up    Referring provider: Elfredia Nevins, MD  HPI: 82 year old female, never smoked.  Past medical history significant for OSA, hypertension, paroxysmal SVT, NSTEMI, coronary artery disease, hypothyroidism.   Previous [facility] pulmonary encounter: 11/24/2022 Patient presents today for sleep consult. Accompanied by her husband. She has a significant cardiac history including paroxysmal SVT, NSTEMI and HTN. She has symptoms of snoring and daytime sleepiness. She is very tired throughout the day and requires a nap most afternoons. She had a home sleep test with [facility] on 10/15/22 showed severe OSA, AHI 40.5 an hour with SpO2 low 67%.  She spent 66 minutes with O2 sat less than 88%.  Severe snoring was noted.  We discussed sleep study results today and treatment options.  Due to the severity of her sleep apnea recommending she be tried on CPAP.  Patient is somewhat hesitant due to claustrophobia but agreeing to plan.   Sleep questionnaire Symptoms-  Daytime fatigue; Sleep apnea  Prior sleep study- April 2024>> severe OSA>> AHI 40/hour  Bedtime- 11pm  Time to fall asleep- 1 hour Nocturnal awakenings- three  Out of bed/start of day- 11am Weight changes- 20 lbs Do you operate heavy machinery- no Do you currently wear CPAP- no Do you current wear oxygen- no Epworth- 5  Social hx: Patient is married.  She has children.  She lives with her spouse.  She is retired.  No alcohol or tobacco use.   01/12/2023 Patient presents today for CPAP compliance check. She had a home sleep test with [facility] on 10/15/22 showed severe OSA, AHI 40.5 an hour with SpO2 low 67%. She spent 66 minutes with O2 sat less than 88%. Severe snoring was noted. She was ordered to start auto CPAP 5-15cm H20.    Patient is compliant with CPAP use but has seen no clinical benefit from wearing. Her  husband reports that she is no longer snoring. She still feels tired during the day. Feels pressure is not strong enough at the beginning of the night and has trouble getting her breath. Her main compliant today is weight gain and abdominal swelling. She tells me she has a hard time getting her rings on. She follows with nephrology for stage 3 chronic kidney disease and will be seeing them at the end of the month. She was on diuretics but not currently. Her anxiety has also been worse recently.   Airview download 12/12/22-01/10/23 Usage 29/30 days (97%) > 4 hours  Average usage 9 hours 11 mins Pressure 5-15cm h20 (9.8cm h20-95%) Airleaks 12.6L/min (95%) AHI 1.3   Overnight oximetry test on CPAP on 01/11/2023 showed patient spent 2 hours 36 minutes with O2 less than 88%.  Patient needs CPAP titration study in lab to assess oxygen liter flow rate. I have ordered, please call patient to let her know the plan   04/14/2023-Interim hx Patient presents today for follow-up OSA. She is doing well, she has no significant complaints today. Husband has parkinson's disease. He tells me that she goes to bed between 11 and 3 AM.  She will eat late at night.  She has a rattling cough at night.  She underwent CPAP titration on 03/29/23. Minimal O2 at optimal pressure was 84%. Recommend trial auto CPAP 6-12cm h20  with nasal cradle mask, needs chin strap   Airview download 03/14/2023 - 04/12/2023 Usage days 29/30  days (97%) Average usage 9 hours 37 minutes Pressure 9 cm H2O Air leaks 13.5 L/min (95%) AHI 0.6   Allergies  Allergen Reactions   Bisoprolol-Hydrochlorothiazide Other (See Comments)    Reaction unknown Reaction unknown   Hydrocodone Other (See Comments)    Reaction unknown Reaction unknown Reaction unknown   Lipitor [Atorvastatin] Itching   Lisinopril Itching   Oxycodone Itching   Oxycodone Hcl Itching   Ziac [Bisoprolol-Hydrochlorothiazide] Other (See Comments)    Reaction unknown Patient taking  hydrochlorothiazide with no reaction.   Penicillins Rash    Has patient had a PCN reaction causing immediate rash, facial/tongue/throat swelling, SOB or lightheadedness with hypotension: unknown Has patient had a PCN reaction causing severe rash involving mucus membranes or skin necrosis: unknown Has patient had a PCN reaction that required hospitalization No Has patient had a PCN reaction occurring within the last 10 years: No If all of the above answers are "NO", then may proceed with Cephalosporin use.     Immunization History  Administered Date(s) Administered   Pneumococcal Conjugate-13 07/08/2014   Pneumococcal Polysaccharide-23 07/07/2009   Tdap 11/19/2017    Past Medical History:  Diagnosis Date   Allergy    Anxiety 07/07/2011   takes Ativan every 4hrs   Arthritis    back    Asthma    uses inhaler prn   Breast cancer (HCC)    right - local excision, chemotheraphy   Cataract    right eye   Chronic back pain    Depression    has appt to discuss depression 07/26/2011   Diverticulosis    Dizziness    occasionally   Gastric ulcer    GERD (gastroesophageal reflux disease)    takes Nexium daily   Hemorrhoids    History of nuclear stress test 11/2010   lexiscan; no evidence of inducible ischemia; normal pattern of perfusion    History of shingles 2011   Hx of colonic polyps    Hyperlipidemia    takes Zocor daily   Hypertension    takes Bystolic and Losartan daily   Hypothyroidism    takes Synthroid daily   Nausea alone    onset last Wednesday  08/04/11   Nocturia    Osteoporosis    PONV (postoperative nausea and vomiting)    PVC's (premature ventricular contractions)    Shortness of breath    with exertion   Skin rash    Urinary frequency    Urticaria of unknown origin     Tobacco History: Social History   Tobacco Use  Smoking Status Never   Passive exposure: Past  Smokeless Tobacco Never   Counseling given: Not Answered   Outpatient Medications  Prior to Visit  Medication Sig Dispense Refill   amLODipine (NORVASC) 5 MG tablet TAKE 1 TABLET(5 MG) BY MOUTH DAILY 90 tablet 3   carvedilol (COREG) 6.25 MG tablet Take 6.25 mg by mouth 2 (two) times daily with a meal.     citalopram (CELEXA) 20 MG tablet Take 20 mg by mouth daily.     hydrocortisone cream 0.5 % Apply 1 application topically 2 (two) times daily as needed for itching. For hemorrhoid pain     levothyroxine (SYNTHROID, LEVOTHROID) 50 MCG tablet Take 50 mcg by mouth daily.     LORazepam (ATIVAN) 1 MG tablet Take 1 mg by mouth at bedtime. For anxiety may take extra tablet if needed     nitroGLYCERIN (NITROSTAT) 0.4 MG SL tablet Place 1 tablet (0.4 mg total)  under the tongue every 5 (five) minutes as needed for chest pain. 100 tablet 0   omeprazole (PRILOSEC) 40 MG capsule TAKE 1 CAPSULE BY MOUTH SHORTLY BEFORE BREAKFAST DAILY. (Patient taking differently: Take 40 mg by mouth daily. TAKE 1 CAPSULE BY MOUTH SHORTLY BEFORE BREAKFAST DAILY.) 90 capsule 0   psyllium (METAMUCIL) 58.6 % powder Take 1 packet by mouth daily as needed (constipation).     rosuvastatin (CRESTOR) 40 MG tablet TAKE 1 TABLET(40 MG) BY MOUTH DAILY 90 tablet 3   Spacer/Aero-Holding Chambers (AEROCHAMBER PLUS) inhaler Use with inhaler 1 each 2   triamcinolone (KENALOG) 0.1 % Apply 1 application topically daily.     carvedilol (COREG) 12.5 MG tablet Take 1 tablet (12.5 mg total) by mouth 2 (two) times daily. (Patient taking differently: Take 6.25 mg by mouth 2 (two) times daily.) 180 tablet 3   hydrochlorothiazide (HYDRODIURIL) 12.5 MG tablet Take 12.5 mg by mouth every morning. (Patient not taking: Reported on 04/14/2023)     Facility-Administered Medications Prior to Visit  Medication Dose Route Frequency Provider Last Rate Last Admin   0.9 %  sodium chloride infusion  500 mL Intravenous Once Rachael Fee, MD       Review of Systems  Review of Systems  Constitutional: Negative.   Respiratory:  Positive for  cough.   Cardiovascular: Negative.   Musculoskeletal:  Positive for arthralgias.   Physical Exam  BP 100/70 (BP Location: Left Arm, Patient Position: Sitting, Cuff Size: Large)   Pulse 89   Temp 98.1 F (36.7 C) (Oral)   Ht 5\' 2"  (1.575 m)   Wt 163 lb 9.6 oz (74.2 kg)   SpO2 95%   BMI 29.92 kg/m  Physical Exam Constitutional:      Appearance: Normal appearance.  Cardiovascular:     Rate and Rhythm: Normal rate and regular rhythm.  Pulmonary:     Effort: Pulmonary effort is normal.     Breath sounds: Normal breath sounds.  Neurological:     General: No focal deficit present.     Mental Status: She is alert and oriented to person, place, and time. Mental status is at baseline.  Psychiatric:        Mood and Affect: Mood normal.        Behavior: Behavior normal.        Thought Content: Thought content normal.        Judgment: Judgment normal.      Lab Results:  CBC    Component Value Date/Time   WBC 6.2 12/07/2022 0804   RBC 4.04 12/07/2022 0804   HGB 11.2 (L) 12/07/2022 0804   HGB 11.8 11/07/2014 1020   HCT 35.9 (L) 12/07/2022 0804   HCT 35.8 11/07/2014 1020   PLT 282 12/07/2022 0804   PLT 286 11/07/2014 1020   MCV 88.9 12/07/2022 0804   MCV 88.0 11/07/2014 1020   MCH 27.7 12/07/2022 0804   MCHC 31.2 12/07/2022 0804   RDW 14.2 12/07/2022 0804   RDW 14.1 11/07/2014 1020   LYMPHSABS 1.2 12/07/2022 0804   LYMPHSABS 1.0 11/07/2014 1020   MONOABS 0.9 12/07/2022 0804   MONOABS 0.9 11/07/2014 1020   EOSABS 0.3 12/07/2022 0804   EOSABS 0.2 11/07/2014 1020   BASOSABS 0.1 12/07/2022 0804   BASOSABS 0.1 11/07/2014 1020    BMET    Component Value Date/Time   NA 139 01/12/2023 1224   NA 139 11/07/2014 1021   K 4.6 01/12/2023 1224   K 4.3 11/07/2014 1021  CL 101 01/12/2023 1224   CL 100 08/04/2012 0921   CO2 32 01/12/2023 1224   CO2 26 11/07/2014 1021   GLUCOSE 110 (H) 01/12/2023 1224   GLUCOSE 110 11/07/2014 1021   GLUCOSE 109 (H) 08/04/2012 0921   BUN 14  01/12/2023 1224   BUN 12.9 11/07/2014 1021   CREATININE 1.26 (H) 01/12/2023 1224   CREATININE 1.21 (H) 10/04/2016 1022   CREATININE 1.2 (H) 11/07/2014 1021   CALCIUM 9.3 01/12/2023 1224   CALCIUM 9.1 11/07/2014 1021   GFRNONAA 44 (L) 12/07/2022 0804   GFRAA 49 (L) 08/30/2016 0814    BNP No results found for: "BNP"  ProBNP    Component Value Date/Time   PROBNP 89.0 01/12/2023 1224    Imaging: Sleep Study Documents  Result Date: 03/31/2023 Ordered by an unspecified provider.  Cpap titration  Result Date: 03/29/2023 Oretha Milch, MD     04/07/2023  6:03 PM Patient Name: Kameran, Lallier Date: 03/29/2023 Gender: Female D.O.B: 1940/09/02 Age (years): 54 Referring Provider: Ames Dura NP Height (inches): 62 Interpreting Physician: Cyril Mourning MD, ABSM Weight (lbs): 166 RPSGT: Alfonso Ellis BMI: 30 MRN: [id number] Neck Size: 15.00 <br> <br> CLINICAL INFORMATION The patient is referred for a CPAP titration to treat sleep apnea. She had a home sleep test with [facility] on 10/15/22 showed severe OSA, AHI 40.5 an hour with SpO2 low 67%. SLEEP STUDY TECHNIQUE As per the AASM Manual for the Scoring of Sleep and Associated Events v2.3 (April 2016) with a hypopnea requiring 4% desaturations. The channels recorded and monitored were frontal, central and occipital EEG, electrooculogram (EOG), submentalis EMG (chin), nasal and oral airflow, thoracic and abdominal wall motion, anterior tibialis EMG, snore microphone, electrocardiogram, and pulse oximetry. Continuous positive airway pressure (CPAP) was initiated at the beginning of the study and titrated to treat sleep-disordered breathing. MEDICATIONS Medications self-administered by patient taken the night of the study : CARVEDILOL, CRESTOR, LORAZEPAM TECHNICIAN COMMENTS Comments added by technician: Patient had difficulty initiating sleep. Patient was restless at times prior to maintaining sleep, latter part of study. TRT increased due to patients'  inability to obtain and maintain sleep, until latter part of study. Patient did not meet lab protocol for O2 supplement. Suboptimal pressure obtained due to REM- supine stage was not observed. CPAP therapy started at 4 CWP and increased to 6 CWP due to events and snoring episodes. Patient used 2 pillows to prop-up due to GERD. PLMS noted during first part of study. Patient tolerated CPAP titration very well Comments added by scorer: N/A RESPIRATORY PARAMETERS Optimal PAP Pressure (cm): AHI at Optimal Pressure (/hr): N/A Overall Minimal O2 (%): 86.00 Supine % at Optimal Pressure (%): N/A Minimal O2 at Optimal Pressure (%): 84.00 SLEEP ARCHITECTURE The study was initiated at 10:40:46 PM and ended at 6:13:20 AM. Sleep onset time was 151.2 minutes and the sleep efficiency was 28.4%. The total sleep time was 128.3 minutes. The patient spent 7.40% of the night in stage N1 sleep, 39.74% in stage N2 sleep, 52.86% in stage N3 and 0% in REM.Stage REM latency was N/A minutes Wake after sleep onset was 173.0. Alpha intrusion was absent. Supine sleep was 87.14%. CARDIAC DATA The 2 lead EKG demonstrated sinus rhythm. The mean heart rate was 77.75 beats per minute. Other EKG findings include: None. LEG MOVEMENT DATA The total Periodic Limb Movements of Sleep (PLMS) were 138. The PLMS index was 64.51. A PLMS index of <15 is considered normal in adults. IMPRESSIONS - An optimal PAP pressure  could not be selected for this patient based on the available study data.CPAP 6cm seemed optimal but no REM sleep noted at this level - Moderate oxygen desaturations were observed during this titration (min O2 = 86.00%). - The patient snored with soft snoring volume during this titration study. - No cardiac abnormalities were observed during this study. - Severe periodic limb movements were observed during this study. Arousals associated with PLMs were rare. DIAGNOSIS - Obstructive Sleep Apnea (G47.33) RECOMMENDATIONS - Recommend a trial of  Auto-CPAP 6-12 cm H2O with med nasal cradle mask - Avoid alcohol, sedatives and other CNS depressants that may worsen sleep apnea and disrupt normal sleep architecture. - Sleep hygiene should be reviewed to assess factors that may improve sleep quality. - Weight management and regular exercise should be initiated or continued. - Return to [facility] for re-evaluation after 4 weeks of therapy [Electronically signed] 04/07/2023 06:02 PM Cyril Mourning MD, ABSM Diplomate, American Board of Sleep Medicine NPI: [id number]    Assessment & Plan:    1. OSA (obstructive sleep apnea) (Primary) - Ambulatory Referral for DME  2. Immunization due - Flu Vaccine Trivalent High Dose (Fluad)  OSA  She underwent CPAP titration on 03/29/23. Minimal O2 at optimal pressure was 84%. Recommend trial auto CPAP 6-12cm h20  with nasal cradle mask, needs chin strap.  Recommendations: Continue to wear CPAP every night  Take Promethazine-DM at bedtime for cough; may repeat once if needed  Do not eat large meal after 9pm  Aim to go to bed by 11pm-12am  Orders: Adjust CPAP pressure 6-12cm h20 Needs chin strap   Follow-up 6 months with Dr. Wynona Neat (30 min)  Glenford Bayley, NP 04/14/2023

## 2023-04-16 DIAGNOSIS — G4733 Obstructive sleep apnea (adult) (pediatric): Secondary | ICD-10-CM | POA: Diagnosis not present

## 2023-04-28 DIAGNOSIS — E211 Secondary hyperparathyroidism, not elsewhere classified: Secondary | ICD-10-CM | POA: Diagnosis not present

## 2023-04-28 DIAGNOSIS — R809 Proteinuria, unspecified: Secondary | ICD-10-CM | POA: Diagnosis not present

## 2023-04-28 DIAGNOSIS — I5032 Chronic diastolic (congestive) heart failure: Secondary | ICD-10-CM | POA: Diagnosis not present

## 2023-04-28 DIAGNOSIS — N189 Chronic kidney disease, unspecified: Secondary | ICD-10-CM | POA: Diagnosis not present

## 2023-04-28 DIAGNOSIS — D631 Anemia in chronic kidney disease: Secondary | ICD-10-CM | POA: Diagnosis not present

## 2023-05-04 DIAGNOSIS — I129 Hypertensive chronic kidney disease with stage 1 through stage 4 chronic kidney disease, or unspecified chronic kidney disease: Secondary | ICD-10-CM | POA: Diagnosis not present

## 2023-05-04 DIAGNOSIS — N1832 Chronic kidney disease, stage 3b: Secondary | ICD-10-CM | POA: Diagnosis not present

## 2023-05-04 DIAGNOSIS — G4733 Obstructive sleep apnea (adult) (pediatric): Secondary | ICD-10-CM | POA: Diagnosis not present

## 2023-05-04 DIAGNOSIS — I5032 Chronic diastolic (congestive) heart failure: Secondary | ICD-10-CM | POA: Diagnosis not present

## 2023-05-05 ENCOUNTER — Other Ambulatory Visit: Payer: Self-pay | Admitting: Physician Assistant

## 2023-05-12 DIAGNOSIS — G4733 Obstructive sleep apnea (adult) (pediatric): Secondary | ICD-10-CM | POA: Diagnosis not present

## 2023-06-11 DIAGNOSIS — G4733 Obstructive sleep apnea (adult) (pediatric): Secondary | ICD-10-CM | POA: Diagnosis not present

## 2023-06-23 DIAGNOSIS — G4733 Obstructive sleep apnea (adult) (pediatric): Secondary | ICD-10-CM | POA: Diagnosis not present

## 2023-06-29 ENCOUNTER — Observation Stay (HOSPITAL_COMMUNITY): Payer: Medicare PPO

## 2023-06-29 ENCOUNTER — Other Ambulatory Visit: Payer: Self-pay

## 2023-06-29 ENCOUNTER — Emergency Department (HOSPITAL_COMMUNITY): Payer: Medicare PPO

## 2023-06-29 ENCOUNTER — Inpatient Hospital Stay (HOSPITAL_COMMUNITY)
Admission: EM | Admit: 2023-06-29 | Discharge: 2023-07-01 | DRG: 194 | Disposition: A | Discharge status: Home / Self Care | Payer: Medicare PPO | Attending: Internal Medicine | Admitting: Internal Medicine

## 2023-06-29 DIAGNOSIS — N1831 Chronic kidney disease, stage 3a: Secondary | ICD-10-CM | POA: Diagnosis present

## 2023-06-29 DIAGNOSIS — J189 Pneumonia, unspecified organism: Secondary | ICD-10-CM | POA: Diagnosis present

## 2023-06-29 DIAGNOSIS — G4733 Obstructive sleep apnea (adult) (pediatric): Secondary | ICD-10-CM | POA: Diagnosis present

## 2023-06-29 DIAGNOSIS — Z888 Allergy status to other drugs, medicaments and biological substances status: Secondary | ICD-10-CM

## 2023-06-29 DIAGNOSIS — Z880 Allergy status to penicillin: Secondary | ICD-10-CM | POA: Diagnosis not present

## 2023-06-29 DIAGNOSIS — R0989 Other specified symptoms and signs involving the circulatory and respiratory systems: Secondary | ICD-10-CM | POA: Diagnosis not present

## 2023-06-29 DIAGNOSIS — Z79899 Other long term (current) drug therapy: Secondary | ICD-10-CM | POA: Diagnosis not present

## 2023-06-29 DIAGNOSIS — Z800 Family history of malignant neoplasm of digestive organs: Secondary | ICD-10-CM

## 2023-06-29 DIAGNOSIS — J1289 Other viral pneumonia: Principal | ICD-10-CM | POA: Diagnosis present

## 2023-06-29 DIAGNOSIS — R509 Fever, unspecified: Secondary | ICD-10-CM | POA: Diagnosis not present

## 2023-06-29 DIAGNOSIS — Z9221 Personal history of antineoplastic chemotherapy: Secondary | ICD-10-CM

## 2023-06-29 DIAGNOSIS — I129 Hypertensive chronic kidney disease with stage 1 through stage 4 chronic kidney disease, or unspecified chronic kidney disease: Secondary | ICD-10-CM | POA: Diagnosis present

## 2023-06-29 DIAGNOSIS — R6510 Systemic inflammatory response syndrome (SIRS) of non-infectious origin without acute organ dysfunction: Secondary | ICD-10-CM | POA: Diagnosis present

## 2023-06-29 DIAGNOSIS — R059 Cough, unspecified: Secondary | ICD-10-CM | POA: Diagnosis not present

## 2023-06-29 DIAGNOSIS — Z8249 Family history of ischemic heart disease and other diseases of the circulatory system: Secondary | ICD-10-CM

## 2023-06-29 DIAGNOSIS — I2510 Atherosclerotic heart disease of native coronary artery without angina pectoris: Secondary | ICD-10-CM | POA: Diagnosis present

## 2023-06-29 DIAGNOSIS — I10 Essential (primary) hypertension: Secondary | ICD-10-CM | POA: Diagnosis not present

## 2023-06-29 DIAGNOSIS — Z1152 Encounter for screening for COVID-19: Secondary | ICD-10-CM

## 2023-06-29 DIAGNOSIS — J45909 Unspecified asthma, uncomplicated: Secondary | ICD-10-CM | POA: Diagnosis present

## 2023-06-29 DIAGNOSIS — B9789 Other viral agents as the cause of diseases classified elsewhere: Secondary | ICD-10-CM | POA: Diagnosis present

## 2023-06-29 DIAGNOSIS — Z885 Allergy status to narcotic agent status: Secondary | ICD-10-CM

## 2023-06-29 DIAGNOSIS — R9431 Abnormal electrocardiogram [ECG] [EKG]: Secondary | ICD-10-CM | POA: Diagnosis present

## 2023-06-29 DIAGNOSIS — M810 Age-related osteoporosis without current pathological fracture: Secondary | ICD-10-CM | POA: Diagnosis present

## 2023-06-29 DIAGNOSIS — Z79890 Hormone replacement therapy (postmenopausal): Secondary | ICD-10-CM | POA: Diagnosis not present

## 2023-06-29 DIAGNOSIS — E039 Hypothyroidism, unspecified: Secondary | ICD-10-CM | POA: Diagnosis present

## 2023-06-29 DIAGNOSIS — E785 Hyperlipidemia, unspecified: Secondary | ICD-10-CM | POA: Diagnosis present

## 2023-06-29 DIAGNOSIS — E876 Hypokalemia: Secondary | ICD-10-CM | POA: Diagnosis present

## 2023-06-29 DIAGNOSIS — R000 Tachycardia, unspecified: Secondary | ICD-10-CM | POA: Diagnosis not present

## 2023-06-29 DIAGNOSIS — R0902 Hypoxemia: Secondary | ICD-10-CM | POA: Diagnosis present

## 2023-06-29 DIAGNOSIS — E034 Atrophy of thyroid (acquired): Secondary | ICD-10-CM | POA: Diagnosis not present

## 2023-06-29 DIAGNOSIS — R0602 Shortness of breath: Secondary | ICD-10-CM | POA: Diagnosis not present

## 2023-06-29 DIAGNOSIS — D72829 Elevated white blood cell count, unspecified: Secondary | ICD-10-CM | POA: Diagnosis not present

## 2023-06-29 DIAGNOSIS — Z823 Family history of stroke: Secondary | ICD-10-CM

## 2023-06-29 DIAGNOSIS — K219 Gastro-esophageal reflux disease without esophagitis: Secondary | ICD-10-CM | POA: Diagnosis present

## 2023-06-29 DIAGNOSIS — Z853 Personal history of malignant neoplasm of breast: Secondary | ICD-10-CM | POA: Diagnosis not present

## 2023-06-29 DIAGNOSIS — F419 Anxiety disorder, unspecified: Secondary | ICD-10-CM | POA: Diagnosis present

## 2023-06-29 DIAGNOSIS — N189 Chronic kidney disease, unspecified: Secondary | ICD-10-CM | POA: Diagnosis present

## 2023-06-29 DIAGNOSIS — Z833 Family history of diabetes mellitus: Secondary | ICD-10-CM

## 2023-06-29 LAB — COMPREHENSIVE METABOLIC PANEL
ALT: 14 U/L (ref 0–44)
AST: 20 U/L (ref 15–41)
Albumin: 3.8 g/dL (ref 3.5–5.0)
Alkaline Phosphatase: 48 U/L (ref 38–126)
Anion gap: 10 (ref 5–15)
BUN: 13 mg/dL (ref 8–23)
CO2: 26 mmol/L (ref 22–32)
Calcium: 8.7 mg/dL — ABNORMAL LOW (ref 8.9–10.3)
Chloride: 97 mmol/L — ABNORMAL LOW (ref 98–111)
Creatinine, Ser: 1.43 mg/dL — ABNORMAL HIGH (ref 0.44–1.00)
GFR, Estimated: 37 mL/min — ABNORMAL LOW (ref >60)
Glucose, Bld: 97 mg/dL (ref 70–99)
Potassium: 3.3 mmol/L — ABNORMAL LOW (ref 3.5–5.1)
Sodium: 133 mmol/L — ABNORMAL LOW (ref 135–145)
Total Bilirubin: 0.6 mg/dL (ref <1.2)
Total Protein: 6.7 g/dL (ref 6.5–8.1)

## 2023-06-29 LAB — CBC WITH DIFFERENTIAL/PLATELET
Abs Immature Granulocytes: 0.05 10*3/uL (ref 0.00–0.07)
Basophils Absolute: 0 10*3/uL (ref 0.0–0.1)
Basophils Relative: 1 %
Eosinophils Absolute: 0.3 10*3/uL (ref 0.0–0.5)
Eosinophils Relative: 4 %
HCT: 34.6 % — ABNORMAL LOW (ref 36.0–46.0)
Hemoglobin: 11.2 g/dL — ABNORMAL LOW (ref 12.0–15.0)
Immature Granulocytes: 1 %
Lymphocytes Relative: 9 %
Lymphs Abs: 0.8 10*3/uL (ref 0.7–4.0)
MCH: 28.4 pg (ref 26.0–34.0)
MCHC: 32.4 g/dL (ref 30.0–36.0)
MCV: 87.8 fL (ref 80.0–100.0)
Monocytes Absolute: 1.1 10*3/uL — ABNORMAL HIGH (ref 0.1–1.0)
Monocytes Relative: 13 %
Neutro Abs: 5.8 10*3/uL (ref 1.7–7.7)
Neutrophils Relative %: 72 %
Platelets: 241 10*3/uL (ref 150–400)
RBC: 3.94 MIL/uL (ref 3.87–5.11)
RDW: 14.8 % (ref 11.5–15.5)
WBC: 8 10*3/uL (ref 4.0–10.5)
nRBC: 0 % (ref 0.0–0.2)

## 2023-06-29 LAB — URINALYSIS, W/ REFLEX TO CULTURE (INFECTION SUSPECTED)
Bacteria, UA: NONE SEEN
Bilirubin Urine: NEGATIVE
Glucose, UA: NEGATIVE mg/dL
Ketones, ur: NEGATIVE mg/dL
Nitrite: NEGATIVE
Protein, ur: NEGATIVE mg/dL
Specific Gravity, Urine: 1.006 (ref 1.005–1.030)
pH: 6 (ref 5.0–8.0)

## 2023-06-29 LAB — PROTIME-INR
INR: 1 (ref 0.8–1.2)
Prothrombin Time: 13.4 s (ref 11.4–15.2)

## 2023-06-29 LAB — APTT: aPTT: 27 s (ref 24–36)

## 2023-06-29 LAB — MAGNESIUM: Magnesium: 2.3 mg/dL (ref 1.7–2.4)

## 2023-06-29 LAB — RESP PANEL BY RT-PCR (RSV, FLU A&B, COVID)  RVPGX2
Influenza A by PCR: NEGATIVE
Influenza B by PCR: NEGATIVE
Resp Syncytial Virus by PCR: NEGATIVE
SARS Coronavirus 2 by RT PCR: NEGATIVE

## 2023-06-29 LAB — LACTIC ACID, PLASMA: Lactic Acid, Venous: 1.3 mmol/L (ref 0.5–1.9)

## 2023-06-29 MED ORDER — LEVOTHYROXINE SODIUM 50 MCG PO TABS
50.0000 ug | ORAL_TABLET | Freq: Every day | ORAL | Status: DC
  Administered 2023-06-30 – 2023-07-01 (×2): 50 ug via ORAL
  Filled 2023-06-29 (×2): qty 1

## 2023-06-29 MED ORDER — POTASSIUM CHLORIDE IN NACL 20-0.9 MEQ/L-% IV SOLN: INTRAVENOUS | Status: AC

## 2023-06-29 MED ORDER — MUPIROCIN 2 % EX OINT
1.0000 | TOPICAL_OINTMENT | Freq: Two times a day (BID) | CUTANEOUS | Status: DC
  Administered 2023-06-30 – 2023-07-01 (×2): 1 via NASAL

## 2023-06-29 MED ORDER — DOXYCYCLINE HYCLATE 100 MG IV SOLR
100.0000 mg | Freq: Two times a day (BID) | INTRAVENOUS | Status: DC
  Administered 2023-06-30 – 2023-07-01 (×2): 100 mg via INTRAVENOUS
  Filled 2023-06-29 (×5): qty 100

## 2023-06-29 MED ORDER — SODIUM CHLORIDE 0.9 % IV BOLUS
1000.0000 mL | Freq: Once | INTRAVENOUS | Status: AC
  Administered 2023-06-29: 1000 mL via INTRAVENOUS

## 2023-06-29 MED ORDER — ACETAMINOPHEN 325 MG PO TABS
650.0000 mg | ORAL_TABLET | Freq: Four times a day (QID) | ORAL | Status: DC | PRN
  Administered 2023-06-30 (×2): 650 mg via ORAL
  Filled 2023-06-29 (×2): qty 2

## 2023-06-29 MED ORDER — GUAIFENESIN-DM 100-10 MG/5ML PO SYRP
15.0000 mL | ORAL_SOLUTION | Freq: Three times a day (TID) | ORAL | Status: AC
  Administered 2023-06-29 – 2023-06-30 (×3): 15 mL via ORAL
  Filled 2023-06-29 (×3): qty 15

## 2023-06-29 MED ORDER — ACETAMINOPHEN 500 MG PO TABS
ORAL_TABLET | ORAL | Status: AC
  Filled 2023-06-29: qty 2

## 2023-06-29 MED ORDER — PROMETHAZINE HCL 12.5 MG PO TABS: 12.5000 mg | ORAL_TABLET | Freq: Four times a day (QID) | ORAL | Status: DC | PRN

## 2023-06-29 MED ORDER — POLYETHYLENE GLYCOL 3350 17 G PO PACK: 17.0000 g | PACK | Freq: Every day | ORAL | Status: DC | PRN

## 2023-06-29 MED ORDER — ACETAMINOPHEN 500 MG PO TABS
1000.0000 mg | ORAL_TABLET | Freq: Once | ORAL | Status: AC
  Administered 2023-06-29: 1000 mg via ORAL

## 2023-06-29 MED ORDER — IBUPROFEN 600 MG PO TABS
600.0000 mg | ORAL_TABLET | Freq: Once | ORAL | Status: AC
  Administered 2023-06-29: 600 mg via ORAL
  Filled 2023-06-29: qty 1

## 2023-06-29 MED ORDER — AMLODIPINE BESYLATE 5 MG PO TABS
5.0000 mg | ORAL_TABLET | Freq: Every day | ORAL | Status: DC
Start: 2023-06-30 — End: 2023-07-01
  Administered 2023-06-30 – 2023-07-01 (×2): 5 mg via ORAL
  Filled 2023-06-29 (×2): qty 1

## 2023-06-29 MED ORDER — ACETAMINOPHEN 650 MG RE SUPP: 650.0000 mg | Freq: Four times a day (QID) | RECTAL | Status: DC | PRN

## 2023-06-29 MED ORDER — HEPARIN SODIUM (PORCINE) 5000 UNIT/ML IJ SOLN
5000.0000 [IU] | Freq: Three times a day (TID) | INTRAMUSCULAR | Status: DC
  Administered 2023-06-29 – 2023-07-01 (×5): 5000 [IU] via SUBCUTANEOUS
  Filled 2023-06-29 (×5): qty 1

## 2023-06-29 MED ORDER — ALBUTEROL SULFATE (2.5 MG/3ML) 0.083% IN NEBU: 2.5000 mg | INHALATION_SOLUTION | RESPIRATORY_TRACT | Status: DC | PRN

## 2023-06-29 MED ORDER — SODIUM CHLORIDE 0.9 % IV SOLN
500.0000 mg | INTRAVENOUS | Status: DC
  Administered 2023-06-29: 500 mg via INTRAVENOUS
  Filled 2023-06-29: qty 5

## 2023-06-29 MED ORDER — LORAZEPAM 1 MG PO TABS
1.0000 mg | ORAL_TABLET | Freq: Every day | ORAL | Status: DC
  Administered 2023-06-29 – 2023-06-30 (×2): 1 mg via ORAL
  Filled 2023-06-29 (×2): qty 1

## 2023-06-29 MED ORDER — CARVEDILOL 3.125 MG PO TABS
6.2500 mg | ORAL_TABLET | Freq: Two times a day (BID) | ORAL | Status: DC
  Administered 2023-06-30 – 2023-07-01 (×3): 6.25 mg via ORAL
  Filled 2023-06-29 (×3): qty 2

## 2023-06-29 MED ORDER — PANTOPRAZOLE SODIUM 40 MG PO TBEC
40.0000 mg | DELAYED_RELEASE_TABLET | Freq: Every day | ORAL | Status: DC
Start: 2023-06-30 — End: 2023-07-01
  Administered 2023-06-30 – 2023-07-01 (×2): 40 mg via ORAL
  Filled 2023-06-29 (×2): qty 1

## 2023-06-29 MED ORDER — SODIUM CHLORIDE 0.9 % IV SOLN
2.0000 g | INTRAVENOUS | Status: DC
  Administered 2023-06-29 – 2023-06-30 (×2): 2 g via INTRAVENOUS
  Filled 2023-06-29 (×2): qty 20

## 2023-06-29 NOTE — H&P (Signed)
History and Physical    JAY WENDORFF ZOX:[id number] DOB: 05-Feb-1941 DOA: 06/29/2023  PCP: Elfredia Nevins, MD   Patient coming from: Home  I have personally briefly reviewed patient's old medical records in [facility]  Chief Complaint: Cough, fever  HPI: Jane Wilkerson is a 82 y.o. female with medical history significant for coronary artery disease, hypertension, OSA. Patient presented to the ED with complaints of headache, dry cough, runny nose and fever.  She reports difficulty breathing with exertion.  Symptoms started with cough about a few weeks ago.  She felt achy like she had a viral infection.  No chest pain.  No lower extremity swelling.  No vomiting no loose stools.  ED Course: Tmax 101.  Heart rate 88-95.  Respiratory 20.  Blood pressure systolic 160 122.  O2 sats 92% on room air placed on 2 L. WBC 8.  Lactic acid 1.3. Chest x-ray without acute acute abnormality. Blood cultures obtained. IV Ceftriaxone and azithromycin given.  1 L bolus given.  Review of Systems: As per HPI all other systems reviewed and negative.  Past Medical History:  Diagnosis Date   Allergy    Anxiety 07/07/2011   takes Ativan every 4hrs   Arthritis    back    Asthma    uses inhaler prn   Breast cancer (HCC)    right - local excision, chemotheraphy   Cataract    right eye   Chronic back pain    Depression    has appt to discuss depression 07/26/2011   Diverticulosis    Dizziness    occasionally   Gastric ulcer    GERD (gastroesophageal reflux disease)    takes Nexium daily   Hemorrhoids    History of nuclear stress test 11/2010   lexiscan; no evidence of inducible ischemia; normal pattern of perfusion    History of shingles 2011   Hx of colonic polyps    Hyperlipidemia    takes Zocor daily   Hypertension    takes Bystolic and Losartan daily   Hypothyroidism    takes Synthroid daily   Nausea alone    onset last Wednesday  08/04/11   Nocturia    Osteoporosis    PONV  (postoperative nausea and vomiting)    PVC's (premature ventricular contractions)    Shortness of breath    with exertion   Skin rash    Urinary frequency    Urticaria of unknown origin     Past Surgical History:  Procedure Laterality Date   ABDOMINAL HYSTERECTOMY  2001   BREAST LUMPECTOMY Right 07/02/11   right breast and SNL   BREAST SURGERY Left 1972   left lumpectomy   BREAST SURGERY Right 08/11/11   margins on rt breast   CATARACT EXTRACTION W/PHACO Right 08/31/2016   Procedure: CATARACT EXTRACTION PHACO AND INTRAOCULAR LENS PLACEMENT (IOC);  Surgeon: Jethro Bolus, MD;  Location: [facility];  Service: Ophthalmology;  Laterality: Right;  CDE: 8.46   CATARACT EXTRACTION W/PHACO Left 09/14/2016   Procedure: CATARACT EXTRACTION PHACO AND INTRAOCULAR LENS PLACEMENT (IOC);  Surgeon: Jethro Bolus, MD;  Location: [facility];  Service: Ophthalmology;  Laterality: Left;  CDE: 9.32   COLONOSCOPY     colonscopy     HEMORRHOID SURGERY     LEFT HEART CATH AND CORONARY ANGIOGRAPHY N/A 12/17/2021   Procedure: LEFT HEART CATH AND CORONARY ANGIOGRAPHY;  Surgeon: Lennette Bihari, MD;  Location: [facility];  Service: Cardiovascular;  Laterality: N/A;  TRANSTHORACIC ECHOCARDIOGRAM  2009   normal LV & RV systolic function; mild mitral annular calcif & mild MR; trace TR     reports that she has never smoked. She has been exposed to tobacco smoke. She has never used smokeless tobacco. She reports that she does not drink alcohol and does not use drugs.  Allergies  Allergen Reactions   Bisoprolol-Hydrochlorothiazide Other (See Comments)    Unknown   Hydrocodone Other (See Comments)    Unknown   Lipitor [Atorvastatin] Itching   Lisinopril Itching   Oxycodone Itching   Oxycodone Hcl Itching   Ziac [Bisoprolol-Hydrochlorothiazide] Other (See Comments)    Unknown Patient taking hydrochlorothiazide with no reaction.   Penicillins Rash         Family History  Problem Relation Age of Onset    Colon cancer Mother        also stroke   Pancreatic cancer Father        also heart disease   Diabetes Sister    Hypertension Sister    Anesthesia problems Neg Hx    Hypotension Neg Hx    Malignant hyperthermia Neg Hx    Pseudochol deficiency Neg Hx    Stomach cancer Neg Hx    Rectal cancer Neg Hx     Prior to Admission medications   Medication Sig Start Date End Date Taking? Authorizing Provider  amLODipine (NORVASC) 5 MG tablet TAKE 1 TABLET(5 MG) BY MOUTH DAILY 10/29/22   Hilty, Lisette Abu, MD  carvedilol (COREG) 12.5 MG tablet Take 1 tablet (12.5 mg total) by mouth 2 (two) times daily. Patient taking differently: Take 6.25 mg by mouth 2 (two) times daily. 04/05/22 03/31/23  Dyann Kief, PA-C  carvedilol (COREG) 6.25 MG tablet Take 6.25 mg by mouth 2 (two) times daily with a meal.    [provider]  citalopram (CELEXA) 20 MG tablet Take 20 mg by mouth daily.    [provider]  hydrocortisone cream 0.5 % Apply 1 application topically 2 (two) times daily as needed for itching. For hemorrhoid pain    [provider]  levothyroxine (SYNTHROID, LEVOTHROID) 50 MCG tablet Take 50 mcg by mouth daily.    [provider]  LORazepam (ATIVAN) 1 MG tablet Take 1 mg by mouth at bedtime. For anxiety may take extra tablet if needed    [provider]  nitroGLYCERIN (NITROSTAT) 0.4 MG SL tablet Place 1 tablet (0.4 mg total) under the tongue every 5 (five) minutes as needed for chest pain. 12/17/21   Azucena Fallen, MD  omeprazole (PRILOSEC) 40 MG capsule TAKE 1 CAPSULE BY MOUTH SHORTLY BEFORE BREAKFAST DAILY. Patient taking differently: Take 40 mg by mouth daily. TAKE 1 CAPSULE BY MOUTH SHORTLY BEFORE BREAKFAST DAILY. 11/16/21   Rachael Fee, MD  promethazine-dextromethorphan (PROMETHAZINE-DM) 6.25-15 MG/5ML syrup Take 2.5 mLs by mouth at bedtime and may repeat dose one time if needed. 04/14/23   Glenford Bayley, NP  psyllium (METAMUCIL) 58.6 %  powder Take 1 packet by mouth daily as needed (constipation).    [provider]  rosuvastatin (CRESTOR) 40 MG tablet TAKE 1 TABLET(40 MG) BY MOUTH DAILY 05/05/23   Dyann Kief, PA-C  Spacer/Aero-Holding Chambers (AEROCHAMBER PLUS) inhaler Use with inhaler 04/17/21   Domenick Gong, MD  triamcinolone (KENALOG) 0.1 % Apply 1 application topically daily. 08/19/20   [provider]    Physical Exam: Vitals:   06/29/23 1455 06/29/23 1515 06/29/23 1530 06/29/23 1710  BP:  103/67  106/60 114/66  Pulse: 88 90 88 90  Resp: 20  20 20   Temp:   100.1 F (37.8 C) (!) 101 F (38.3 C)  TempSrc:   Oral Oral  SpO2: 97% 96% 96% 98%  Weight:      Height:        Constitutional: NAD, calm, comfortable Vitals:   06/29/23 1455 06/29/23 1515 06/29/23 1530 06/29/23 1710  BP:  103/67 106/60 114/66  Pulse: 88 90 88 90  Resp: 20  20 20   Temp:   100.1 F (37.8 C) (!) 101 F (38.3 C)  TempSrc:   Oral Oral  SpO2: 97% 96% 96% 98%  Weight:      Height:       Eyes: PERRL, lids and conjunctivae normal ENMT: Mucous membranes are moist.  Neck: normal, supple, no masses, no thyromegaly Respiratory: Faint expiratory crackles bilateral lung bases, no wheeze. Normal respiratory effort. No accessory muscle use.  Cardiovascular: Regular rate and rhythm, no murmurs / rubs / gallops. No extremity edema.  Extremities warm.   Abdomen: no tenderness, no masses palpated. No hepatosplenomegaly. Bowel sounds positive.  Musculoskeletal: no clubbing / cyanosis. No joint deformity upper and lower extremities.  Skin: no rashes, lesions, ulcers. No induration Neurologic: No facial symmetry, moving extremities spontaneously.Marland Kitchen  Psychiatric: Normal judgment and insight. Alert and oriented x 3. Normal mood.   Labs on Admission: I have personally reviewed following labs and imaging studies  CBC: Recent Labs  Lab 06/29/23 1425  WBC 8.0  NEUTROABS 5.8  HGB 11.2*  HCT 34.6*  MCV 87.8  PLT 241    Basic Metabolic Panel: Recent Labs  Lab 06/29/23 1425  NA 133*  K 3.3*  CL 97*  CO2 26  GLUCOSE 97  BUN 13  CREATININE 1.43*  CALCIUM 8.7*   GFR: Estimated Creatinine Clearance: 28.8 mL/min (A) (by C-G formula based on SCr of 1.43 mg/dL (H)). Liver Function Tests: Recent Labs  Lab 06/29/23 1425  AST 20  ALT 14  ALKPHOS 48  BILITOT 0.6  PROT 6.7  ALBUMIN 3.8   Coagulation Profile: Recent Labs  Lab 06/29/23 1425  INR 1.0   BNP (last 3 results) Recent Labs    01/12/23 1224  PROBNP 89.0   Urine analysis:    Component Value Date/Time   COLORURINE STRAW (A) 06/29/2023 1449   APPEARANCEUR CLEAR 06/29/2023 1449   LABSPEC 1.006 06/29/2023 1449   PHURINE 6.0 06/29/2023 1449   GLUCOSEU NEGATIVE 06/29/2023 1449   HGBUR SMALL (A) 06/29/2023 1449   BILIRUBINUR NEGATIVE 06/29/2023 1449   KETONESUR NEGATIVE 06/29/2023 1449   PROTEINUR NEGATIVE 06/29/2023 1449   NITRITE NEGATIVE 06/29/2023 1449   LEUKOCYTESUR MODERATE (A) 06/29/2023 1449    Radiological Exams on Admission: DG Chest Port 1 View Result Date: 06/29/2023 CLINICAL DATA:  Cough and fever for 1 week.  Possible sepsis. EXAM: PORTABLE CHEST 1 VIEW COMPARISON:  01/12/2023 FINDINGS: The heart size and mediastinal contours are within normal limits. Low lung volumes again noted. Both lungs are clear. The visualized skeletal structures are unremarkable. IMPRESSION: No active disease. Electronically Signed   By: Danae Orleans M.D.   On: 06/29/2023 14:38    EKG: Independently reviewed.  Sinus rhythm, 91, QTc 525.  No significant change from prior.  Assessment/Plan Principal Problem:   SIRS (systemic inflammatory response syndrome) (HCC) Active Problems:   Hypothyroidism   Essential hypertension   CAD (coronary artery disease)   OSA (obstructive sleep apnea)   Chronic kidney disease  Assessment  and Plan: * SIRS (systemic inflammatory response syndrome) (HCC) Presenting respiratory symptoms of cough,  congestion, dyspnea on exertion.  Also with fever.  Chest x-ray negative for acute abnormality.  Meeting SIRS criteria with fever of 101, tachycardia heart rate 88-95.  Tachypnea respirate rate of 20.  Normal lactic acid 1.3.  WBC 8.  O2 sats 92% on room air.  Non-smoker.   - COVID/Flu/RSV test negative.  UA with positive leukocytes. -IV ceftriaxone and azithromycin started , QTc prolonged, continue with ceftriaxone and doxycycline -Follow-up blood cultures -Add on urine cultures -1 L bolus given, continue N/s + 20 kcl 75cc/hr x 12hrs - PRN Nebs, Mucolytics  Chronic kidney disease Creatinine 1.4, close to baseline 1-1.2.  CKD stage IIIa/B  OSA (obstructive sleep apnea) CPAP nightly  Essential hypertension Stable. -Resume Norvasc, carvedilol in a.m.  Hypothyroidism Resume Synthroid  Hypokalemia- Mild. potassium 3.3.  QT prolonged 525.  Check magnesium.  DVT prophylaxis: heparin Code Status: FULL Family Communication: Daughter and spouse at bedside Disposition Plan: ~ 2 days Consults called:  None  Admission status: Inpt Tele  I certify that at the point of admission it is my clinical judgment that the patient will require inpatient hospital care spanning beyond 2 midnights from the point of admission due to high intensity of service, high risk for further deterioration and high frequency of surveillance required.    Author: Onnie Boer, MD 06/29/2023 5:34 PM  For on call review [email or web address].

## 2023-06-29 NOTE — Assessment & Plan Note (Signed)
Resume Synthroid ?

## 2023-06-29 NOTE — ED Triage Notes (Signed)
Pt having headache, cough, runny nose, and fever for about a week. Last night pt had fever of 103, took tylenol. When she went to bed she began feeling worse and temp was 104. Pt is fatigued and has had limited PO intake. Pt temp in triage is 100.6 but pt took tylenol 30 minutes ago.

## 2023-06-29 NOTE — Assessment & Plan Note (Signed)
-

## 2023-06-29 NOTE — ED Notes (Signed)
Cultures obtained before antibiotics started x2

## 2023-06-29 NOTE — Assessment & Plan Note (Addendum)
Presenting respiratory symptoms of cough, congestion, dyspnea on exertion.  Also with fever.  Chest x-ray negative for acute abnormality.  Meeting SIRS criteria with fever of 101, tachycardia heart rate 88-95.  Tachypnea respirate rate of 20.  Normal lactic acid 1.3.  WBC 8.  O2 sats 92% on room air.  Non-smoker.   - COVID/Flu/RSV test negative.  UA with positive leukocytes. -IV ceftriaxone and azithromycin started , QTc prolonged, continue with ceftriaxone and doxycycline -Follow-up blood cultures -Add on urine cultures -1 L bolus given, continue N/s + 20 kcl 75cc/hr x 12hrs - PRN Nebs, Mucolytics

## 2023-06-29 NOTE — Assessment & Plan Note (Signed)
Stable. -Resume Norvasc, carvedilol in a.m.

## 2023-06-29 NOTE — ED Provider Notes (Signed)
[place] EMERGENCY DEPARTMENT AT [facility] Provider Note   CSN: [id number] Arrival date & time: 06/29/23  1314     History  Chief Complaint  Patient presents with   Fever    Jane Wilkerson is a 82 y.o. female.  Patient has a history of hypertension.  She has been coughing and having shortness of breath with fever for about 3 days.  Patient states she is getting more short of breath and when she was ambulating she was tachypneic with sats like in the high 80s.  The history is provided by the patient. No language interpreter was used.  Fever Temp source:  Subjective Severity:  Moderate Onset quality:  Sudden Timing:  Constant Progression:  Worsening Chronicity:  Recurrent Relieved by:  Nothing Worsened by:  Nothing Ineffective treatments:  None tried Associated symptoms: cough   Associated symptoms: no chest pain, no chills, no congestion, no diarrhea, no headaches and no rash        Home Medications Prior to Admission medications   Medication Sig Start Date End Date Taking? Authorizing Provider  amLODipine (NORVASC) 5 MG tablet TAKE 1 TABLET(5 MG) BY MOUTH DAILY 10/29/22   Hilty, Lisette Abu, MD  carvedilol (COREG) 12.5 MG tablet Take 1 tablet (12.5 mg total) by mouth 2 (two) times daily. Patient taking differently: Take 6.25 mg by mouth 2 (two) times daily. 04/05/22 03/31/23  Dyann Kief, PA-C  carvedilol (COREG) 6.25 MG tablet Take 6.25 mg by mouth 2 (two) times daily with a meal.    [provider]  citalopram (CELEXA) 20 MG tablet Take 20 mg by mouth daily.    [provider]  hydrocortisone cream 0.5 % Apply 1 application topically 2 (two) times daily as needed for itching. For hemorrhoid pain    [provider]  levothyroxine (SYNTHROID, LEVOTHROID) 50 MCG tablet Take 50 mcg by mouth daily.    [provider]  LORazepam (ATIVAN) 1 MG tablet Take 1 mg by mouth at bedtime. For anxiety may take extra tablet if needed     [provider]  nitroGLYCERIN (NITROSTAT) 0.4 MG SL tablet Place 1 tablet (0.4 mg total) under the tongue every 5 (five) minutes as needed for chest pain. 12/17/21   Azucena Fallen, MD  omeprazole (PRILOSEC) 40 MG capsule TAKE 1 CAPSULE BY MOUTH SHORTLY BEFORE BREAKFAST DAILY. Patient taking differently: Take 40 mg by mouth daily. TAKE 1 CAPSULE BY MOUTH SHORTLY BEFORE BREAKFAST DAILY. 11/16/21   Rachael Fee, MD  promethazine-dextromethorphan (PROMETHAZINE-DM) 6.25-15 MG/5ML syrup Take 2.5 mLs by mouth at bedtime and may repeat dose one time if needed. 04/14/23   Glenford Bayley, NP  psyllium (METAMUCIL) 58.6 % powder Take 1 packet by mouth daily as needed (constipation).    [provider]  rosuvastatin (CRESTOR) 40 MG tablet TAKE 1 TABLET(40 MG) BY MOUTH DAILY 05/05/23   Dyann Kief, PA-C  Spacer/Aero-Holding Chambers (AEROCHAMBER PLUS) inhaler Use with inhaler 04/17/21   Domenick Gong, MD  triamcinolone (KENALOG) 0.1 % Apply 1 application topically daily. 08/19/20   [provider]      Allergies    Bisoprolol-hydrochlorothiazide, Hydrocodone, Lipitor [atorvastatin], Lisinopril, Oxycodone, Oxycodone hcl, Ziac [bisoprolol-hydrochlorothiazide], and Penicillins    Review of Systems   Review of Systems  Constitutional:  Positive for fever. Negative for appetite change, chills and fatigue.  HENT:  Negative for congestion, ear discharge and sinus pressure.   Eyes:  Negative for discharge.  Respiratory:  Positive for  cough.   Cardiovascular:  Negative for chest pain.  Gastrointestinal:  Negative for abdominal pain and diarrhea.  Genitourinary:  Negative for frequency and hematuria.  Musculoskeletal:  Negative for back pain.  Skin:  Negative for rash.  Neurological:  Negative for seizures and headaches.  Psychiatric/Behavioral:  Negative for hallucinations.     Physical Exam Updated Vital Signs BP 122/72   Pulse 88   Temp (!) 100.6 F (38.1  C) (Oral)   Resp 20   Ht 5\' 2"  (1.575 m)   Wt 72.6 kg   SpO2 97%   BMI 29.26 kg/m  Physical Exam Vitals and nursing note reviewed.  Constitutional:      Appearance: She is well-developed.  HENT:     Head: Normocephalic.     Nose: Nose normal.  Eyes:     General: No scleral icterus.    Conjunctiva/sclera: Conjunctivae normal.  Neck:     Thyroid: No thyromegaly.  Cardiovascular:     Rate and Rhythm: Normal rate and regular rhythm.     Heart sounds: No murmur heard.    No friction rub. No gallop.  Pulmonary:     Breath sounds: No stridor. No wheezing or rales.  Chest:     Chest wall: No tenderness.  Abdominal:     General: There is no distension.     Tenderness: There is no abdominal tenderness. There is no rebound.  Musculoskeletal:        General: Normal range of motion.     Cervical back: Neck supple.  Lymphadenopathy:     Cervical: No cervical adenopathy.  Skin:    Findings: No erythema or rash.  Neurological:     Mental Status: She is alert and oriented to person, place, and time.     Motor: No abnormal muscle tone.     Coordination: Coordination normal.  Psychiatric:        Behavior: Behavior normal.     ED Results / Procedures / Treatments   Labs (all labs ordered are listed, but only abnormal results are displayed) Labs Reviewed  CBC WITH DIFFERENTIAL/PLATELET - Abnormal; Notable for the following components:      Result Value   Hemoglobin 11.2 (*)    HCT 34.6 (*)    Monocytes Absolute 1.1 (*)    All other components within normal limits  COMPREHENSIVE METABOLIC PANEL - Abnormal; Notable for the following components:   Sodium 133 (*)    Potassium 3.3 (*)    Chloride 97 (*)    Creatinine, Ser 1.43 (*)    Calcium 8.7 (*)    GFR, Estimated 37 (*)    All other components within normal limits  RESP PANEL BY RT-PCR (RSV, FLU A&B, COVID)  RVPGX2  CULTURE, BLOOD (ROUTINE X 2)  CULTURE, BLOOD (ROUTINE X 2)  LACTIC ACID, PLASMA  PROTIME-INR  APTT  CBC  WITH DIFFERENTIAL/PLATELET  URINALYSIS, W/ REFLEX TO CULTURE (INFECTION SUSPECTED)  LACTIC ACID, PLASMA    EKG None  Radiology DG Chest Port 1 View Result Date: 06/29/2023 CLINICAL DATA:  Cough and fever for 1 week.  Possible sepsis. EXAM: PORTABLE CHEST 1 VIEW COMPARISON:  01/12/2023 FINDINGS: The heart size and mediastinal contours are within normal limits. Low lung volumes again noted. Both lungs are clear. The visualized skeletal structures are unremarkable. IMPRESSION: No active disease. Electronically Signed   By: Danae Orleans M.D.   On: 06/29/2023 14:38    Procedures Procedures    Medications Ordered in ED Medications  cefTRIAXone (  ROCEPHIN) 2 g in sodium chloride 0.9 % 100 mL IVPB (0 g Intravenous Stopped 06/29/23 1449)  azithromycin (ZITHROMAX) 500 mg in sodium chloride 0.9 % 250 mL IVPB (500 mg Intravenous New Bag/Given 06/29/23 1448)  sodium chloride 0.9 % bolus 1,000 mL (0 mLs Intravenous Stopped 06/29/23 1449)    ED Course/ Medical Decision Making/ A&P                                 Medical Decision Making Amount and/or Complexity of Data Reviewed Labs: ordered. Radiology: ordered. ECG/medicine tests: ordered.  Risk Decision regarding hospitalization.    This patient presents to the ED for concern of shortness of breath and cough, this involves an extensive number of treatment options, and is a complaint that carries with it a high risk of complications and morbidity.  The differential diagnosis includes pneumonia, influenza, PE   Co morbidities that complicate the patient evaluation  Hypertension   Additional history obtained:  Additional history obtained from patient External records from outside source obtained and reviewed including hospital records   Lab Tests:  I Ordered, and personally interpreted labs.  The pertinent results include: Potassium 3.3   Imaging Studies ordered:  I ordered imaging studies including chest x-ray I  independently visualized and interpreted imaging which showed negative I agree with the radiologist interpretation   Cardiac Monitoring: / EKG:  The patient was maintained on a cardiac monitor.  I personally viewed and interpreted the cardiac monitored which showed an underlying rhythm of: Normal sinus rhythm   Consultations Obtained:  I requested consultation with the hospitalist,  and discussed lab and imaging findings as well as pertinent plan - they recommend: Met   Problem List / ED Course / Critical interventions / Medication management  Bronchitis and hypoxia I ordered medication including antibiotics for possible pneumonia Reevaluation of the patient after these medicines showed that the patient stayed the same I have reviewed the patients home medicines and have made adjustments as needed   Social Determinants of Health:  None   Test / Admission - Considered:  None   Patient with fever cough and hypoxia.  Most likely pneumonia or bronchitis with hypoxia.  She will be admitted to medicine        Final Clinical Impression(s) / ED Diagnoses Final diagnoses:  None    Rx / DC Orders ED Discharge Orders     None         Bethann Berkshire, MD 07/01/23 [id number]

## 2023-06-29 NOTE — Sepsis Progress Note (Signed)
[facility] will follow pt for sepsis protocol

## 2023-06-29 NOTE — Assessment & Plan Note (Signed)
Creatinine 1.4, close to baseline 1-1.2.  CKD stage IIIa/B

## 2023-06-30 DIAGNOSIS — J45909 Unspecified asthma, uncomplicated: Secondary | ICD-10-CM | POA: Diagnosis present

## 2023-06-30 DIAGNOSIS — R509 Fever, unspecified: Secondary | ICD-10-CM

## 2023-06-30 DIAGNOSIS — Z79899 Other long term (current) drug therapy: Secondary | ICD-10-CM | POA: Diagnosis not present

## 2023-06-30 DIAGNOSIS — R000 Tachycardia, unspecified: Secondary | ICD-10-CM | POA: Diagnosis not present

## 2023-06-30 DIAGNOSIS — I129 Hypertensive chronic kidney disease with stage 1 through stage 4 chronic kidney disease, or unspecified chronic kidney disease: Secondary | ICD-10-CM | POA: Diagnosis present

## 2023-06-30 DIAGNOSIS — B9789 Other viral agents as the cause of diseases classified elsewhere: Secondary | ICD-10-CM | POA: Diagnosis present

## 2023-06-30 DIAGNOSIS — R6510 Systemic inflammatory response syndrome (SIRS) of non-infectious origin without acute organ dysfunction: Secondary | ICD-10-CM

## 2023-06-30 DIAGNOSIS — D72829 Elevated white blood cell count, unspecified: Secondary | ICD-10-CM | POA: Diagnosis not present

## 2023-06-30 DIAGNOSIS — Z885 Allergy status to narcotic agent status: Secondary | ICD-10-CM | POA: Diagnosis not present

## 2023-06-30 DIAGNOSIS — N1831 Chronic kidney disease, stage 3a: Secondary | ICD-10-CM | POA: Diagnosis present

## 2023-06-30 DIAGNOSIS — E876 Hypokalemia: Secondary | ICD-10-CM | POA: Diagnosis present

## 2023-06-30 DIAGNOSIS — I2510 Atherosclerotic heart disease of native coronary artery without angina pectoris: Secondary | ICD-10-CM | POA: Diagnosis present

## 2023-06-30 DIAGNOSIS — J1289 Other viral pneumonia: Secondary | ICD-10-CM | POA: Diagnosis present

## 2023-06-30 DIAGNOSIS — K219 Gastro-esophageal reflux disease without esophagitis: Secondary | ICD-10-CM | POA: Diagnosis present

## 2023-06-30 DIAGNOSIS — Z880 Allergy status to penicillin: Secondary | ICD-10-CM | POA: Diagnosis not present

## 2023-06-30 DIAGNOSIS — Z8249 Family history of ischemic heart disease and other diseases of the circulatory system: Secondary | ICD-10-CM | POA: Diagnosis not present

## 2023-06-30 DIAGNOSIS — J189 Pneumonia, unspecified organism: Secondary | ICD-10-CM | POA: Diagnosis present

## 2023-06-30 DIAGNOSIS — Z888 Allergy status to other drugs, medicaments and biological substances status: Secondary | ICD-10-CM | POA: Diagnosis not present

## 2023-06-30 DIAGNOSIS — Z853 Personal history of malignant neoplasm of breast: Secondary | ICD-10-CM | POA: Diagnosis not present

## 2023-06-30 DIAGNOSIS — E785 Hyperlipidemia, unspecified: Secondary | ICD-10-CM | POA: Diagnosis present

## 2023-06-30 DIAGNOSIS — Z1152 Encounter for screening for COVID-19: Secondary | ICD-10-CM | POA: Diagnosis not present

## 2023-06-30 DIAGNOSIS — R9431 Abnormal electrocardiogram [ECG] [EKG]: Secondary | ICD-10-CM | POA: Diagnosis present

## 2023-06-30 DIAGNOSIS — E039 Hypothyroidism, unspecified: Secondary | ICD-10-CM | POA: Diagnosis present

## 2023-06-30 DIAGNOSIS — F419 Anxiety disorder, unspecified: Secondary | ICD-10-CM | POA: Diagnosis present

## 2023-06-30 DIAGNOSIS — R0902 Hypoxemia: Secondary | ICD-10-CM | POA: Diagnosis present

## 2023-06-30 DIAGNOSIS — M810 Age-related osteoporosis without current pathological fracture: Secondary | ICD-10-CM | POA: Diagnosis present

## 2023-06-30 DIAGNOSIS — Z79890 Hormone replacement therapy (postmenopausal): Secondary | ICD-10-CM | POA: Diagnosis not present

## 2023-06-30 DIAGNOSIS — G4733 Obstructive sleep apnea (adult) (pediatric): Secondary | ICD-10-CM | POA: Diagnosis present

## 2023-06-30 LAB — RESPIRATORY PANEL BY PCR

## 2023-06-30 LAB — RETICULOCYTES
Immature Retic Fract: 16.6 % — ABNORMAL HIGH (ref 2.3–15.9)
RBC.: 3.52 MIL/uL — ABNORMAL LOW (ref 3.87–5.11)
Retic Count, Absolute: 57.6 10*3/uL (ref 19.0–186.0)
Retic Ct Pct: 1.7 % (ref 0.4–3.1)

## 2023-06-30 LAB — BASIC METABOLIC PANEL
Anion gap: 11 (ref 5–15)
BUN: 14 mg/dL (ref 8–23)
CO2: 25 mmol/L (ref 22–32)
Calcium: 8.1 mg/dL — ABNORMAL LOW (ref 8.9–10.3)
Chloride: 102 mmol/L (ref 98–111)
Creatinine, Ser: 1.18 mg/dL — ABNORMAL HIGH (ref 0.44–1.00)
GFR, Estimated: 46 mL/min — ABNORMAL LOW (ref >60)
Glucose, Bld: 88 mg/dL (ref 70–99)
Potassium: 3.2 mmol/L — ABNORMAL LOW (ref 3.5–5.1)
Sodium: 138 mmol/L (ref 135–145)

## 2023-06-30 LAB — CBC
HCT: 31.6 % — ABNORMAL LOW (ref 36.0–46.0)
Hemoglobin: 9.7 g/dL — ABNORMAL LOW (ref 12.0–15.0)
MCH: 27.2 pg (ref 26.0–34.0)
MCHC: 30.7 g/dL (ref 30.0–36.0)
MCV: 88.5 fL (ref 80.0–100.0)
Platelets: 203 10*3/uL (ref 150–400)
RBC: 3.57 MIL/uL — ABNORMAL LOW (ref 3.87–5.11)
RDW: 14.8 % (ref 11.5–15.5)
WBC: 6.3 10*3/uL (ref 4.0–10.5)
nRBC: 0 % (ref 0.0–0.2)

## 2023-06-30 LAB — IRON AND TIBC
Iron: 28 ug/dL (ref 28–170)
Saturation Ratios: 8 % — ABNORMAL LOW (ref 10.4–31.8)
TIBC: 340 ug/dL (ref 250–450)
UIBC: 312 ug/dL

## 2023-06-30 LAB — PROCALCITONIN: Procalcitonin: <0.1 ng/mL

## 2023-06-30 LAB — VITAMIN B12: Vitamin B-12: 77 pg/mL — ABNORMAL LOW (ref 180–914)

## 2023-06-30 LAB — FOLATE: Folate: 9.1 ng/mL (ref >5.9)

## 2023-06-30 LAB — FERRITIN: Ferritin: 25 ng/mL (ref 11–307)

## 2023-06-30 MED ORDER — MELATONIN 3 MG PO TABS
9.0000 mg | ORAL_TABLET | Freq: Every day | ORAL | Status: DC
Start: 2023-06-30 — End: 2023-07-01
  Administered 2023-06-30: 9 mg via ORAL
  Filled 2023-06-30: qty 3

## 2023-06-30 MED ORDER — POTASSIUM CHLORIDE CRYS ER 20 MEQ PO TBCR
40.0000 meq | EXTENDED_RELEASE_TABLET | Freq: Two times a day (BID) | ORAL | Status: AC
  Administered 2023-06-30 (×2): 40 meq via ORAL
  Filled 2023-06-30 (×2): qty 2

## 2023-06-30 NOTE — TOC CM/SW Note (Signed)
Transition of Care [facility]) - Inpatient Brief Assessment   Patient Details  Name: Jane Wilkerson MRN: [id number] Date of Birth: [birth year]  Transition of Care [facility]) CM/SW Contact:    Villa Herb, LCSWA Phone Number: 06/30/2023, 11:15 AM   Clinical Narrative: Transition of Care Department [facility]) has reviewed patient and no TOC needs have been identified at this time. We will continue to monitor patient advancement through interdisciplinary progression rounds. If new patient transition needs arise, please place a TOC consult.  Transition of Care Asessment: Insurance and Status: Insurance coverage has been reviewed Patient has primary care physician: Yes Home environment has been reviewed: From home Prior level of function:: Independent Prior/Current Home Services: No current home services Social Drivers of Health Review: SDOH reviewed no interventions necessary Readmission risk has been reviewed: Yes Transition of care needs: no transition of care needs at this time

## 2023-06-30 NOTE — Evaluation (Signed)
Physical Therapy Evaluation Patient Details Name: Jane Wilkerson MRN: [id number] DOB: 05-04-1941 Today's Date: 06/30/2023  History of Present Illness  Jane Wilkerson is a 82 y.o. female with medical history significant for coronary artery disease, hypertension, OSA.  Patient presented to the ED with complaints of headache, dry cough, runny nose and fever.  She reports difficulty breathing with exertion.  Symptoms started with cough about a few weeks ago.  She felt achy like she had a viral infection.  No chest pain.  No lower extremity swelling.  No vomiting no loose stools.   Clinical Impression  Patient functioning at baseline for functional mobility and gait with good return demonstrated for ambulating in room, hallways without loss of balance or need for an AD.  Plan:  Patient discharged from physical therapy to care of nursing for ambulation daily as tolerated for length of stay.          If plan is discharge home, recommend the following: Help with stairs or ramp for entrance   Can travel by private vehicle        Equipment Recommendations None recommended by PT  Recommendations for Other Services       Functional Status Assessment Patient has not had a recent decline in their functional status     Precautions / Restrictions Precautions Precautions: None Restrictions Weight Bearing Restrictions Per Provider Order: No      Mobility  Bed Mobility Overal bed mobility: Modified Independent                  Transfers Overall transfer level: Independent                      Ambulation/Gait Ambulation/Gait assistance: Independent Gait Distance (Feet): 200 Feet Assistive device: None Gait Pattern/deviations: WFL(Within Functional Limits) Gait velocity: normal     General Gait Details: demonstrates good return for ambulation in room, hallways without loss of balance  Stairs            Wheelchair Mobility     Tilt Bed    Modified Rankin  (Stroke Patients Only)       Balance Overall balance assessment: No apparent balance deficits (not formally assessed)                                           Pertinent Vitals/Pain Pain Assessment Pain Assessment: Faces Faces Pain Scale: Hurts a little bit Pain Location: low back when coughing Pain Descriptors / Indicators: Discomfort, Sore Pain Intervention(s): Limited activity within patient's tolerance, Monitored during session, Repositioned    Home Living Family/patient expects to be discharged to:: Private residence Living Arrangements: Spouse/significant other Available Help at Discharge: Family;Available PRN/intermittently Type of Home: House Home Access: Stairs to enter Entrance Stairs-Rails: Right;Left Entrance Stairs-Number of Steps: 5 steps in front Alternate Level Stairs-Number of Steps: 7 steps in back, 14 steps to basement Home Layout: Laundry or work area in basement;Two level Home Equipment: Grab bars - tub/shower      Prior Function Prior Level of Function : Independent/Modified Independent;Driving             Mobility Comments: Community ambulation without AD ADLs Comments: Indendent     Extremity/Trunk Assessment   Upper Extremity Assessment Upper Extremity Assessment: Overall WFL for tasks assessed    Lower Extremity Assessment Lower Extremity Assessment: Overall WFL for tasks assessed  Cervical / Trunk Assessment Cervical / Trunk Assessment: Normal  Communication   Communication Communication: No apparent difficulties  Cognition Arousal: Alert Behavior During Therapy: WFL for tasks assessed/performed Overall Cognitive Status: Within Functional Limits for tasks assessed                                          General Comments      Exercises     Assessment/Plan    PT Assessment Patient does not need any further PT services  PT Problem List         PT Treatment Interventions      PT  Goals (Current goals can be found in the Care Plan section)  Acute Rehab PT Goals Patient Stated Goal: return home PT Goal Formulation: With patient Time For Goal Achievement: 06/30/23 Potential to Achieve Goals: Good    Frequency       Co-evaluation               AM-PAC PT "6 Clicks" Mobility  Outcome Measure Help needed turning from your back to your side while in a flat bed without using bedrails?: None Help needed moving from lying on your back to sitting on the side of a flat bed without using bedrails?: None Help needed moving to and from a bed to a chair (including a wheelchair)?: None Help needed standing up from a chair using your arms (e.g., wheelchair or bedside chair)?: None Help needed to walk in hospital room?: None Help needed climbing 3-5 steps with a railing? : A Little 6 Click Score: 23    End of Session   Activity Tolerance: Patient tolerated treatment well Patient left: in chair;with call bell/phone within reach Nurse Communication: Mobility status PT Visit Diagnosis: Unsteadiness on feet (R26.81);Other abnormalities of gait and mobility (R26.89);Muscle weakness (generalized) (M62.81)    Time: 2595-6387 PT Time Calculation (min) (ACUTE ONLY): 16 min   Charges:   PT Evaluation $PT Eval Low Complexity: 1 Low PT Treatments $Therapeutic Activity: 8-22 mins PT General Charges $$ ACUTE PT VISIT: 1 Visit         3:01 PM, 06/30/23 Ocie Bob, MPT Physical Therapist with [facility] [phone] office [phone] mobile phone

## 2023-06-30 NOTE — Progress Notes (Addendum)
PROGRESS NOTE    Jane Wilkerson  ZOX:[id number] DOB: 1941-05-23 DOA: 06/29/2023 PCP: Elfredia Nevins, MD   Brief Narrative:    Jane Wilkerson is a 82 y.o. female with medical history significant for coronary artery disease, hypertension, OSA. Patient presented to the ED with complaints of headache, dry cough, runny nose and fever.  She reports difficulty breathing with exertion.  Patient admitted with SIRS criteria in the setting of respiratory infection and is also noted to have signs of UTI and has been started on empiric IV antibiotics.  Assessment & Plan:   Principal Problem:   SIRS (systemic inflammatory response syndrome) (HCC) Active Problems:   Hypothyroidism   Essential hypertension   CAD (coronary artery disease)   OSA (obstructive sleep apnea)   Chronic kidney disease  Assessment and Plan:  SIRS (systemic inflammatory response syndrome) (HCC) Presenting respiratory symptoms of cough, congestion, dyspnea on exertion.  Also with fever.  Chest x-ray negative for acute abnormality.  Meeting SIRS criteria with fever of 101, tachycardia heart rate 88-95.  Tachypnea respirate rate of 20.  Normal lactic acid 1.3.  WBC 8.  O2 sats 92% on room air.  Non-smoker.   - COVID/Flu/RSV test negative.  UA with positive leukocytes. -IV ceftriaxone and azithromycin started , QTc prolonged, continue with ceftriaxone and doxycycline -Follow-up blood cultures with no acute growth noted thus far -Add on urine cultures pending -1 L bolus given, continue N/s + 20 kcl 75cc/hr x 12hrs - PRN Nebs, Mucolytics -Check procalcitonin  Hypokalemia Replete and reevaluate in a.m.   Chronic kidney disease Creatinine 1.4, close to baseline 1-1.2.  CKD stage IIIa/B   OSA (obstructive sleep apnea) CPAP nightly   Essential hypertension-stable Stable. -Continue Norvasc and carvedilol   Hypothyroidism Resume Synthroid   Hypokalemia- Mild. potassium 3.3.  QT prolonged 525.  Check magnesium.   DVT  prophylaxis:Heparin Code Status: Full Family Communication: None at bedside Disposition Plan:  Status is: Observation The patient will require care spanning > 2 midnights and should be moved to inpatient because: Need for IV medications.   Consultants:  None  Procedures:  None  Antimicrobials:  Anti-infectives (From admission, onward)    Start     Dose/Rate Route Frequency Ordered Stop   06/30/23 1400  doxycycline (VIBRAMYCIN) 100 mg in dextrose 5 % 250 mL IVPB        100 mg 125 mL/hr over 120 Minutes Intravenous Every 12 hours 06/29/23 1734     06/29/23 1400  cefTRIAXone (ROCEPHIN) 2 g in sodium chloride 0.9 % 100 mL IVPB        2 g 200 mL/hr over 30 Minutes Intravenous Every 24 hours 06/29/23 1352 07/04/23 1359   06/29/23 1400  azithromycin (ZITHROMAX) 500 mg in sodium chloride 0.9 % 250 mL IVPB  Status:  Discontinued        500 mg 250 mL/hr over 60 Minutes Intravenous Every 24 hours 06/29/23 1352 06/29/23 1734      Subjective: Patient seen and evaluated today with ongoing cough and noted fever overnight.  She is starting to feel somewhat better.  Objective: Vitals:   06/29/23 2108 06/29/23 2245 06/30/23 0050 06/30/23 0636  BP:   101/71 114/67  Pulse:   79 87  Resp:      Temp: 99.8 F (37.7 C)  98.6 F (37 C) 99.2 F (37.3 C)  TempSrc: Oral  Oral Oral  SpO2:  96% 96% 90%  Weight:      Height:  Intake/Output Summary (Last 24 hours) at 06/30/2023 1036 Last data filed at 06/30/2023 0847 Gross per 24 hour  Intake 1277.47 ml  Output 1 ml  Net 1276.47 ml   Filed Weights   06/29/23 1323  Weight: 72.6 kg    Examination:  General exam: Appears calm and comfortable  Respiratory system: Clear to auscultation. Respiratory effort normal. Cardiovascular system: S1 & S2 heard, RRR.  Gastrointestinal system: Abdomen is soft Central nervous system: Alert and awake Extremities: No edema Skin: No significant lesions noted Psychiatry: Flat affect.    Data  Reviewed: I have personally reviewed following labs and imaging studies  CBC: Recent Labs  Lab 06/29/23 1425 06/30/23 0433  WBC 8.0 6.3  NEUTROABS 5.8  --   HGB 11.2* 9.7*  HCT 34.6* 31.6*  MCV 87.8 88.5  PLT 241 203   Basic Metabolic Panel: Recent Labs  Lab 06/29/23 1425 06/30/23 0433  NA 133* 138  K 3.3* 3.2*  CL 97* 102  CO2 26 25  GLUCOSE 97 88  BUN 13 14  CREATININE 1.43* 1.18*  CALCIUM 8.7* 8.1*  MG 2.3  --    GFR: Estimated Creatinine Clearance: 34.9 mL/min (A) (by C-G formula based on SCr of 1.18 mg/dL (H)). Liver Function Tests: Recent Labs  Lab 06/29/23 1425  AST 20  ALT 14  ALKPHOS 48  BILITOT 0.6  PROT 6.7  ALBUMIN 3.8   No results for input(s): "LIPASE", "AMYLASE" in the last 168 hours. No results for input(s): "AMMONIA" in the last 168 hours. Coagulation Profile: Recent Labs  Lab 06/29/23 1425  INR 1.0   Cardiac Enzymes: No results for input(s): "CKTOTAL", "CKMB", "CKMBINDEX", "TROPONINI" in the last 168 hours. BNP (last 3 results) Recent Labs    01/12/23 1224  PROBNP 89.0   HbA1C: No results for input(s): "HGBA1C" in the last 72 hours. CBG: No results for input(s): "GLUCAP" in the last 168 hours. Lipid Profile: No results for input(s): "CHOL", "HDL", "LDLCALC", "TRIG", "CHOLHDL", "LDLDIRECT" in the last 72 hours. Thyroid Function Tests: No results for input(s): "TSH", "T4TOTAL", "FREET4", "T3FREE", "THYROIDAB" in the last 72 hours. Anemia Panel: No results for input(s): "VITAMINB12", "FOLATE", "FERRITIN", "TIBC", "IRON", "RETICCTPCT" in the last 72 hours. Sepsis Labs: Recent Labs  Lab 06/29/23 1405  LATICACIDVEN 1.3    Recent Results (from the past 240 hours)  Resp panel by RT-PCR (RSV, Flu A&B, Covid) Anterior Nasal Swab     Status: None   Collection Time: 06/29/23  1:36 PM   Specimen: Anterior Nasal Swab  Result Value Ref Range Status   SARS Coronavirus 2 by RT PCR NEGATIVE NEGATIVE Final    Comment: (NOTE) SARS-CoV-2  target nucleic acids are NOT DETECTED.  The SARS-CoV-2 RNA is generally detectable in upper respiratory specimens during the acute phase of infection. The lowest concentration of SARS-CoV-2 viral copies this assay can detect is 138 copies/mL. A negative result does not preclude SARS-Cov-2 infection and should not be used as the sole basis for treatment or other patient management decisions. A negative result may occur with  improper specimen collection/handling, submission of specimen other than nasopharyngeal swab, presence of viral mutation(s) within the areas targeted by this assay, and inadequate number of viral copies(<138 copies/mL). A negative result must be combined with clinical observations, patient history, and epidemiological information. The expected result is Negative.  Fact Sheet for Patients:  BloggerCourse.com  Fact Sheet for Healthcare Providers:  SeriousBroker.it  This test is no t yet approved or cleared by the [place]  States FDA and  has been authorized for detection and/or diagnosis of SARS-CoV-2 by FDA under an Emergency Use Authorization (EUA). This EUA will remain  in effect (meaning this test can be used) for the duration of the COVID-19 declaration under Section 564(b)(1) of the Act, 21 U.S.C.section 360bbb-3(b)(1), unless the authorization is terminated  or revoked sooner.       Influenza A by PCR NEGATIVE NEGATIVE Final   Influenza B by PCR NEGATIVE NEGATIVE Final    Comment: (NOTE) The Xpert Xpress SARS-CoV-2/FLU/RSV plus assay is intended as an aid in the diagnosis of influenza from Nasopharyngeal swab specimens and should not be used as a sole basis for treatment. Nasal washings and aspirates are unacceptable for Xpert Xpress SARS-CoV-2/FLU/RSV testing.  Fact Sheet for Patients: BloggerCourse.com  Fact Sheet for Healthcare  Providers: SeriousBroker.it  This test is not yet approved or cleared by the [place] FDA and has been authorized for detection and/or diagnosis of SARS-CoV-2 by FDA under an Emergency Use Authorization (EUA). This EUA will remain in effect (meaning this test can be used) for the duration of the COVID-19 declaration under Section 564(b)(1) of the Act, 21 U.S.C. section 360bbb-3(b)(1), unless the authorization is terminated or revoked.     Resp Syncytial Virus by PCR NEGATIVE NEGATIVE Final    Comment: (NOTE) Fact Sheet for Patients: BloggerCourse.com  Fact Sheet for Healthcare Providers: SeriousBroker.it  This test is not yet approved or cleared by the [place] FDA and has been authorized for detection and/or diagnosis of SARS-CoV-2 by FDA under an Emergency Use Authorization (EUA). This EUA will remain in effect (meaning this test can be used) for the duration of the COVID-19 declaration under Section 564(b)(1) of the Act, 21 U.S.C. section 360bbb-3(b)(1), unless the authorization is terminated or revoked.  Performed at [facility], [address] [place], [place] [address]   Blood Culture (routine x 2)     Status: None (Preliminary result)   Collection Time: 06/29/23  2:05 PM   Specimen: BLOOD LEFT FOREARM  Result Value Ref Range Status   Specimen Description   Final    BLOOD LEFT FOREARM BOTTLES DRAWN AEROBIC AND ANAEROBIC   Special Requests Blood Culture adequate volume  Final   Culture   Final    NO GROWTH < 24 HOURS Performed at [facility], [address], [place], [place] [address]    Report Status PENDING  Incomplete  Blood Culture (routine x 2)     Status: None (Preliminary result)   Collection Time: 06/29/23  2:18 PM   Specimen: BLOOD LEFT HAND  Result Value Ref Range Status   Specimen Description   Final    BLOOD LEFT HAND BOTTLES DRAWN AEROBIC AND ANAEROBIC   Special  Requests   Final    Blood Culture results may not be optimal due to an inadequate volume of blood received in culture bottles   Culture   Final    NO GROWTH < 24 HOURS Performed at [facility], [address], [place], [place] [address]    Report Status PENDING  Incomplete         Radiology Studies: CT CHEST WO CONTRAST Result Date: 06/29/2023 CLINICAL DATA:  Sirs. Cough and fever. Headache, cough, runny nose. Fatigue. EXAM: CT CHEST WITHOUT CONTRAST TECHNIQUE: Multidetector CT imaging of the chest was performed following the standard protocol without IV contrast. RADIATION DOSE REDUCTION: This exam was performed according to the departmental dose-optimization program which includes automated exposure control, adjustment of the  mA and/or kV according to patient size and/or use of iterative reconstruction technique. COMPARISON:  Chest radiograph 06/29/2023.  CT chest 12/15/2021 FINDINGS: Cardiovascular: Normal heart size. No pericardial effusions. Normal caliber thoracic aorta. Calcification of the aorta and coronary arteries. Mediastinum/Nodes: Esophagus is decompressed. No significant lymphadenopathy. Thyroid gland is atrophic. Lungs/Pleura: Motion artifact limits evaluation. No airspace disease or consolidation is suggested in the lungs. No pleural effusions. No pneumothorax. Upper Abdomen: No acute abnormalities. Musculoskeletal: Degenerative changes in the spine. No acute displaced fractures identified. IMPRESSION: 1. No evidence of active pulmonary disease. 2. Aortic atherosclerosis. Electronically Signed   By: Burman Nieves M.D.   On: 06/29/2023 19:26   DG Chest Port 1 View Result Date: 06/29/2023 CLINICAL DATA:  Cough and fever for 1 week.  Possible sepsis. EXAM: PORTABLE CHEST 1 VIEW COMPARISON:  01/12/2023 FINDINGS: The heart size and mediastinal contours are within normal limits. Low lung volumes again noted. Both lungs are clear. The visualized skeletal structures are unremarkable.  IMPRESSION: No active disease. Electronically Signed   By: Danae Orleans M.D.   On: 06/29/2023 14:38        Scheduled Meds:  amLODipine  5 mg Oral Daily   carvedilol  6.25 mg Oral BID   guaiFENesin-dextromethorphan  15 mL Oral Q8H   heparin  5,000 Units Subcutaneous Q8H   levothyroxine  50 mcg Oral Q0600   LORazepam  1 mg Oral QHS   melatonin  9 mg Oral QHS   mupirocin ointment  1 Application Nasal BID   pantoprazole  40 mg Oral Daily   potassium chloride  40 mEq Oral BID   Continuous Infusions:  cefTRIAXone (ROCEPHIN)  IV Stopped (06/29/23 1449)   doxycycline (VIBRAMYCIN) IV       LOS: 0 days    Time spent: 35 minutes    Lindsea Olivar Hoover Brunette, DO [facility]  If 7PM-7AM, please contact night-coverage [email or web address] 06/30/2023, 10:36 AM

## 2023-06-30 NOTE — Plan of Care (Signed)

## 2023-06-30 NOTE — Plan of Care (Signed)

## 2023-06-30 NOTE — Progress Notes (Signed)
Patient declined CPAP tonight, states she didn't tolerate machine well last night. Unit at bedside if she changes her mind. Placed patient on 2 lpm nasal cannula for the night.

## 2023-06-30 NOTE — Care Management Obs Status (Signed)
MEDICARE OBSERVATION STATUS NOTIFICATION   Patient Details  Name: Jane Wilkerson MRN: [id number] Date of Birth: 05-22-1941   Medicare Observation Status Notification Given:  Yes    Villa Herb, LCSWA 06/30/2023, 10:51 AM

## 2023-07-01 DIAGNOSIS — D72829 Elevated white blood cell count, unspecified: Secondary | ICD-10-CM

## 2023-07-01 DIAGNOSIS — R6510 Systemic inflammatory response syndrome (SIRS) of non-infectious origin without acute organ dysfunction: Secondary | ICD-10-CM | POA: Diagnosis not present

## 2023-07-01 LAB — BASIC METABOLIC PANEL
Anion gap: 8 (ref 5–15)
BUN: 14 mg/dL (ref 8–23)
CO2: 25 mmol/L (ref 22–32)
Calcium: 8.3 mg/dL — ABNORMAL LOW (ref 8.9–10.3)
Chloride: 103 mmol/L (ref 98–111)
Creatinine, Ser: 1.2 mg/dL — ABNORMAL HIGH (ref 0.44–1.00)
GFR, Estimated: 45 mL/min — ABNORMAL LOW (ref >60)
Glucose, Bld: 139 mg/dL — ABNORMAL HIGH (ref 70–99)
Potassium: 3.8 mmol/L (ref 3.5–5.1)
Sodium: 136 mmol/L (ref 135–145)

## 2023-07-01 LAB — URINE CULTURE: Culture: NO GROWTH

## 2023-07-01 LAB — CBC
HCT: 33.8 % — ABNORMAL LOW (ref 36.0–46.0)
Hemoglobin: 10.5 g/dL — ABNORMAL LOW (ref 12.0–15.0)
MCH: 27.7 pg (ref 26.0–34.0)
MCHC: 31.1 g/dL (ref 30.0–36.0)
MCV: 89.2 fL (ref 80.0–100.0)
Platelets: 227 10*3/uL (ref 150–400)
RBC: 3.79 MIL/uL — ABNORMAL LOW (ref 3.87–5.11)
RDW: 15 % (ref 11.5–15.5)
WBC: 5.3 10*3/uL (ref 4.0–10.5)
nRBC: 0 % (ref 0.0–0.2)

## 2023-07-01 LAB — MAGNESIUM: Magnesium: 2.2 mg/dL (ref 1.7–2.4)

## 2023-07-01 NOTE — Plan of Care (Signed)
  Problem: Education: Goal: Knowledge of General Education information will improve Description: Including pain rating scale, medication(s)/side effects and non-pharmacologic comfort measures Outcome: Progressing   Problem: Health Behavior/Discharge Planning: Goal: Ability to manage health-related needs will improve Outcome: Progressing   Problem: Clinical Measurements: Goal: Ability to maintain clinical measurements within normal limits will improve Outcome: Progressing Goal: Respiratory complications will improve Outcome: Progressing Goal: Cardiovascular complication will be avoided Outcome: Progressing   Problem: Activity: Goal: Risk for activity intolerance will decrease Outcome: Progressing   Problem: Nutrition: Goal: Adequate nutrition will be maintained Outcome: Progressing   Problem: Coping: Goal: Level of anxiety will decrease Outcome: Progressing   Problem: Pain Management: Goal: General experience of comfort will improve Outcome: Progressing

## 2023-07-01 NOTE — Progress Notes (Signed)
Pt stated she slept better tonight and was starting to feel better since new antibiotics were administered. Pt did not run any fevers during shift however she did have night sweats throughout the night.

## 2023-07-01 NOTE — Discharge Summary (Signed)
Physician Discharge Summary  OLINE KNAPKE WUJ:[id number] DOB: [birth year] DOA: 06/29/2023  PCP: Elfredia Nevins, MD  Admit date: 06/29/2023  Discharge date: 07/01/2023  Admitted From:Home  Disposition:  Home  Recommendations for Outpatient Follow-up:  Follow up with PCP in 1-2 weeks Continue home medications as prior  Home Health: None  Equipment/Devices: None, has home CPAP for sleep apnea  Discharge Condition:Stable  CODE STATUS: Full  Diet recommendation: Heart Healthy  Brief/Interim Summary: Jane Wilkerson is a 82 y.o. female with medical history significant for coronary artery disease, hypertension, OSA. Patient presented to the ED with complaints of headache, dry cough, runny nose and fever.  She reports difficulty breathing with exertion.  Patient admitted with SIRS criteria in the setting of respiratory infection and was started on IV antibiotics empirically, but no source of bacterial infection was found.  She was ultimately noted to have positivity for rhinovirus on her respiratory panel.  She is overall feeling much better and is now in stable condition for discharge.  No other acute events or concerns noted throughout the course of this hospitalization.  Discharge Diagnoses:  Principal Problem:   SIRS (systemic inflammatory response syndrome) (HCC) Active Problems:   Hypothyroidism   Essential hypertension   CAD (coronary artery disease)   OSA (obstructive sleep apnea)   Chronic kidney disease   Pneumonia  Principal discharge diagnosis: Rhinovirus infection.  Discharge Instructions  Discharge Instructions     Diet - low sodium heart healthy   Complete by: As directed    Increase activity slowly   Complete by: As directed       Allergies as of 07/01/2023       Reactions   Bisoprolol-hydrochlorothiazide Other (See Comments)   Unknown   Hydrocodone Other (See Comments)   Unknown   Lipitor [atorvastatin] Itching   Lisinopril Itching   Oxycodone  Itching   Oxycodone Hcl Itching   Ziac [bisoprolol-hydrochlorothiazide] Other (See Comments)   Unknown Patient taking hydrochlorothiazide with no reaction.   Penicillins Rash           Medication List     TAKE these medications    AeroChamber Plus inhaler Use with inhaler   amLODipine 5 MG tablet Commonly known as: NORVASC TAKE 1 TABLET(5 MG) BY MOUTH DAILY   carvedilol 12.5 MG tablet Commonly known as: COREG Take 1 tablet (12.5 mg total) by mouth 2 (two) times daily. What changed: how much to take   levothyroxine 50 MCG tablet Commonly known as: SYNTHROID Take 50 mcg by mouth daily.   LORazepam 1 MG tablet Commonly known as: ATIVAN Take 1 mg by mouth at bedtime. For anxiety may take extra tablet if needed   nitroGLYCERIN 0.4 MG SL tablet Commonly known as: NITROSTAT Place 1 tablet (0.4 mg total) under the tongue every 5 (five) minutes as needed for chest pain.   omeprazole 40 MG capsule Commonly known as: PRILOSEC TAKE 1 CAPSULE BY MOUTH SHORTLY BEFORE BREAKFAST DAILY. What changed: See the new instructions.   psyllium 58.6 % powder Commonly known as: METAMUCIL Take 1 packet by mouth daily as needed (constipation).   rosuvastatin 40 MG tablet Commonly known as: CRESTOR TAKE 1 TABLET(40 MG) BY MOUTH DAILY        Follow-up Information     Elfredia Nevins, MD. Schedule an appointment as soon as possible for a visit in 1 week(s).   Specialty: Internal Medicine Contact information: [address] [place] [address] [phone]  Allergies  Allergen Reactions   Bisoprolol-Hydrochlorothiazide Other (See Comments)    Unknown   Hydrocodone Other (See Comments)    Unknown   Lipitor [Atorvastatin] Itching   Lisinopril Itching   Oxycodone Itching   Oxycodone Hcl Itching   Ziac [Bisoprolol-Hydrochlorothiazide] Other (See Comments)    Unknown Patient taking hydrochlorothiazide with no reaction.   Penicillins Rash          Consultations: None   Procedures/Studies: CT CHEST WO CONTRAST Result Date: 06/29/2023 CLINICAL DATA:  Sirs. Cough and fever. Headache, cough, runny nose. Fatigue. EXAM: CT CHEST WITHOUT CONTRAST TECHNIQUE: Multidetector CT imaging of the chest was performed following the standard protocol without IV contrast. RADIATION DOSE REDUCTION: This exam was performed according to the departmental dose-optimization program which includes automated exposure control, adjustment of the mA and/or kV according to patient size and/or use of iterative reconstruction technique. COMPARISON:  Chest radiograph 06/29/2023.  CT chest 12/15/2021 FINDINGS: Cardiovascular: Normal heart size. No pericardial effusions. Normal caliber thoracic aorta. Calcification of the aorta and coronary arteries. Mediastinum/Nodes: Esophagus is decompressed. No significant lymphadenopathy. Thyroid gland is atrophic. Lungs/Pleura: Motion artifact limits evaluation. No airspace disease or consolidation is suggested in the lungs. No pleural effusions. No pneumothorax. Upper Abdomen: No acute abnormalities. Musculoskeletal: Degenerative changes in the spine. No acute displaced fractures identified. IMPRESSION: 1. No evidence of active pulmonary disease. 2. Aortic atherosclerosis. Electronically Signed   By: Burman Nieves M.D.   On: 06/29/2023 19:26   DG Chest Port 1 View Result Date: 06/29/2023 CLINICAL DATA:  Cough and fever for 1 week.  Possible sepsis. EXAM: PORTABLE CHEST 1 VIEW COMPARISON:  01/12/2023 FINDINGS: The heart size and mediastinal contours are within normal limits. Low lung volumes again noted. Both lungs are clear. The visualized skeletal structures are unremarkable. IMPRESSION: No active disease. Electronically Signed   By: Danae Orleans M.D.   On: 06/29/2023 14:38     Discharge Exam: Vitals:   07/01/23 0422 07/01/23 0848  BP: 107/73 104/63  Pulse: 82 78  Resp:    Temp: 98 F (36.7 C)   SpO2: 98%    Vitals:    06/30/23 2042 06/30/23 2135 07/01/23 0422 07/01/23 0848  BP:  109/61 107/73 104/63  Pulse:  90 82 78  Resp: 14     Temp:  99.2 F (37.3 C) 98 F (36.7 C)   TempSrc:  Oral Oral   SpO2:  99% 98%   Weight:      Height:        General: Pt is alert, awake, not in acute distress Cardiovascular: RRR, S1/S2 +, no rubs, no gallops Respiratory: CTA bilaterally, no wheezing, no rhonchi Abdominal: Soft, NT, ND, bowel sounds + Extremities: no edema, no cyanosis    The results of significant diagnostics from this hospitalization (including imaging, microbiology, ancillary and laboratory) are listed below for reference.     Microbiology: Recent Results (from the past 240 hours)  Resp panel by RT-PCR (RSV, Flu A&B, Covid) Anterior Nasal Swab     Status: None   Collection Time: 06/29/23  1:36 PM   Specimen: Anterior Nasal Swab  Result Value Ref Range Status   SARS Coronavirus 2 by RT PCR NEGATIVE NEGATIVE Final    Comment: (NOTE) SARS-CoV-2 target nucleic acids are NOT DETECTED.  The SARS-CoV-2 RNA is generally detectable in upper respiratory specimens during the acute phase of infection. The lowest concentration of SARS-CoV-2 viral copies this assay can detect is 138 copies/mL. A negative result does not preclude  SARS-Cov-2 infection and should not be used as the sole basis for treatment or other patient management decisions. A negative result may occur with  improper specimen collection/handling, submission of specimen other than nasopharyngeal swab, presence of viral mutation(s) within the areas targeted by this assay, and inadequate number of viral copies(<138 copies/mL). A negative result must be combined with clinical observations, patient history, and epidemiological information. The expected result is Negative.  Fact Sheet for Patients:  BloggerCourse.com  Fact Sheet for Healthcare Providers:  SeriousBroker.it  This test is  no t yet approved or cleared by the [place] FDA and  has been authorized for detection and/or diagnosis of SARS-CoV-2 by FDA under an Emergency Use Authorization (EUA). This EUA will remain  in effect (meaning this test can be used) for the duration of the COVID-19 declaration under Section 564(b)(1) of the Act, 21 U.S.C.section 360bbb-3(b)(1), unless the authorization is terminated  or revoked sooner.       Influenza A by PCR NEGATIVE NEGATIVE Final   Influenza B by PCR NEGATIVE NEGATIVE Final    Comment: (NOTE) The Xpert Xpress SARS-CoV-2/FLU/RSV plus assay is intended as an aid in the diagnosis of influenza from Nasopharyngeal swab specimens and should not be used as a sole basis for treatment. Nasal washings and aspirates are unacceptable for Xpert Xpress SARS-CoV-2/FLU/RSV testing.  Fact Sheet for Patients: BloggerCourse.com  Fact Sheet for Healthcare Providers: SeriousBroker.it  This test is not yet approved or cleared by the [place] FDA and has been authorized for detection and/or diagnosis of SARS-CoV-2 by FDA under an Emergency Use Authorization (EUA). This EUA will remain in effect (meaning this test can be used) for the duration of the COVID-19 declaration under Section 564(b)(1) of the Act, 21 U.S.C. section 360bbb-3(b)(1), unless the authorization is terminated or revoked.     Resp Syncytial Virus by PCR NEGATIVE NEGATIVE Final    Comment: (NOTE) Fact Sheet for Patients: BloggerCourse.com  Fact Sheet for Healthcare Providers: SeriousBroker.it  This test is not yet approved or cleared by the [place] FDA and has been authorized for detection and/or diagnosis of SARS-CoV-2 by FDA under an Emergency Use Authorization (EUA). This EUA will remain in effect (meaning this test can be used) for the duration of the COVID-19 declaration under Section  564(b)(1) of the Act, 21 U.S.C. section 360bbb-3(b)(1), unless the authorization is terminated or revoked.  Performed at [facility], [address], [place], [place] [address]   Blood Culture (routine x 2)     Status: None (Preliminary result)   Collection Time: 06/29/23  2:05 PM   Specimen: BLOOD LEFT FOREARM  Result Value Ref Range Status   Specimen Description   Final    BLOOD LEFT FOREARM BOTTLES DRAWN AEROBIC AND ANAEROBIC   Special Requests Blood Culture adequate volume  Final   Culture   Final    NO GROWTH 2 DAYS Performed at [facility], [address] [place], [place] [address]    Report Status PENDING  Incomplete  Blood Culture (routine x 2)     Status: None (Preliminary result)   Collection Time: 06/29/23  2:18 PM   Specimen: BLOOD LEFT HAND  Result Value Ref Range Status   Specimen Description   Final    BLOOD LEFT HAND BOTTLES DRAWN AEROBIC AND ANAEROBIC   Special Requests   Final    Blood Culture results may not be optimal due to an inadequate volume of blood received in culture bottles   Culture  Final    NO GROWTH 2 DAYS Performed at [facility], [address] [place], [place] [address]    Report Status PENDING  Incomplete  Urine Culture (for pregnant, neutropenic or urologic patients or patients with an indwelling urinary catheter)     Status: None   Collection Time: 06/29/23  3:16 PM   Specimen: Urine, Clean Catch  Result Value Ref Range Status   Specimen Description   Final    URINE, CLEAN CATCH Performed at [facility], [address], [place], [place] [address]    Special Requests   Final    NONE Performed at [facility], [address], [place], [place] [address]    Culture   Final    NO GROWTH Performed at [facility], [address], [place], [place] [address]    Report Status 07/01/2023 FINAL  Final  Respiratory (~20 pathogens) panel by PCR     Status: Abnormal   Collection Time: 06/30/23  7:11 AM   Specimen: Nasopharyngeal Swab;  Respiratory  Result Value Ref Range Status   Adenovirus NOT DETECTED NOT DETECTED Final   Coronavirus 229E NOT DETECTED NOT DETECTED Final    Comment: (NOTE) The Coronavirus on the Respiratory Panel, DOES NOT test for the novel  Coronavirus (2019 nCoV)    Coronavirus HKU1 NOT DETECTED NOT DETECTED Final   Coronavirus NL63 NOT DETECTED NOT DETECTED Final   Coronavirus OC43 NOT DETECTED NOT DETECTED Final   Metapneumovirus NOT DETECTED NOT DETECTED Final   Rhinovirus / Enterovirus DETECTED (A) NOT DETECTED Final   Influenza A NOT DETECTED NOT DETECTED Final   Influenza B NOT DETECTED NOT DETECTED Final   Parainfluenza Virus 1 NOT DETECTED NOT DETECTED Final   Parainfluenza Virus 2 NOT DETECTED NOT DETECTED Final   Parainfluenza Virus 3 NOT DETECTED NOT DETECTED Final   Parainfluenza Virus 4 NOT DETECTED NOT DETECTED Final   Respiratory Syncytial Virus NOT DETECTED NOT DETECTED Final   Bordetella pertussis NOT DETECTED NOT DETECTED Final   Bordetella Parapertussis NOT DETECTED NOT DETECTED Final   Chlamydophila pneumoniae NOT DETECTED NOT DETECTED Final   Mycoplasma pneumoniae NOT DETECTED NOT DETECTED Final    Comment: Performed at [facility], [address] [place], [place] [address]     Labs: BNP (last 3 results) No results for input(s): "BNP" in the last 8760 hours. Basic Metabolic Panel: Recent Labs  Lab 06/29/23 1425 06/30/23 0433 07/01/23 0419  NA 133* 138 136  K 3.3* 3.2* 3.8  CL 97* 102 103  CO2 26 25 25   GLUCOSE 97 88 139*  BUN 13 14 14   CREATININE 1.43* 1.18* 1.20*  CALCIUM 8.7* 8.1* 8.3*  MG 2.3  --  2.2   Liver Function Tests: Recent Labs  Lab 06/29/23 1425  AST 20  ALT 14  ALKPHOS 48  BILITOT 0.6  PROT 6.7  ALBUMIN 3.8   No results for input(s): "LIPASE", "AMYLASE" in the last 168 hours. No results for input(s): "AMMONIA" in the last 168 hours. CBC: Recent Labs  Lab 06/29/23 1425 06/30/23 0433 07/01/23 0419  WBC 8.0 6.3 5.3   NEUTROABS 5.8  --   --   HGB 11.2* 9.7* 10.5*  HCT 34.6* 31.6* 33.8*  MCV 87.8 88.5 89.2  PLT 241 203 227   Cardiac Enzymes: No results for input(s): "CKTOTAL", "CKMB", "CKMBINDEX", "TROPONINI" in the last 168 hours. BNP: Invalid input(s): "POCBNP" CBG: No results for input(s): "GLUCAP" in the last 168 hours. D-Dimer No  results for input(s): "DDIMER" in the last 72 hours. Hgb A1c No results for input(s): "HGBA1C" in the last 72 hours. Lipid Profile No results for input(s): "CHOL", "HDL", "LDLCALC", "TRIG", "CHOLHDL", "LDLDIRECT" in the last 72 hours. Thyroid function studies No results for input(s): "TSH", "T4TOTAL", "T3FREE", "THYROIDAB" in the last 72 hours.  Invalid input(s): "FREET3" Anemia work up Recent Labs    06/30/23 0433  VITAMINB12 77*  FOLATE 9.1  FERRITIN 25  TIBC 340  IRON 28  RETICCTPCT 1.7   Urinalysis    Component Value Date/Time   COLORURINE STRAW (A) 06/29/2023 1449   APPEARANCEUR CLEAR 06/29/2023 1449   LABSPEC 1.006 06/29/2023 1449   PHURINE 6.0 06/29/2023 1449   GLUCOSEU NEGATIVE 06/29/2023 1449   HGBUR SMALL (A) 06/29/2023 1449   BILIRUBINUR NEGATIVE 06/29/2023 1449   KETONESUR NEGATIVE 06/29/2023 1449   PROTEINUR NEGATIVE 06/29/2023 1449   NITRITE NEGATIVE 06/29/2023 1449   LEUKOCYTESUR MODERATE (A) 06/29/2023 1449   Sepsis Labs Recent Labs  Lab 06/29/23 1425 06/30/23 0433 07/01/23 0419  WBC 8.0 6.3 5.3   Microbiology Recent Results (from the past 240 hours)  Resp panel by RT-PCR (RSV, Flu A&B, Covid) Anterior Nasal Swab     Status: None   Collection Time: 06/29/23  1:36 PM   Specimen: Anterior Nasal Swab  Result Value Ref Range Status   SARS Coronavirus 2 by RT PCR NEGATIVE NEGATIVE Final    Comment: (NOTE) SARS-CoV-2 target nucleic acids are NOT DETECTED.  The SARS-CoV-2 RNA is generally detectable in upper respiratory specimens during the acute phase of infection. The lowest concentration of SARS-CoV-2 viral copies this  assay can detect is 138 copies/mL. A negative result does not preclude SARS-Cov-2 infection and should not be used as the sole basis for treatment or other patient management decisions. A negative result may occur with  improper specimen collection/handling, submission of specimen other than nasopharyngeal swab, presence of viral mutation(s) within the areas targeted by this assay, and inadequate number of viral copies(<138 copies/mL). A negative result must be combined with clinical observations, patient history, and epidemiological information. The expected result is Negative.  Fact Sheet for Patients:  BloggerCourse.com  Fact Sheet for Healthcare Providers:  SeriousBroker.it  This test is no t yet approved or cleared by the [place] FDA and  has been authorized for detection and/or diagnosis of SARS-CoV-2 by FDA under an Emergency Use Authorization (EUA). This EUA will remain  in effect (meaning this test can be used) for the duration of the COVID-19 declaration under Section 564(b)(1) of the Act, 21 U.S.C.section 360bbb-3(b)(1), unless the authorization is terminated  or revoked sooner.       Influenza A by PCR NEGATIVE NEGATIVE Final   Influenza B by PCR NEGATIVE NEGATIVE Final    Comment: (NOTE) The Xpert Xpress SARS-CoV-2/FLU/RSV plus assay is intended as an aid in the diagnosis of influenza from Nasopharyngeal swab specimens and should not be used as a sole basis for treatment. Nasal washings and aspirates are unacceptable for Xpert Xpress SARS-CoV-2/FLU/RSV testing.  Fact Sheet for Patients: BloggerCourse.com  Fact Sheet for Healthcare Providers: SeriousBroker.it  This test is not yet approved or cleared by the [place] FDA and has been authorized for detection and/or diagnosis of SARS-CoV-2 by FDA under an Emergency Use Authorization (EUA). This EUA will  remain in effect (meaning this test can be used) for the duration of the COVID-19 declaration under Section 564(b)(1) of the Act, 21 U.S.C. section 360bbb-3(b)(1), unless the authorization is terminated  or revoked.     Resp Syncytial Virus by PCR NEGATIVE NEGATIVE Final    Comment: (NOTE) Fact Sheet for Patients: BloggerCourse.com  Fact Sheet for Healthcare Providers: SeriousBroker.it  This test is not yet approved or cleared by the [place] FDA and has been authorized for detection and/or diagnosis of SARS-CoV-2 by FDA under an Emergency Use Authorization (EUA). This EUA will remain in effect (meaning this test can be used) for the duration of the COVID-19 declaration under Section 564(b)(1) of the Act, 21 U.S.C. section 360bbb-3(b)(1), unless the authorization is terminated or revoked.  Performed at [facility], [address], [place], [place] [address]   Blood Culture (routine x 2)     Status: None (Preliminary result)   Collection Time: 06/29/23  2:05 PM   Specimen: BLOOD LEFT FOREARM  Result Value Ref Range Status   Specimen Description   Final    BLOOD LEFT FOREARM BOTTLES DRAWN AEROBIC AND ANAEROBIC   Special Requests Blood Culture adequate volume  Final   Culture   Final    NO GROWTH 2 DAYS Performed at [facility], [address], [place], [place] [address]    Report Status PENDING  Incomplete  Blood Culture (routine x 2)     Status: None (Preliminary result)   Collection Time: 06/29/23  2:18 PM   Specimen: BLOOD LEFT HAND  Result Value Ref Range Status   Specimen Description   Final    BLOOD LEFT HAND BOTTLES DRAWN AEROBIC AND ANAEROBIC   Special Requests   Final    Blood Culture results may not be optimal due to an inadequate volume of blood received in culture bottles   Culture   Final    NO GROWTH 2 DAYS Performed at [facility], [address], [place], [place] [address]    Report Status PENDING   Incomplete  Urine Culture (for pregnant, neutropenic or urologic patients or patients with an indwelling urinary catheter)     Status: None   Collection Time: 06/29/23  3:16 PM   Specimen: Urine, Clean Catch  Result Value Ref Range Status   Specimen Description   Final    URINE, CLEAN CATCH Performed at [facility], [address], [place], [place] [address]    Special Requests   Final    NONE Performed at [facility], [address], [place], [place] [address]    Culture   Final    NO GROWTH Performed at [facility], [address], [place], [place] [address]    Report Status 07/01/2023 FINAL  Final  Respiratory (~20 pathogens) panel by PCR     Status: Abnormal   Collection Time: 06/30/23  7:11 AM   Specimen: Nasopharyngeal Swab; Respiratory  Result Value Ref Range Status   Adenovirus NOT DETECTED NOT DETECTED Final   Coronavirus 229E NOT DETECTED NOT DETECTED Final    Comment: (NOTE) The Coronavirus on the Respiratory Panel, DOES NOT test for the novel  Coronavirus (2019 nCoV)    Coronavirus HKU1 NOT DETECTED NOT DETECTED Final   Coronavirus NL63 NOT DETECTED NOT DETECTED Final   Coronavirus OC43 NOT DETECTED NOT DETECTED Final   Metapneumovirus NOT DETECTED NOT DETECTED Final   Rhinovirus / Enterovirus DETECTED (A) NOT DETECTED Final   Influenza A NOT DETECTED NOT DETECTED Final   Influenza B NOT DETECTED NOT DETECTED Final   Parainfluenza Virus 1 NOT DETECTED NOT DETECTED Final   Parainfluenza Virus 2 NOT DETECTED NOT DETECTED Final   Parainfluenza  Virus 3 NOT DETECTED NOT DETECTED Final   Parainfluenza Virus 4 NOT DETECTED NOT DETECTED Final   Respiratory Syncytial Virus NOT DETECTED NOT DETECTED Final   Bordetella pertussis NOT DETECTED NOT DETECTED Final   Bordetella Parapertussis NOT DETECTED NOT DETECTED Final   Chlamydophila pneumoniae NOT DETECTED NOT DETECTED Final   Mycoplasma pneumoniae NOT DETECTED NOT DETECTED Final    Comment: Performed at [facility], [address], [place], [place] [address]     Time coordinating discharge: 35 minutes  SIGNED:   Erick Blinks, DO [facility] 07/01/2023, 10:21 AM  If 7PM-7AM, please contact night-coverage [email or web address]

## 2023-07-04 LAB — CULTURE, BLOOD (ROUTINE X 2)
Culture: NO GROWTH
Culture: NO GROWTH
Special Requests: ADEQUATE

## 2023-07-05 DIAGNOSIS — Z6829 Body mass index (BMI) 29.0-29.9, adult: Secondary | ICD-10-CM | POA: Diagnosis not present

## 2023-07-05 DIAGNOSIS — E663 Overweight: Secondary | ICD-10-CM | POA: Diagnosis not present

## 2023-07-05 DIAGNOSIS — G9332 Myalgic encephalomyelitis/chronic fatigue syndrome: Secondary | ICD-10-CM | POA: Diagnosis not present

## 2023-07-05 DIAGNOSIS — J189 Pneumonia, unspecified organism: Secondary | ICD-10-CM | POA: Diagnosis not present

## 2023-07-12 DIAGNOSIS — Z961 Presence of intraocular lens: Secondary | ICD-10-CM | POA: Diagnosis not present

## 2023-07-12 DIAGNOSIS — H04123 Dry eye syndrome of bilateral lacrimal glands: Secondary | ICD-10-CM | POA: Diagnosis not present

## 2023-07-12 DIAGNOSIS — G4733 Obstructive sleep apnea (adult) (pediatric): Secondary | ICD-10-CM | POA: Diagnosis not present

## 2023-07-13 ENCOUNTER — Other Ambulatory Visit: Payer: Self-pay | Admitting: Internal Medicine

## 2023-07-13 ENCOUNTER — Other Ambulatory Visit: Payer: Self-pay | Admitting: Physician Assistant

## 2023-08-01 DIAGNOSIS — E663 Overweight: Secondary | ICD-10-CM | POA: Diagnosis not present

## 2023-08-01 DIAGNOSIS — Z6829 Body mass index (BMI) 29.0-29.9, adult: Secondary | ICD-10-CM | POA: Diagnosis not present

## 2023-08-01 DIAGNOSIS — J189 Pneumonia, unspecified organism: Secondary | ICD-10-CM | POA: Diagnosis not present

## 2023-08-01 DIAGNOSIS — M5431 Sciatica, right side: Secondary | ICD-10-CM | POA: Diagnosis not present

## 2023-08-11 ENCOUNTER — Other Ambulatory Visit: Payer: Self-pay | Admitting: Internal Medicine

## 2023-08-12 DIAGNOSIS — G4733 Obstructive sleep apnea (adult) (pediatric): Secondary | ICD-10-CM | POA: Diagnosis not present

## 2023-09-02 DIAGNOSIS — E1129 Type 2 diabetes mellitus with other diabetic kidney complication: Secondary | ICD-10-CM | POA: Diagnosis not present

## 2023-09-02 DIAGNOSIS — G4733 Obstructive sleep apnea (adult) (pediatric): Secondary | ICD-10-CM | POA: Diagnosis not present

## 2023-09-02 DIAGNOSIS — I5032 Chronic diastolic (congestive) heart failure: Secondary | ICD-10-CM | POA: Diagnosis not present

## 2023-09-02 DIAGNOSIS — E1122 Type 2 diabetes mellitus with diabetic chronic kidney disease: Secondary | ICD-10-CM | POA: Diagnosis not present

## 2023-09-02 DIAGNOSIS — R809 Proteinuria, unspecified: Secondary | ICD-10-CM | POA: Diagnosis not present

## 2023-09-02 DIAGNOSIS — N1832 Chronic kidney disease, stage 3b: Secondary | ICD-10-CM | POA: Diagnosis not present

## 2023-09-08 DIAGNOSIS — R809 Proteinuria, unspecified: Secondary | ICD-10-CM | POA: Diagnosis not present

## 2023-09-08 DIAGNOSIS — E1129 Type 2 diabetes mellitus with other diabetic kidney complication: Secondary | ICD-10-CM | POA: Diagnosis not present

## 2023-09-08 DIAGNOSIS — N1831 Chronic kidney disease, stage 3a: Secondary | ICD-10-CM | POA: Diagnosis not present

## 2023-09-08 DIAGNOSIS — E1122 Type 2 diabetes mellitus with diabetic chronic kidney disease: Secondary | ICD-10-CM | POA: Diagnosis not present

## 2023-09-09 DIAGNOSIS — G4733 Obstructive sleep apnea (adult) (pediatric): Secondary | ICD-10-CM | POA: Diagnosis not present

## 2023-10-10 DIAGNOSIS — G4733 Obstructive sleep apnea (adult) (pediatric): Secondary | ICD-10-CM | POA: Diagnosis not present

## 2023-10-12 ENCOUNTER — Other Ambulatory Visit: Payer: Self-pay | Admitting: Physician Assistant

## 2023-11-01 DIAGNOSIS — G4733 Obstructive sleep apnea (adult) (pediatric): Secondary | ICD-10-CM | POA: Diagnosis not present

## 2023-11-03 ENCOUNTER — Other Ambulatory Visit: Payer: Self-pay | Admitting: Physician Assistant

## 2023-11-03 ENCOUNTER — Other Ambulatory Visit: Payer: Self-pay | Admitting: Internal Medicine

## 2023-11-09 DIAGNOSIS — G4733 Obstructive sleep apnea (adult) (pediatric): Secondary | ICD-10-CM | POA: Diagnosis not present

## 2023-11-18 DIAGNOSIS — E063 Autoimmune thyroiditis: Secondary | ICD-10-CM | POA: Diagnosis not present

## 2023-11-18 DIAGNOSIS — I872 Venous insufficiency (chronic) (peripheral): Secondary | ICD-10-CM | POA: Diagnosis not present

## 2023-11-18 DIAGNOSIS — F419 Anxiety disorder, unspecified: Secondary | ICD-10-CM | POA: Diagnosis not present

## 2023-11-18 DIAGNOSIS — G4733 Obstructive sleep apnea (adult) (pediatric): Secondary | ICD-10-CM | POA: Diagnosis not present

## 2023-11-18 DIAGNOSIS — E663 Overweight: Secondary | ICD-10-CM | POA: Diagnosis not present

## 2023-11-18 DIAGNOSIS — G5601 Carpal tunnel syndrome, right upper limb: Secondary | ICD-10-CM | POA: Diagnosis not present

## 2023-11-18 DIAGNOSIS — I10 Essential (primary) hypertension: Secondary | ICD-10-CM | POA: Diagnosis not present

## 2023-11-18 DIAGNOSIS — E782 Mixed hyperlipidemia: Secondary | ICD-10-CM | POA: Diagnosis not present

## 2023-11-18 DIAGNOSIS — M5431 Sciatica, right side: Secondary | ICD-10-CM | POA: Diagnosis not present

## 2023-11-19 ENCOUNTER — Other Ambulatory Visit: Payer: Self-pay | Admitting: Internal Medicine

## 2023-12-09 ENCOUNTER — Inpatient Hospital Stay: Payer: Medicare PPO

## 2023-12-09 ENCOUNTER — Ambulatory Visit (HOSPITAL_COMMUNITY): Payer: Medicare PPO

## 2023-12-10 DIAGNOSIS — G4733 Obstructive sleep apnea (adult) (pediatric): Secondary | ICD-10-CM | POA: Diagnosis not present

## 2023-12-12 DIAGNOSIS — M5431 Sciatica, right side: Secondary | ICD-10-CM | POA: Diagnosis not present

## 2023-12-12 DIAGNOSIS — K219 Gastro-esophageal reflux disease without esophagitis: Secondary | ICD-10-CM | POA: Diagnosis not present

## 2023-12-12 DIAGNOSIS — E559 Vitamin D deficiency, unspecified: Secondary | ICD-10-CM | POA: Diagnosis not present

## 2023-12-12 DIAGNOSIS — D1391 Familial adenomatous polyposis: Secondary | ICD-10-CM | POA: Diagnosis not present

## 2023-12-12 DIAGNOSIS — Z0001 Encounter for general adult medical examination with abnormal findings: Secondary | ICD-10-CM | POA: Diagnosis not present

## 2023-12-12 DIAGNOSIS — I10 Essential (primary) hypertension: Secondary | ICD-10-CM | POA: Diagnosis not present

## 2023-12-12 DIAGNOSIS — E063 Autoimmune thyroiditis: Secondary | ICD-10-CM | POA: Diagnosis not present

## 2023-12-12 DIAGNOSIS — I872 Venous insufficiency (chronic) (peripheral): Secondary | ICD-10-CM | POA: Diagnosis not present

## 2023-12-12 DIAGNOSIS — E538 Deficiency of other specified B group vitamins: Secondary | ICD-10-CM | POA: Diagnosis not present

## 2023-12-12 DIAGNOSIS — G4733 Obstructive sleep apnea (adult) (pediatric): Secondary | ICD-10-CM | POA: Diagnosis not present

## 2023-12-12 DIAGNOSIS — E6609 Other obesity due to excess calories: Secondary | ICD-10-CM | POA: Diagnosis not present

## 2023-12-14 NOTE — Progress Notes (Signed)
 [facility] ([place]) [address] [place], [place] [address]   CLINIC:  Medical Oncology/Hematology  PCP:  Kathyleen Parkins, MD [address] / [place] [address] [phone]   REASON FOR VISIT:  Follow-up for history of right-sided breast cancer (DCIS)  PRIOR THERAPY: Lumpectomy, adjuvant XRT, antiestrogen therapy  CURRENT THERAPY: Surveillance  BRIEF ONCOLOGIC HISTORY:   Oncology History  History of right breast cancer  07/02/2011 Surgery   Right lumpectomy: DCIS ER 100%, PR 0%   07/23/2011 - 08/20/2011 Radiation Therapy   Adjuvant XRT   11/19/2011 - 11/30/2016 Anti-estrogen oral therapy   Aromasin  25 mg daily X 1 month then changed to Arimidex  1 mg daily    CANCER STAGING:  Cancer Staging  History of right breast cancer Staging form: Breast, AJCC 7th Edition - Pathologic: Stage 0 (Tis (DCIS), N0, cM0) - Signed by Chaneta Comer, MD on 05/06/2014   INTERVAL HISTORY:   [name], a 83 y.o. female, seen for long-term survivorship of right breast DCIS that was in 2012.  She was last seen by Sheril Dines PA-C on 12/13/2022.  At today's visit, she  reports feeling poorly due to chronic health conditions and recent situational stressors (her husband had a stroke 2 months ago). She denies any recent hospitalizations, surgeries, or changes in her  baseline health status. She denies any new breast lumps or axillary lymphadenopathy she reports that the scar tissue on her right breast will pull sometimes. She has some chronic dyspnea on exertion which is stable at baseline. She has some general aches and pains, but denies any new onset bone pain, chest pain, or abdominal pain. She has no new headaches, seizures, or focal neurologic deficits. No B symptoms such as fever, chills, night sweats, unintentional weight loss. She reports little to no energy and 100% appetite. She is maintaining stable weight at this time.  ASSESSMENT & PLAN:  1.   History of right breast DCIS (2012) - Right breast lumpectomy (07/02/2011) revealed DCIS, ER 100%, PR 0% - She underwent adjuvant XRT from 07/23/2011 through 08/20/2011 - She completed 5 years of treatment with antiestrogen oral therapy (Aromasin /Arimidex ) from 11/19/2011 through 11/30/2016. - Most recent mammogram (12/06/2022): BI-RADS Category 2, benign.  Breast density category B.  Stable postlumpectomy changes in right breast, no findings suspicious for malignancy. - Labs (12/12/2023 @ [facility] via PCP / Dr. Lewayne Records) show CBC at baseline, normal LFTs, and CKD stage IIIb at baseline.  Vitamin D  is normal. - Physical exam (12/19/23) shows right lumpectomy scar within normal limits.  No discrete nodules, masses, or lymphadenopathy in either breast or surrounding tissue. - No red-flag symptoms concerning for recurrent for breast cancer at this time - PLAN:  She is overdue for mammogram (no-show for mammogram scheduled 12/09/2023) - Patient prefers follow-up annually with oncologist rather than discharge to PCP.  We will see her in 1 year for repeat mammogram, physical exam, and labs as appropriate.    PLAN SUMMARY:   >> Overdue for screening mammogram (NS on 12/09/2023) >> Labs in 1 year = CBC/D, CMP, vitamin D  >> Mammogram in 1 year (reminder sent to schedule mammogram after April 2026)  >> OFFICE visit in 1 year (1 week after labs/mammogram)    REVIEW OF SYSTEMS:   Review of Systems  Constitutional:  Positive for fatigue. Negative for appetite change, chills, diaphoresis, fever and unexpected weight change.  HENT:   Negative for lump/mass and nosebleeds.   Eyes:  Negative for eye  problems.  Respiratory:  Positive for shortness of breath (with exertion). Negative for cough and hemoptysis.   Cardiovascular:  Negative for chest pain, leg swelling and palpitations.  Gastrointestinal:  Positive for diarrhea, nausea and vomiting. Negative for abdominal pain, blood in stool and constipation.  Genitourinary:   Negative for hematuria.   Musculoskeletal:  Positive for arthralgias, back pain and myalgias.  Skin: Negative.   Neurological:  Positive for light-headedness. Negative for dizziness, headaches and numbness.  Hematological:  Does not bruise/bleed easily.  Psychiatric/Behavioral:  Positive for depression and sleep disturbance. The patient is nervous/anxious.     PHYSICAL EXAM:   Performance status (ECOG): 1 - Symptomatic but completely ambulatory  Vitals:   12/19/23 1013  BP: 127/88  Pulse: 83  Resp: 18  Temp: 98.4 F (36.9 C)  SpO2: 100%   Wt Readings from Last 3 Encounters:  12/19/23 159 lb 13.3 oz (72.5 kg)  06/29/23 160 lb (72.6 kg)  04/14/23 163 lb 9.6 oz (74.2 kg)   Physical Exam Constitutional:      Appearance: Normal appearance. She is obese.   Cardiovascular:     Heart sounds: Normal heart sounds.  Pulmonary:     Breath sounds: Normal breath sounds.  Chest:     Comments: Right lumpectomy scar within normal limits.  No discrete nodules, masses, or lymphadenopathy in either breast or surrounding tissue.  Neurological:     General: No focal deficit present.     Mental Status: Mental status is at baseline.   Psychiatric:        Behavior: Behavior normal. Behavior is cooperative.      PAST MEDICAL/SURGICAL HISTORY:  Past Medical History:  Diagnosis Date   Allergy    Anxiety 07/07/2011   takes Ativan  every 4hrs   Arthritis    back    Asthma    uses inhaler prn   Breast cancer (HCC)    right - local excision, chemotheraphy   Cataract    right eye   Chronic back pain    Depression    has appt to discuss depression 07/26/2011   Diverticulosis    Dizziness    occasionally   Gastric ulcer    GERD (gastroesophageal reflux disease)    takes Nexium daily   Hemorrhoids    History of nuclear stress test 11/2010   lexiscan ; no evidence of inducible ischemia; normal pattern of perfusion    History of shingles 2011   Hx of colonic polyps    Hyperlipidemia     takes Zocor daily   Hypertension    takes Bystolic  and Losartan daily   Hypothyroidism    takes Synthroid  daily   Nausea alone    onset last Wednesday  08/04/11   Nocturia    Osteoporosis    PONV (postoperative nausea and vomiting)    PVC's (premature ventricular contractions)    Shortness of breath    with exertion   Skin rash    Urinary frequency    Urticaria of unknown origin    Past Surgical History:  Procedure Laterality Date   ABDOMINAL HYSTERECTOMY  2001   BREAST LUMPECTOMY Right 07/02/11   right breast and SNL   BREAST SURGERY Left 1972   left lumpectomy   BREAST SURGERY Right 08/11/11   margins on rt breast   CATARACT EXTRACTION W/PHACO Right 08/31/2016   Procedure: CATARACT EXTRACTION PHACO AND INTRAOCULAR LENS PLACEMENT (IOC);  Surgeon: Albert Huff, MD;  Location: [facility];  Service: Ophthalmology;  Laterality: Right;  CDE: 8.46   CATARACT EXTRACTION W/PHACO Left 09/14/2016   Procedure: CATARACT EXTRACTION PHACO AND INTRAOCULAR LENS PLACEMENT (IOC);  Surgeon: Albert Huff, MD;  Location: [facility];  Service: Ophthalmology;  Laterality: Left;  CDE: 9.32   COLONOSCOPY     colonscopy     HEMORRHOID SURGERY     LEFT HEART CATH AND CORONARY ANGIOGRAPHY N/A 12/17/2021   Procedure: LEFT HEART CATH AND CORONARY ANGIOGRAPHY;  Surgeon: Millicent Ally, MD;  Location: [facility];  Service: Cardiovascular;  Laterality: N/A;   TRANSTHORACIC ECHOCARDIOGRAM  2009   normal LV & RV systolic function; mild mitral annular calcif & mild MR; trace TR    SOCIAL HISTORY:  Social History   Socioeconomic History   Marital status: Married    Spouse name: Not on file   Number of children: Not on file   Years of education: 10    Highest education level: Not on file  Occupational History   Occupation: retired    Associate Professor: RETIRED  Tobacco Use   Smoking status: Never    Passive exposure: Past   Smokeless tobacco: Never  Vaping Use   Vaping status: Never Used  Substance and Sexual  Activity   Alcohol use: No   Drug use: No   Sexual activity: Yes    Birth control/protection: Surgical  Other Topics Concern   Not on file  Social History Narrative   Married with three children   Royston Cornea- age 23, Edwina Gram age 10 and [place] age 71- all reside in [place]   Social Drivers of Corporate investment banker Strain: Not on file  Food Insecurity: No Food Insecurity (06/29/2023)   Hunger Vital Sign    Worried About Running Out of Food in the Last Year: Never true    Ran Out of Food in the Last Year: Never true  Transportation Needs: No Transportation Needs (06/29/2023)   PRAPARE - Administrator, Civil Service (Medical): No    Lack of Transportation (Non-Medical): No  Physical Activity: Not on file  Stress: Not on file  Social Connections: Not on file  Intimate Partner Violence: Not At Risk (06/29/2023)   Humiliation, Afraid, Rape, and Kick questionnaire    Fear of Current or Ex-Partner: No    Emotionally Abused: No    Physically Abused: No    Sexually Abused: No    FAMILY HISTORY:  Family History  Problem Relation Age of Onset   Colon cancer Mother        also stroke   Pancreatic cancer Father        also heart disease   Diabetes Sister    Hypertension Sister    Anesthesia problems Neg Hx    Hypotension Neg Hx    Malignant hyperthermia Neg Hx    Pseudochol deficiency Neg Hx    Stomach cancer Neg Hx    Rectal cancer Neg Hx     CURRENT MEDICATIONS:  Current Outpatient Medications  Medication Sig Dispense Refill   amLODipine  (NORVASC ) 5 MG tablet TAKE 1 TABLET(5 MG) BY MOUTH DAILY 90 tablet 0   carvedilol  (COREG ) 12.5 MG tablet TAKE 1 TABLET(12.5 MG) BY MOUTH TWICE DAILY 60 tablet 0   citalopram  (CELEXA ) 20 MG tablet Take 20 mg by mouth daily.     famotidine (PEPCID) 20 MG tablet Take 40 mg by mouth daily.     gabapentin (NEURONTIN) 100 MG capsule Take 100 mg by mouth 3 (three) times daily.     levothyroxine  (  SYNTHROID , LEVOTHROID) 50 MCG  tablet Take 50 mcg by mouth daily.     LORazepam  (ATIVAN ) 1 MG tablet Take 1 mg by mouth at bedtime. For anxiety may take extra tablet if needed     nitroGLYCERIN  (NITROSTAT ) 0.4 MG SL tablet Place 1 tablet (0.4 mg total) under the tongue every 5 (five) minutes as needed for chest pain. 100 tablet 0   omeprazole  (PRILOSEC) 40 MG capsule TAKE 1 CAPSULE BY MOUTH SHORTLY BEFORE BREAKFAST DAILY. (Patient taking differently: Take 40 mg by mouth daily. TAKE 1 CAPSULE BY MOUTH SHORTLY BEFORE BREAKFAST DAILY.) 90 capsule 0   psyllium (METAMUCIL) 58.6 % powder Take 1 packet by mouth daily as needed (constipation).     rosuvastatin  (CRESTOR ) 40 MG tablet TAKE 1 TABLET(40 MG) BY MOUTH DAILY 90 tablet 0   Spacer/Aero-Holding Chambers (AEROCHAMBER PLUS) inhaler Use with inhaler 1 each 2   Current Facility-Administered Medications  Medication Dose Route Frequency Provider Last Rate Last Admin   0.9 %  sodium chloride  infusion  500 mL Intravenous Once Janel Medford, MD        ALLERGIES:  Allergies  Allergen Reactions   Bisoprolol-Hydrochlorothiazide Other (See Comments)    Unknown   Hydrocodone Other (See Comments)    Unknown   Lipitor  [Atorvastatin ] Itching   Lisinopril Itching   Oxycodone  Itching   Oxycodone  Hcl Itching   Ziac [Bisoprolol-Hydrochlorothiazide] Other (See Comments)    Unknown Patient taking hydrochlorothiazide with no reaction.   Penicillins Rash         LABORATORY DATA:  I have reviewed the labs as listed.     Latest Ref Rng & Units 07/01/2023    4:19 AM 06/30/2023    4:33 AM 06/29/2023    2:25 PM  CBC  WBC 4.0 - 10.5 K/uL 5.3  6.3  8.0   Hemoglobin 12.0 - 15.0 g/dL 60.4  9.7  54.0   Hematocrit 36.0 - 46.0 % 33.8  31.6  34.6   Platelets 150 - 400 K/uL 227  203  241       Latest Ref Rng & Units 07/01/2023    4:19 AM 06/30/2023    4:33 AM 06/29/2023    2:25 PM  CMP  Glucose 70 - 99 mg/dL 981  88  97   BUN 8 - 23 mg/dL 14  14  13    Creatinine 0.44 - 1.00 mg/dL  1.91  4.78  2.95   Sodium 135 - 145 mmol/L 136  138  133   Potassium 3.5 - 5.1 mmol/L 3.8  3.2  3.3   Chloride 98 - 111 mmol/L 103  102  97   CO2 22 - 32 mmol/L 25  25  26    Calcium  8.9 - 10.3 mg/dL 8.3  8.1  8.7   Total Protein 6.5 - 8.1 g/dL   6.7   Total Bilirubin <1.2 mg/dL   0.6   Alkaline Phos 38 - 126 U/L   48   AST 15 - 41 U/L   20   ALT 0 - 44 U/L   14     DIAGNOSTIC IMAGING:  I have independently reviewed the scans and discussed with the patient. No results found.   WRAP UP:  All questions were answered. The patient knows to call the clinic with any problems, questions or concerns.  Medical decision making: Moderate  Time spent on visit: I spent 20 minutes counseling the patient face to face. The total time spent in the appointment was 30  minutes and more than 50% was on counseling.  Sonnie Dusky, PA-C  12/19/23 11:49 AM

## 2023-12-19 ENCOUNTER — Inpatient Hospital Stay

## 2023-12-19 ENCOUNTER — Inpatient Hospital Stay: Payer: Medicare PPO | Attending: Physician Assistant | Admitting: Physician Assistant

## 2023-12-19 VITALS — BP 127/88 | HR 83 | Temp 98.4°F | Resp 18 | Wt 159.8 lb

## 2023-12-19 DIAGNOSIS — Z853 Personal history of malignant neoplasm of breast: Secondary | ICD-10-CM | POA: Diagnosis not present

## 2023-12-19 DIAGNOSIS — Z1231 Encounter for screening mammogram for malignant neoplasm of breast: Secondary | ICD-10-CM

## 2023-12-19 DIAGNOSIS — Z9223 Personal history of estrogen therapy: Secondary | ICD-10-CM | POA: Diagnosis not present

## 2023-12-19 DIAGNOSIS — Z170 Estrogen receptor positive status [ER+]: Secondary | ICD-10-CM | POA: Diagnosis not present

## 2023-12-19 DIAGNOSIS — C50411 Malignant neoplasm of upper-outer quadrant of right female breast: Secondary | ICD-10-CM | POA: Diagnosis not present

## 2023-12-19 NOTE — Patient Instructions (Signed)
 [facility] at [facility] **VISIT SUMMARY & IMPORTANT INSTRUCTIONS **   You were seen today by Sheril Dines PA-C for your history of right-sided breast cancer.   Your most recent labs and physical exam today did not show any evidence of recurrent breast cancer.  We will reschedule your mammogram that was missed in June. You will need annual follow-up for breast exam and mammogram.  This can be with your primary care office or the Cancer Center.  FOLLOW-UP APPOINTMENT: Office visit in 1 year   ** Thank you for trusting me with your healthcare!  I strive to provide all of my patients with quality care at each visit.  If you receive a survey for this visit, I would be so grateful to you for taking the time to provide feedback.  Thank you in advance!  ~ Noretta Frier                   Dr. Paulett Boros   &   Sheril Dines, PA-C   - - - - - - - - - - - - - - - - - -    Thank you for choosing [facility] at [facility] to provide your oncology and hematology care.  To afford each patient quality time with our provider, please arrive at least 15 minutes before your scheduled appointment time.   If you have a lab appointment with the Cancer Center please come in thru the Main Entrance and check in at the main information desk.  You need to re-schedule your appointment should you arrive 10 or more minutes late.  We strive to give you quality time with our providers, and arriving late affects you and other patients whose appointments are after yours.  Also, if you no show three or more times for appointments you may be dismissed from the clinic at the providers discretion.     Again, thank you for choosing [facility].  Our hope is that these requests will decrease the amount of time that you wait before being seen by our physicians.       _____________________________________________________________  Should you have questions after  your visit to [facility], please contact our office at [phone] and follow the prompts.  Our office hours are 8:00 a.m. and 4:30 p.m. Monday - Friday.  Please note that voicemails left after 4:00 p.m. may not be returned until the following business day.  We are closed weekends and major holidays.  You do have access to a nurse 24-7, just call the main number to the clinic [phone] and do not press any options, hold on the line and a nurse will answer the phone.    For prescription refill requests, have your pharmacy contact our office and allow 72 hours.

## 2023-12-21 ENCOUNTER — Ambulatory Visit (HOSPITAL_COMMUNITY)
Admission: RE | Admit: 2023-12-21 | Discharge: 2023-12-21 | Disposition: A | Discharge status: Home / Self Care | Source: Ambulatory Visit | Attending: Physician Assistant | Admitting: Physician Assistant

## 2023-12-21 DIAGNOSIS — Z1231 Encounter for screening mammogram for malignant neoplasm of breast: Secondary | ICD-10-CM | POA: Insufficient documentation

## 2024-01-04 ENCOUNTER — Encounter: Payer: Self-pay | Admitting: Physician Assistant

## 2024-01-20 ENCOUNTER — Ambulatory Visit: Admitting: Internal Medicine

## 2024-01-23 DIAGNOSIS — R809 Proteinuria, unspecified: Secondary | ICD-10-CM | POA: Diagnosis not present

## 2024-01-23 DIAGNOSIS — D631 Anemia in chronic kidney disease: Secondary | ICD-10-CM | POA: Diagnosis not present

## 2024-01-23 DIAGNOSIS — E559 Vitamin D deficiency, unspecified: Secondary | ICD-10-CM | POA: Diagnosis not present

## 2024-01-23 DIAGNOSIS — E211 Secondary hyperparathyroidism, not elsewhere classified: Secondary | ICD-10-CM | POA: Diagnosis not present

## 2024-01-23 DIAGNOSIS — N189 Chronic kidney disease, unspecified: Secondary | ICD-10-CM | POA: Diagnosis not present

## 2024-01-26 ENCOUNTER — Ambulatory Visit: Payer: Self-pay

## 2024-01-26 NOTE — Telephone Encounter (Signed)
   Message from [place] sent at 01/26/2024 12:42 PM EDT  Summary: Rash   Patient states she has a rash on both her legs, all the way around. Hot to touch. Appeared yesterday after she mowed the yard. Also has been having chills. Patient is seeing her Kidney Dr Rachele tomorrow. Would like to establish at [facility].  Patient can be reached at [phone]      Reason for Disposition  Requesting regular office appointment    Establish care appointment  Answer Assessment - Initial Assessment Questions 1. REASON FOR CALL: What is the main reason for your call? or How can I best help you?     Establish care 2. SYMPTOMS : Do you have any symptoms?      Bilateral leg rash-declines triage, she has an appointment tomorrow with her specialist and plans on asking for advise then, if they are unable to evaluate she will plan on seeking evaluation at urgent care.  Protocols used: Information Only Call - No Triage-A-AH

## 2024-01-27 DIAGNOSIS — N1832 Chronic kidney disease, stage 3b: Secondary | ICD-10-CM | POA: Diagnosis not present

## 2024-01-27 DIAGNOSIS — I5032 Chronic diastolic (congestive) heart failure: Secondary | ICD-10-CM | POA: Diagnosis not present

## 2024-01-27 DIAGNOSIS — G4733 Obstructive sleep apnea (adult) (pediatric): Secondary | ICD-10-CM | POA: Diagnosis not present

## 2024-01-27 DIAGNOSIS — I129 Hypertensive chronic kidney disease with stage 1 through stage 4 chronic kidney disease, or unspecified chronic kidney disease: Secondary | ICD-10-CM | POA: Diagnosis not present

## 2024-02-01 ENCOUNTER — Other Ambulatory Visit: Payer: Self-pay | Admitting: Internal Medicine

## 2024-02-28 ENCOUNTER — Ambulatory Visit: Admitting: Physician Assistant

## 2024-02-28 ENCOUNTER — Encounter: Payer: Self-pay | Admitting: Physician Assistant

## 2024-02-28 VITALS — BP 130/80 | HR 88 | Ht 60.0 in | Wt 159.4 lb

## 2024-02-28 DIAGNOSIS — Z800 Family history of malignant neoplasm of digestive organs: Secondary | ICD-10-CM

## 2024-02-28 DIAGNOSIS — Z860101 Personal history of adenomatous and serrated colon polyps: Secondary | ICD-10-CM

## 2024-02-28 DIAGNOSIS — Z01818 Encounter for other preprocedural examination: Secondary | ICD-10-CM

## 2024-02-28 NOTE — Progress Notes (Signed)
 Chief Complaint: Discuss colonoscopy  HPI:    Jane Wilkerson is an 83 year old Caucasian female with a past medical history as listed below including CKD stage III breast cancer, GERD, echo 12/16/2021 with LVEF 55-60%, previously noted Dr. Teressa, who was referred to me by Bertell Satterfield, MD for question of a colonoscopy.      09/03/2020 colonoscopy with Dr. Teressa for screening given family history of first-degree relative with colorectal cancer, and her mother, last colonoscopy 2011 with a single HP at that time six 2-7 mm polyps in the sigmoid, descending, transverse and ascending colon as well as diverticulosis in left colon external and internal hemorrhoids.  Pathology showed mixture of tubular adenomas, sessile serrated and HP's.  At that time letter was sent out the patient have a repeat colonoscopy in 3 years.    07/01/2023 CBC with a hemoglobin of 10.5, MCV normal.  BMP with glucose 139, creatinine 1.2 (baseline)    Today, the patient presents to clinic accompanied by a family member.  She tells me that she knows she is due for another colonoscopy but she really does not want to have another one given her age.  She now has CPAP at night for her sleep apnea and is on medications for high cholesterol and high blood pressure.  She is not having any acute GI complaints or concerns.  Also describes a vague history with some trouble with anesthesia.  Describes a time a few years ago when she had some days of constipation that required a suppository but nothing has happened recently.  No acute GI complaints or concerns.  Also discusses that if she was diagnosed with colon cancer she is not sure she would want to undergo treatment for that at her age.    Denies fever, chills or weight loss.  Past Medical History:  Diagnosis Date   Allergy    Anxiety 07/07/2011   takes Ativan  every 4hrs   Arthritis    back    Asthma    uses inhaler prn   Breast cancer (HCC)    right - local excision, chemotheraphy    Cataract    right eye   Chronic back pain    Depression    has appt to discuss depression 07/26/2011   Diverticulosis    Dizziness    occasionally   Gastric ulcer    GERD (gastroesophageal reflux disease)    takes Nexium daily   Hemorrhoids    History of nuclear stress test 11/2010   lexiscan ; no evidence of inducible ischemia; normal pattern of perfusion    History of shingles 2011   Hx of colonic polyps    Hyperlipidemia    takes Zocor daily   Hypertension    takes Bystolic  and Losartan daily   Hypothyroidism    takes Synthroid  daily   Nausea alone    onset last Wednesday  08/04/11   Nocturia    Osteoporosis    PONV (postoperative nausea and vomiting)    PVC's (premature ventricular contractions)    Shortness of breath    with exertion   Skin rash    Urinary frequency    Urticaria of unknown origin     Past Surgical History:  Procedure Laterality Date   ABDOMINAL HYSTERECTOMY  2001   BREAST LUMPECTOMY Right 07/02/11   right breast and SNL   BREAST SURGERY Left 1972   left lumpectomy   BREAST SURGERY Right 08/11/11   margins on rt breast   CATARACT EXTRACTION W/PHACO  Right 08/31/2016   Procedure: CATARACT EXTRACTION PHACO AND INTRAOCULAR LENS PLACEMENT (IOC);  Surgeon: Oneil Platts, MD;  Location: [facility];  Service: Ophthalmology;  Laterality: Right;  CDE: 8.46   CATARACT EXTRACTION W/PHACO Left 09/14/2016   Procedure: CATARACT EXTRACTION PHACO AND INTRAOCULAR LENS PLACEMENT (IOC);  Surgeon: Oneil Platts, MD;  Location: [facility];  Service: Ophthalmology;  Laterality: Left;  CDE: 9.32   COLONOSCOPY     colonscopy     HEMORRHOID SURGERY     LEFT HEART CATH AND CORONARY ANGIOGRAPHY N/A 12/17/2021   Procedure: LEFT HEART CATH AND CORONARY ANGIOGRAPHY;  Surgeon: Burnard Debby LABOR, MD;  Location: [facility];  Service: Cardiovascular;  Laterality: N/A;   TRANSTHORACIC ECHOCARDIOGRAM  2009   normal LV & RV systolic function; mild mitral annular calcif & mild MR; trace TR     Current Outpatient Medications  Medication Sig Dispense Refill   amLODipine  (NORVASC ) 5 MG tablet TAKE 1 TABLET(5 MG) BY MOUTH DAILY 90 tablet 0   carvedilol  (COREG ) 12.5 MG tablet TAKE 1 TABLET(12.5 MG) BY MOUTH TWICE DAILY 60 tablet 0   citalopram  (CELEXA ) 20 MG tablet Take 20 mg by mouth daily.     famotidine (PEPCID) 20 MG tablet Take 40 mg by mouth daily.     gabapentin (NEURONTIN) 100 MG capsule Take 100 mg by mouth 3 (three) times daily.     levothyroxine  (SYNTHROID , LEVOTHROID) 50 MCG tablet Take 50 mcg by mouth daily.     LORazepam  (ATIVAN ) 1 MG tablet Take 1 mg by mouth at bedtime. For anxiety may take extra tablet if needed     nitroGLYCERIN  (NITROSTAT ) 0.4 MG SL tablet Place 1 tablet (0.4 mg total) under the tongue every 5 (five) minutes as needed for chest pain. 100 tablet 0   omeprazole  (PRILOSEC) 40 MG capsule TAKE 1 CAPSULE BY MOUTH SHORTLY BEFORE BREAKFAST DAILY. (Patient taking differently: Take 40 mg by mouth daily. TAKE 1 CAPSULE BY MOUTH SHORTLY BEFORE BREAKFAST DAILY.) 90 capsule 0   psyllium (METAMUCIL) 58.6 % powder Take 1 packet by mouth daily as needed (constipation).     rosuvastatin  (CRESTOR ) 40 MG tablet TAKE 1 TABLET(40 MG) BY MOUTH DAILY 90 tablet 0   Spacer/Aero-Holding Chambers (AEROCHAMBER PLUS) inhaler Use with inhaler 1 each 2   Current Facility-Administered Medications  Medication Dose Route Frequency Provider Last Rate Last Admin   0.9 %  sodium chloride  infusion  500 mL Intravenous Once Teressa Toribio SQUIBB, MD        Allergies as of 02/28/2024 - Review Complete 12/19/2023  Allergen Reaction Noted   Bisoprolol-hydrochlorothiazide Other (See Comments) 06/24/2011   Hydrocodone Other (See Comments) 06/24/2011   Lipitor  [atorvastatin ] Itching 04/05/2022   Lisinopril Itching 01/27/2022   Oxycodone  Itching 08/04/2011   Oxycodone  hcl Itching 08/04/2011   Ziac [bisoprolol-hydrochlorothiazide] Other (See Comments) 06/24/2011   Penicillins Rash 06/16/2011     Family History  Problem Relation Age of Onset   Colon cancer Mother        also stroke   Pancreatic cancer Father        also heart disease   Diabetes Sister    Hypertension Sister    Anesthesia problems Neg Hx    Hypotension Neg Hx    Malignant hyperthermia Neg Hx    Pseudochol deficiency Neg Hx    Stomach cancer Neg Hx    Rectal cancer Neg Hx     Social History   Socioeconomic History   Marital status: Married  Spouse name: Not on file   Number of children: Not on file   Years of education: 10    Highest education level: Not on file  Occupational History   Occupation: retired    Associate Professor: RETIRED  Tobacco Use   Smoking status: Never    Passive exposure: Past   Smokeless tobacco: Never  Vaping Use   Vaping status: Never Used  Substance and Sexual Activity   Alcohol use: No   Drug use: No   Sexual activity: Yes    Birth control/protection: Surgical  Other Topics Concern   Not on file  Social History Narrative   Married with three children   Lynwood- age 65, Olam age 62 and [place] age 77- all reside in [place]   Social Drivers of Corporate investment banker Strain: Not on file  Food Insecurity: No Food Insecurity (06/29/2023)   Hunger Vital Sign    Worried About Running Out of Food in the Last Year: Never true    Ran Out of Food in the Last Year: Never true  Transportation Needs: No Transportation Needs (06/29/2023)   PRAPARE - Administrator, Civil Service (Medical): No    Lack of Transportation (Non-Medical): No  Physical Activity: Not on file  Stress: Not on file  Social Connections: Not on file  Intimate Partner Violence: Not At Risk (06/29/2023)   Humiliation, Afraid, Rape, and Kick questionnaire    Fear of Current or Ex-Partner: No    Emotionally Abused: No    Physically Abused: No    Sexually Abused: No    Review of Systems:    Constitutional: No weight loss, fever or chills Skin: No rash Cardiovascular: No chest pain   Respiratory: No SOB  Gastrointestinal: See HPI and otherwise negative Genitourinary: No dysuria  Neurological: No headache, dizziness or syncope Musculoskeletal: No new muscle or joint pain Hematologic: No bleeding  Psychiatric: No history of depression or anxiety   Physical Exam:  Vital signs: BP 130/80 (BP Location: Left Arm, Patient Position: Sitting, Cuff Size: Normal)   Pulse 88   Ht 5' (1.524 m) Comment: height measured without shoes  Wt 159 lb 6 oz (72.3 kg)   BMI 31.13 kg/m    Constitutional:   Pleasant elderly Caucasian female appears to be in NAD, Well developed, Well nourished, alert and cooperative Head:  Normocephalic and atraumatic. Eyes:   PEERL, EOMI. No icterus. Conjunctiva pink. Ears:  Normal auditory acuity. Neck:  Supple Throat: Oral cavity and pharynx without inflammation, swelling or lesion.  Respiratory: Respirations even and unlabored. Lungs clear to auscultation bilaterally.   No wheezes, crackles, or rhonchi.  Cardiovascular: Normal S1, S2. No MRG. Regular rate and rhythm. No peripheral edema, cyanosis or pallor.  Gastrointestinal:  Soft, nondistended, nontender. No rebound or guarding. Normal bowel sounds. No appreciable masses or hepatomegaly. Rectal:  Not performed.  Msk:  Symmetrical without gross deformities. Without edema, no deformity or joint abnormality.  Neurologic:  Alert and  oriented x4;  grossly normal neurologically.  Skin:   Dry and intact without significant lesions or rashes. Psychiatric: Demonstrates good judgement and reason without abnormal affect or behaviors.  No recent labs or imaging in our system.  Assessment: 1.  History of adenomatous polyps and a family history of colon cancer: Family history of colon cancer in her mother, last colonoscopy in 23 with recommendations to consider repeat in 3 years given 6 polyps which were a mixture of tubular adenoma, sessile serrated and hyperplastic polyps,  patient is now 83 years old and  would prefer not to have another screening colonoscopy, no acute GI complaints or concerns  Plan: 1.  We discussed the risks and benefits of having another colonoscopy at her age with her compounding chronic medical problems.  Patient would prefer not to have another colonoscopy.  Also had discussion about if she would undergo treatment for colon cancer if she was found to have it.  She is not sure that she would and would like to not have any further screenings. 2.  Patient to follow-up in our clinic as needed.  Discussed that if she has a change in bowel habits, unexplained weight loss or rectal bleeding then those would all be reasons to pursue another colonoscopy for diagnostic reasons. Assigned to Dr. Nandigam today.  Delon Failing, PA-C [facility] 02/28/2024, 1:21 PM  Cc: Bertell Satterfield, MD

## 2024-03-01 DIAGNOSIS — G4733 Obstructive sleep apnea (adult) (pediatric): Secondary | ICD-10-CM | POA: Diagnosis not present

## 2024-03-09 ENCOUNTER — Encounter: Payer: Self-pay | Admitting: Nurse Practitioner

## 2024-03-09 ENCOUNTER — Ambulatory Visit: Payer: Self-pay | Admitting: Nurse Practitioner

## 2024-03-09 VITALS — BP 107/65 | HR 86 | Ht 60.0 in | Wt 159.0 lb

## 2024-03-09 DIAGNOSIS — I10 Essential (primary) hypertension: Secondary | ICD-10-CM

## 2024-03-09 DIAGNOSIS — G5601 Carpal tunnel syndrome, right upper limb: Secondary | ICD-10-CM | POA: Diagnosis not present

## 2024-03-09 DIAGNOSIS — G4733 Obstructive sleep apnea (adult) (pediatric): Secondary | ICD-10-CM

## 2024-03-09 DIAGNOSIS — F419 Anxiety disorder, unspecified: Secondary | ICD-10-CM

## 2024-03-09 DIAGNOSIS — N1831 Chronic kidney disease, stage 3a: Secondary | ICD-10-CM | POA: Diagnosis not present

## 2024-03-09 DIAGNOSIS — G8929 Other chronic pain: Secondary | ICD-10-CM

## 2024-03-09 MED ORDER — GABAPENTIN 100 MG PO CAPS: 100.0000 mg | ORAL_CAPSULE | Freq: Three times a day (TID) | ORAL | 3 refills | Status: AC

## 2024-03-09 NOTE — Progress Notes (Signed)
 New Patient Office Visit  Subjective    Patient ID: Jane Wilkerson, female    DOB: 24-Jun-1941  Age: 83 y.o. MRN: [id number]  CC:  Chief Complaint  Patient presents with   Establish Care    HPI Jane Wilkerson presents to establish care Patient here today as a new patient.  Patient reports hx of anxiety/depression, reports her husband recently had a stroke.  On citalopram  and lorazepam , feels well controlled.  On sleep apnea machine, but lies down flat so recommend wedge pillow.  Last labs were in May of this year.  Patient agrees to fasting labs at next appt.  She reports she is not recommended for colonoscopy by GI provider.  Hx of breast cancer and afib.  Has cardiology appt in the next 1-2 months.    Outpatient Encounter Medications as of 03/09/2024  Medication Sig   amLODipine  (NORVASC ) 5 MG tablet TAKE 1 TABLET(5 MG) BY MOUTH DAILY   carvedilol  (COREG ) 12.5 MG tablet TAKE 1 TABLET(12.5 MG) BY MOUTH TWICE DAILY   citalopram  (CELEXA ) 20 MG tablet Take 20 mg by mouth daily.   gabapentin  (NEURONTIN ) 100 MG capsule Take 100 mg by mouth 3 (three) times daily.   hydrochlorothiazide (HYDRODIURIL) 12.5 MG tablet Take 12.5 mg by mouth daily.   levothyroxine  (SYNTHROID , LEVOTHROID) 50 MCG tablet Take 50 mcg by mouth daily.   LORazepam  (ATIVAN ) 1 MG tablet Take 1 mg by mouth at bedtime. For anxiety may take extra tablet if needed   nitroGLYCERIN  (NITROSTAT ) 0.4 MG SL tablet Place 1 tablet (0.4 mg total) under the tongue every 5 (five) minutes as needed for chest pain.   omeprazole  (PRILOSEC) 40 MG capsule TAKE 1 CAPSULE BY MOUTH SHORTLY BEFORE BREAKFAST DAILY.   rosuvastatin  (CRESTOR ) 40 MG tablet TAKE 1 TABLET(40 MG) BY MOUTH DAILY   famotidine (PEPCID) 20 MG tablet Take 40 mg by mouth daily. (Patient not taking: Reported on 03/09/2024)   psyllium (METAMUCIL) 58.6 % powder Take 1 packet by mouth daily as needed (constipation).   Spacer/Aero-Holding Chambers (AEROCHAMBER PLUS) inhaler Use with  inhaler   Facility-Administered Encounter Medications as of 03/09/2024  Medication   0.9 %  sodium chloride  infusion    Past Medical History:  Diagnosis Date   Allergy    Anxiety 07/07/2011   takes Ativan  every 4hrs   Arthritis    back    Asthma    uses inhaler prn   Breast cancer (HCC)    right - local excision, chemotheraphy   Cataract    right eye   Chronic back pain    Depression    has appt to discuss depression 07/26/2011   Diverticulosis    Dizziness    occasionally   Gastric ulcer    GERD (gastroesophageal reflux disease)    takes Nexium daily   Hemorrhoids    History of nuclear stress test 11/2010   lexiscan ; no evidence of inducible ischemia; normal pattern of perfusion    History of shingles 2011   Hx of colonic polyps    Hyperlipidemia    takes Zocor daily   Hypertension    takes Bystolic  and Losartan daily   Hypothyroidism    takes Synthroid  daily   Nausea alone    onset last Wednesday  08/04/11   Nocturia    Osteoporosis    PONV (postoperative nausea and vomiting)    PVC's (premature ventricular contractions)    Shortness of breath    with exertion   Skin rash  Urinary frequency    Urticaria of unknown origin     Past Surgical History:  Procedure Laterality Date   ABDOMINAL HYSTERECTOMY  2001   BREAST LUMPECTOMY Right 07/02/2011   right breast and SNL   BREAST SURGERY Left 1972   left lumpectomy   BREAST SURGERY Right 08/11/2011   margins on rt breast   CATARACT EXTRACTION W/PHACO Right 08/31/2016   Procedure: CATARACT EXTRACTION PHACO AND INTRAOCULAR LENS PLACEMENT (IOC);  Surgeon: Oneil Platts, MD;  Location: [facility];  Service: Ophthalmology;  Laterality: Right;  CDE: 8.46   CATARACT EXTRACTION W/PHACO Left 09/14/2016   Procedure: CATARACT EXTRACTION PHACO AND INTRAOCULAR LENS PLACEMENT (IOC);  Surgeon: Oneil Platts, MD;  Location: [facility];  Service: Ophthalmology;  Laterality: Left;  CDE: 9.32   COLONOSCOPY     HEMORRHOID SURGERY     LEFT  HEART CATH AND CORONARY ANGIOGRAPHY N/A 12/17/2021   Procedure: LEFT HEART CATH AND CORONARY ANGIOGRAPHY;  Surgeon: Burnard Debby LABOR, MD;  Location: [facility];  Service: Cardiovascular;  Laterality: N/A;   TRANSTHORACIC ECHOCARDIOGRAM  2009   normal LV & RV systolic function; mild mitral annular calcif & mild MR; trace TR    Family History  Problem Relation Age of Onset   Colon cancer Mother    Stroke Mother    Diabetes Mother    Pancreatic cancer Father    Heart disease Father    Heart attack Father    Hypertension Father    Cirrhosis Sister    Kidney disease Sister    Lung cancer Sister    Other Brother        died at 45 months old   Hyperlipidemia Daughter    Gallbladder disease Daughter    Anxiety disorder Daughter    Diabetes Daughter    Thyroid  disease Daughter    Heart attack Son    Hypertension Son    Deafness Son    Anesthesia problems Neg Hx    Hypotension Neg Hx    Malignant hyperthermia Neg Hx    Pseudochol deficiency Neg Hx    Stomach cancer Neg Hx    Rectal cancer Neg Hx     Social History   Socioeconomic History   Marital status: Married    Spouse name: Not on file   Number of children: 3   Years of education: 10    Highest education level: Not on file  Occupational History   Occupation: retired    Associate Professor: RETIRED  Tobacco Use   Smoking status: Never    Passive exposure: Past   Smokeless tobacco: Never  Vaping Use   Vaping status: Never Used  Substance and Sexual Activity   Alcohol use: No   Drug use: No   Sexual activity: Yes    Birth control/protection: Surgical  Other Topics Concern   Not on file  Social History Narrative   Married with three children   Lynwood- age 49, Olam age 60 and [place] age 55- all reside in [place]   Social Drivers of Corporate investment banker Strain: Not on file  Food Insecurity: No Food Insecurity (06/29/2023)   Hunger Vital Sign    Worried About Running Out of Food in the Last Year: Never  true    Ran Out of Food in the Last Year: Never true  Transportation Needs: No Transportation Needs (06/29/2023)   PRAPARE - Administrator, Civil Service (Medical): No    Lack of Transportation (Non-Medical): No  Physical Activity: Not on file  Stress: Not on file  Social Connections: Not on file  Intimate Partner Violence: Not At Risk (06/29/2023)   Humiliation, Afraid, Rape, and Kick questionnaire    Fear of Current or Ex-Partner: No    Emotionally Abused: No    Physically Abused: No    Sexually Abused: No    ROS      Objective    BP 107/65   Pulse 86   Ht 5' (1.524 m)   Wt 159 lb (72.1 kg)   SpO2 94%   BMI 31.05 kg/m   Physical Exam Vitals and nursing note reviewed.  Constitutional:      Appearance: Normal appearance.  HENT:     Head: Normocephalic.     Nose: Nose normal.     Mouth/Throat:     Mouth: Mucous membranes are moist.  Cardiovascular:     Rate and Rhythm: Normal rate and regular rhythm.     Pulses: Normal pulses.     Heart sounds: Normal heart sounds.  Pulmonary:     Effort: Pulmonary effort is normal.     Breath sounds: Normal breath sounds.  Musculoskeletal:        General: Normal range of motion.     Cervical back: Normal range of motion and neck supple.  Skin:    General: Skin is warm and dry.  Neurological:     Mental Status: She is alert and oriented to person, place, and time.  Psychiatric:        Mood and Affect: Mood normal.        Behavior: Behavior normal.         Assessment & Plan:  1) OSA - CPAP and wedge pillow 2) Afib/HTN - follow up with cardiologist in next two months 3) Refill today - gabapentin   4) Fasting labs at next appt   Problem List Items Addressed This Visit   None   No follow-ups on file.   Neale Carpen, NP

## 2024-03-09 NOTE — Patient Instructions (Signed)
 1) OSA - CPAP and wedge pillow 2) Afib/HTN - follow up with cardiologist in next two months 3) Refill today - gabapentin   4) Fasting labs at next appt

## 2024-04-09 ENCOUNTER — Encounter: Payer: Self-pay | Admitting: Internal Medicine

## 2024-04-09 ENCOUNTER — Ambulatory Visit: Attending: Internal Medicine | Admitting: Internal Medicine

## 2024-04-09 VITALS — BP 130/80 | HR 91 | Ht 62.0 in | Wt 163.2 lb

## 2024-04-09 DIAGNOSIS — I10 Essential (primary) hypertension: Secondary | ICD-10-CM

## 2024-04-09 NOTE — Progress Notes (Signed)
 Cardiology Office Note  Date: 04/09/2024   ID: Jane Wilkerson, DOB 01/05/1941, MRN [id number]  PCP:  Glennon Sand, NP (Inactive)  Cardiologist:  Diannah SHAUNNA Maywood, MD Electrophysiologist:  None   Reason for Office Visit: Follow-up of SVT/nonobstructive CAD   History of Present Illness: Jane Wilkerson is a 83 y.o. female known to have nonobstructive CAD manifested by NSTEMI in the setting of SVT in 12/2021 with normal LVEF, SVT in 6/23, HTN, hypothyroidism, thoracic aneurysm is here for follow-up visit.  Diagnosed with severe OSA, on CPAP now.  Patient is here for follow-up visit with me. No interval ER visits or hospitalizations. Patient denied any rest or exertional chest discomfort, tightness, heaviness or pressure, rest or exertional dyspnea, palpitations, light-headedness, syncope and LE swelling. Compliant with medications and no side-effects.    Past Medical History:  Diagnosis Date   Allergy    Anxiety 07/07/2011   takes Ativan  every 4hrs   Arthritis    back    Asthma    uses inhaler prn   Breast cancer (HCC)    right - local excision, chemotheraphy   Cataract    right eye   Chronic back pain    Depression    has appt to discuss depression 07/26/2011   Diverticulosis    Dizziness    occasionally   Gastric ulcer    GERD (gastroesophageal reflux disease)    takes Nexium daily   Hemorrhoids    History of nuclear stress test 11/2010   lexiscan ; no evidence of inducible ischemia; normal pattern of perfusion    History of shingles 2011   Hx of colonic polyps    Hyperlipidemia    takes Zocor daily   Hypertension    takes Bystolic  and Losartan daily   Hypothyroidism    takes Synthroid  daily   Nausea alone    onset last Wednesday  08/04/11   Nocturia    Osteoporosis    PONV (postoperative nausea and vomiting)    PVC's (premature ventricular contractions)    Shortness of breath    with exertion   Skin rash    Urinary frequency    Urticaria of unknown  origin     Past Surgical History:  Procedure Laterality Date   ABDOMINAL HYSTERECTOMY  2001   BREAST LUMPECTOMY Right 07/02/2011   right breast and SNL   BREAST SURGERY Left 1972   left lumpectomy   BREAST SURGERY Right 08/11/2011   margins on rt breast   CATARACT EXTRACTION W/PHACO Right 08/31/2016   Procedure: CATARACT EXTRACTION PHACO AND INTRAOCULAR LENS PLACEMENT (IOC);  Surgeon: Oneil Platts, MD;  Location: [facility];  Service: Ophthalmology;  Laterality: Right;  CDE: 8.46   CATARACT EXTRACTION W/PHACO Left 09/14/2016   Procedure: CATARACT EXTRACTION PHACO AND INTRAOCULAR LENS PLACEMENT (IOC);  Surgeon: Oneil Platts, MD;  Location: [facility];  Service: Ophthalmology;  Laterality: Left;  CDE: 9.32   COLONOSCOPY     HEMORRHOID SURGERY     LEFT HEART CATH AND CORONARY ANGIOGRAPHY N/A 12/17/2021   Procedure: LEFT HEART CATH AND CORONARY ANGIOGRAPHY;  Surgeon: Burnard Debby LABOR, MD;  Location: [facility];  Service: Cardiovascular;  Laterality: N/A;   TRANSTHORACIC ECHOCARDIOGRAM  2009   normal LV & RV systolic function; mild mitral annular calcif & mild MR; trace TR    Current Outpatient Medications  Medication Sig Dispense Refill   amLODipine  (NORVASC ) 5 MG tablet TAKE 1 TABLET(5 MG) BY MOUTH DAILY 90 tablet 0   carvedilol  (  COREG ) 12.5 MG tablet TAKE 1 TABLET(12.5 MG) BY MOUTH TWICE DAILY 60 tablet 0   citalopram  (CELEXA ) 20 MG tablet Take 20 mg by mouth daily.     famotidine (PEPCID) 20 MG tablet Take 40 mg by mouth daily. (Patient not taking: Reported on 03/09/2024)     gabapentin  (NEURONTIN ) 100 MG capsule Take 1 capsule (100 mg total) by mouth 3 (three) times daily. 90 capsule 3   hydrochlorothiazide (HYDRODIURIL) 12.5 MG tablet Take 12.5 mg by mouth daily.     levothyroxine  (SYNTHROID , LEVOTHROID) 50 MCG tablet Take 50 mcg by mouth daily.     LORazepam  (ATIVAN ) 1 MG tablet Take 1 mg by mouth at bedtime. For anxiety may take extra tablet if needed     nitroGLYCERIN  (NITROSTAT )  0.4 MG SL tablet Place 1 tablet (0.4 mg total) under the tongue every 5 (five) minutes as needed for chest pain. 100 tablet 0   omeprazole  (PRILOSEC) 40 MG capsule TAKE 1 CAPSULE BY MOUTH SHORTLY BEFORE BREAKFAST DAILY. 90 capsule 0   psyllium (METAMUCIL) 58.6 % powder Take 1 packet by mouth daily as needed (constipation).     rosuvastatin  (CRESTOR ) 40 MG tablet TAKE 1 TABLET(40 MG) BY MOUTH DAILY 90 tablet 0   Spacer/Aero-Holding Chambers (AEROCHAMBER PLUS) inhaler Use with inhaler 1 each 2   Current Facility-Administered Medications  Medication Dose Route Frequency Provider Last Rate Last Admin   0.9 %  sodium chloride  infusion  500 mL Intravenous Once Teressa Toribio SQUIBB, MD       Allergies:  Bisoprolol-hydrochlorothiazide, Hydrocodone, Lipitor  [atorvastatin ], Lisinopril, Oxycodone , Oxycodone  hcl, Ziac [bisoprolol-hydrochlorothiazide], and Penicillins   Social History: The patient  reports that she has never smoked. She has been exposed to tobacco smoke. She has never used smokeless tobacco. She reports that she does not drink alcohol and does not use drugs.   Family History: The patient's family history includes Anxiety disorder in her daughter; Cirrhosis in her sister; Colon cancer in her mother; Deafness in her son; Diabetes in her daughter and mother; Gallbladder disease in her daughter; Heart attack in her father and son; Heart disease in her father; Hyperlipidemia in her daughter; Hypertension in her father and son; Kidney disease in her sister; Lung cancer in her sister; Other in her brother; Pancreatic cancer in her father; Stroke in her mother; Thyroid  disease in her daughter.   ROS:  Please see the history of present illness. Otherwise, complete review of systems is positive for none.  All other systems are reviewed and negative.   Physical Exam: VS:  There were no vitals taken for this visit., BMI There is no height or weight on file to calculate BMI.  Wt Readings from Last 3  Encounters:  03/09/24 159 lb (72.1 kg)  02/28/24 159 lb 6 oz (72.3 kg)  12/19/23 159 lb 13.3 oz (72.5 kg)    General: Patient appears comfortable at rest. HEENT: Conjunctiva and lids normal, oropharynx clear with moist mucosa. Neck: Supple, no elevated JVP or carotid bruits, no thyromegaly. Lungs: Clear to auscultation, nonlabored breathing at rest. Cardiac: Regular rate and rhythm, no S3 or significant systolic murmur, no pericardial rub. Abdomen: Soft, nontender, no hepatomegaly, bowel sounds present, no guarding or rebound. Extremities: No pitting edema, distal pulses 2+. Skin: Warm and dry. Musculoskeletal: No kyphosis. Neuropsychiatric: Alert and oriented x3, affect grossly appropriate.  ECG: NSR  Recent Labwork: 06/29/2023: ALT 14; AST 20 07/01/2023: BUN 14; Creatinine, Ser 1.20; Hemoglobin 10.5; Magnesium 2.2; Platelets 227; Potassium 3.8; Sodium 136  Component Value Date/Time   CHOL 130 02/15/2022 1025   CHOL 169 05/11/2013 1021   TRIG 167 (H) 02/15/2022 1025   TRIG 231 (H) 05/11/2013 1021   HDL 61 02/15/2022 1025   HDL 47 05/11/2013 1021   CHOLHDL 2.1 02/15/2022 1025   VLDL 33 02/15/2022 1025   LDLCALC 36 02/15/2022 1025   LDLCALC 76 05/11/2013 1021    Other Studies Reviewed Today: LHC in 12/2021   Ost LAD to Prox LAD lesion is 20% stenosed.   Non-stenotic Mid LAD to Dist LAD lesion. No significant coronary obstructive disease with smooth 20% ostial narrowing of the LAD and evidence for a mid LAD intramyocardial segment without muscle bridging.  Echo in 12/2021 Normal LVEF No valve abnormalities  Assessment and Plan:  # Nonobstructive CAD, 20% LAD stenosis: No angina or DOE. No further work-up.  # HLD: Continue rosuvastatin  40 mg nightly, Goal LDL<100.  # History of SVT in 2023: No recurrences since then. No interval palpitations on carvedilol  12.5 mg twice daily. Continue carvedilol  12.5 mg BID.  # Severe CPAP: Continue CPAP  I have spent a total of  20 minutes with patient reviewing chart, EKGs, labs and examining patient as well as establishing an assessment and plan that was discussed with the patient.  > 50% of time was spent in direct patient care.     Medication Adjustments/Labs and Tests Ordered: Current medicines are reviewed at length with the patient today.  Concerns regarding medicines are outlined above.   Tests Ordered: No orders of the defined types were placed in this encounter.   Medication Changes: No orders of the defined types were placed in this encounter.   Disposition:  Follow up PRN  Signed, Joram Venson Arleta Maywood, MD, 04/09/2024 11:31 AM    [facility] at [facility] [address], [place], [place] [address]

## 2024-04-09 NOTE — Patient Instructions (Addendum)
 Medication Instructions:  Your physician recommends that you continue on your current medications as directed. Please refer to the Current Medication list given to you today.   Labwork: None today  Testing/Procedures: None today  Follow-Up: As needed  Any Other Special Instructions Will Be Listed Below (If Applicable).  If you need a refill on your cardiac medications before your next appointment, please call your pharmacy.

## 2024-05-03 ENCOUNTER — Other Ambulatory Visit: Payer: Self-pay | Admitting: Internal Medicine

## 2024-05-28 ENCOUNTER — Observation Stay (HOSPITAL_COMMUNITY)
Admission: EM | Admit: 2024-05-28 | Discharge: 2024-05-30 | Disposition: A | Discharge status: Home / Self Care | Attending: Hospitalist | Admitting: Hospitalist

## 2024-05-28 ENCOUNTER — Other Ambulatory Visit: Payer: Self-pay

## 2024-05-28 ENCOUNTER — Encounter (HOSPITAL_COMMUNITY): Payer: Self-pay | Admitting: Emergency Medicine

## 2024-05-28 ENCOUNTER — Emergency Department (HOSPITAL_COMMUNITY)

## 2024-05-28 DIAGNOSIS — Z7901 Long term (current) use of anticoagulants: Secondary | ICD-10-CM | POA: Diagnosis not present

## 2024-05-28 DIAGNOSIS — I10 Essential (primary) hypertension: Secondary | ICD-10-CM | POA: Diagnosis not present

## 2024-05-28 DIAGNOSIS — R6510 Systemic inflammatory response syndrome (SIRS) of non-infectious origin without acute organ dysfunction: Secondary | ICD-10-CM | POA: Diagnosis not present

## 2024-05-28 DIAGNOSIS — B348 Other viral infections of unspecified site: Secondary | ICD-10-CM | POA: Insufficient documentation

## 2024-05-28 DIAGNOSIS — K219 Gastro-esophageal reflux disease without esophagitis: Secondary | ICD-10-CM | POA: Diagnosis not present

## 2024-05-28 DIAGNOSIS — J9601 Acute respiratory failure with hypoxia: Principal | ICD-10-CM | POA: Insufficient documentation

## 2024-05-28 DIAGNOSIS — R509 Fever, unspecified: Secondary | ICD-10-CM

## 2024-05-28 DIAGNOSIS — E876 Hypokalemia: Secondary | ICD-10-CM | POA: Diagnosis not present

## 2024-05-28 DIAGNOSIS — E039 Hypothyroidism, unspecified: Secondary | ICD-10-CM | POA: Diagnosis present

## 2024-05-28 DIAGNOSIS — I129 Hypertensive chronic kidney disease with stage 1 through stage 4 chronic kidney disease, or unspecified chronic kidney disease: Secondary | ICD-10-CM | POA: Diagnosis not present

## 2024-05-28 DIAGNOSIS — E785 Hyperlipidemia, unspecified: Secondary | ICD-10-CM | POA: Diagnosis not present

## 2024-05-28 DIAGNOSIS — N189 Chronic kidney disease, unspecified: Secondary | ICD-10-CM | POA: Diagnosis present

## 2024-05-28 DIAGNOSIS — Z79899 Other long term (current) drug therapy: Secondary | ICD-10-CM | POA: Diagnosis not present

## 2024-05-28 DIAGNOSIS — J029 Acute pharyngitis, unspecified: Secondary | ICD-10-CM

## 2024-05-28 DIAGNOSIS — J45909 Unspecified asthma, uncomplicated: Secondary | ICD-10-CM | POA: Diagnosis not present

## 2024-05-28 DIAGNOSIS — G4733 Obstructive sleep apnea (adult) (pediatric): Secondary | ICD-10-CM | POA: Insufficient documentation

## 2024-05-28 DIAGNOSIS — F419 Anxiety disorder, unspecified: Secondary | ICD-10-CM | POA: Insufficient documentation

## 2024-05-28 DIAGNOSIS — R1310 Dysphagia, unspecified: Secondary | ICD-10-CM | POA: Diagnosis present

## 2024-05-28 DIAGNOSIS — Z7722 Contact with and (suspected) exposure to environmental tobacco smoke (acute) (chronic): Secondary | ICD-10-CM | POA: Insufficient documentation

## 2024-05-28 DIAGNOSIS — Z853 Personal history of malignant neoplasm of breast: Secondary | ICD-10-CM | POA: Insufficient documentation

## 2024-05-28 DIAGNOSIS — N1831 Chronic kidney disease, stage 3a: Secondary | ICD-10-CM | POA: Insufficient documentation

## 2024-05-28 DIAGNOSIS — Z1152 Encounter for screening for COVID-19: Secondary | ICD-10-CM | POA: Insufficient documentation

## 2024-05-28 LAB — URINALYSIS, ROUTINE W REFLEX MICROSCOPIC
Bacteria, UA: NONE SEEN
Bilirubin Urine: NEGATIVE
Glucose, UA: 50 mg/dL — AB
Hgb urine dipstick: NEGATIVE
Ketones, ur: NEGATIVE mg/dL
Leukocytes,Ua: NEGATIVE
Nitrite: NEGATIVE
Protein, ur: 30 mg/dL — AB
Specific Gravity, Urine: >1.046 — ABNORMAL HIGH (ref 1.005–1.030)
pH: 6 (ref 5.0–8.0)

## 2024-05-28 LAB — CBC WITH DIFFERENTIAL/PLATELET
Abs Immature Granulocytes: 0.04 K/uL (ref 0.00–0.07)
Basophils Absolute: 0 K/uL (ref 0.0–0.1)
Basophils Relative: 0 %
Eosinophils Absolute: 0 K/uL (ref 0.0–0.5)
Eosinophils Relative: 0 %
HCT: 34.7 % — ABNORMAL LOW (ref 36.0–46.0)
Hemoglobin: 11.1 g/dL — ABNORMAL LOW (ref 12.0–15.0)
Immature Granulocytes: 1 %
Lymphocytes Relative: 8 %
Lymphs Abs: 0.6 K/uL — ABNORMAL LOW (ref 0.7–4.0)
MCH: 27.3 pg (ref 26.0–34.0)
MCHC: 32 g/dL (ref 30.0–36.0)
MCV: 85.3 fL (ref 80.0–100.0)
Monocytes Absolute: 1.2 K/uL — ABNORMAL HIGH (ref 0.1–1.0)
Monocytes Relative: 16 %
Neutro Abs: 5.8 K/uL (ref 1.7–7.7)
Neutrophils Relative %: 75 %
Platelets: 217 K/uL (ref 150–400)
RBC: 4.07 MIL/uL (ref 3.87–5.11)
RDW: 15.2 % (ref 11.5–15.5)
WBC: 7.7 K/uL (ref 4.0–10.5)
nRBC: 0 % (ref 0.0–0.2)

## 2024-05-28 LAB — RESP PANEL BY RT-PCR (RSV, FLU A&B, COVID)  RVPGX2
Influenza A by PCR: NEGATIVE
Influenza B by PCR: NEGATIVE
Resp Syncytial Virus by PCR: NEGATIVE
SARS Coronavirus 2 by RT PCR: NEGATIVE

## 2024-05-28 LAB — BASIC METABOLIC PANEL WITH GFR
Anion gap: 10 (ref 5–15)
BUN: 12 mg/dL (ref 8–23)
CO2: 30 mmol/L (ref 22–32)
Calcium: 8.5 mg/dL — ABNORMAL LOW (ref 8.9–10.3)
Chloride: 96 mmol/L — ABNORMAL LOW (ref 98–111)
Creatinine, Ser: 1.16 mg/dL — ABNORMAL HIGH (ref 0.44–1.00)
GFR, Estimated: 47 mL/min — ABNORMAL LOW (ref >60)
Glucose, Bld: 118 mg/dL — ABNORMAL HIGH (ref 70–99)
Potassium: 3.3 mmol/L — ABNORMAL LOW (ref 3.5–5.1)
Sodium: 135 mmol/L (ref 135–145)

## 2024-05-28 LAB — MAGNESIUM: Magnesium: 2.1 mg/dL (ref 1.7–2.4)

## 2024-05-28 LAB — GROUP A STREP BY PCR: Group A Strep by PCR: NOT DETECTED

## 2024-05-28 LAB — GLUCOSE, CAPILLARY: Glucose-Capillary: 258 mg/dL — ABNORMAL HIGH (ref 70–99)

## 2024-05-28 LAB — MONONUCLEOSIS SCREEN: Mono Screen: NEGATIVE

## 2024-05-28 LAB — LACTIC ACID, PLASMA: Lactic Acid, Venous: 1.2 mmol/L (ref 0.5–1.9)

## 2024-05-28 MED ORDER — PREDNISONE 20 MG PO TABS
40.0000 mg | ORAL_TABLET | Freq: Every day | ORAL | Status: DC
  Administered 2024-05-29 – 2024-05-30 (×2): 40 mg via ORAL
  Filled 2024-05-28 (×2): qty 2

## 2024-05-28 MED ORDER — DEXAMETHASONE SOD PHOSPHATE PF 10 MG/ML IJ SOLN
10.0000 mg | Freq: Once | INTRAMUSCULAR | Status: AC
  Administered 2024-05-28: 10 mg via INTRAVENOUS

## 2024-05-28 MED ORDER — ALBUTEROL SULFATE (2.5 MG/3ML) 0.083% IN NEBU
5.0000 mg | INHALATION_SOLUTION | RESPIRATORY_TRACT | Status: AC
Start: 2024-05-28 — End: 2024-05-28
  Administered 2024-05-28 (×3): 5 mg via RESPIRATORY_TRACT
  Filled 2024-05-28: qty 6

## 2024-05-28 MED ORDER — POLYETHYLENE GLYCOL 3350 17 G PO PACK: 17.0000 g | PACK | Freq: Every day | ORAL | Status: DC | PRN

## 2024-05-28 MED ORDER — HYDROCHLOROTHIAZIDE 12.5 MG PO TABS
12.5000 mg | ORAL_TABLET | Freq: Every day | ORAL | Status: DC
Start: 2024-05-29 — End: 2024-05-28

## 2024-05-28 MED ORDER — ONDANSETRON HCL 4 MG PO TABS: 4.0000 mg | ORAL_TABLET | Freq: Four times a day (QID) | ORAL | Status: DC | PRN

## 2024-05-28 MED ORDER — ENOXAPARIN SODIUM 40 MG/0.4ML IJ SOSY
40.0000 mg | PREFILLED_SYRINGE | Freq: Every day | INTRAMUSCULAR | Status: DC
  Administered 2024-05-28: 40 mg via SUBCUTANEOUS
  Filled 2024-05-28 (×2): qty 0.4

## 2024-05-28 MED ORDER — ONDANSETRON HCL 4 MG/2ML IJ SOLN: 4.0000 mg | Freq: Four times a day (QID) | INTRAMUSCULAR | Status: DC | PRN

## 2024-05-28 MED ORDER — PANTOPRAZOLE SODIUM 40 MG PO TBEC
40.0000 mg | DELAYED_RELEASE_TABLET | Freq: Every day | ORAL | Status: DC
  Administered 2024-05-29 – 2024-05-30 (×2): 40 mg via ORAL
  Filled 2024-05-28 (×2): qty 1

## 2024-05-28 MED ORDER — PHENOL 1.4 % MT LIQD
1.0000 | OROMUCOSAL | Status: DC | PRN
  Administered 2024-05-29: 1 via OROMUCOSAL
  Filled 2024-05-28: qty 177

## 2024-05-28 MED ORDER — SODIUM CHLORIDE 0.9 % IV SOLN: INTRAVENOUS | Status: DC

## 2024-05-28 MED ORDER — ROSUVASTATIN CALCIUM 20 MG PO TABS
40.0000 mg | ORAL_TABLET | Freq: Every day | ORAL | Status: DC
  Administered 2024-05-29 – 2024-05-30 (×2): 40 mg via ORAL
  Filled 2024-05-28 (×2): qty 2

## 2024-05-28 MED ORDER — IOHEXOL 350 MG/ML SOLN
100.0000 mL | Freq: Once | INTRAVENOUS | Status: AC | PRN
  Administered 2024-05-28: 100 mL via INTRAVENOUS

## 2024-05-28 MED ORDER — CITALOPRAM HYDROBROMIDE 20 MG PO TABS
20.0000 mg | ORAL_TABLET | Freq: Every day | ORAL | Status: DC
  Administered 2024-05-29 – 2024-05-30 (×2): 20 mg via ORAL
  Filled 2024-05-28 (×2): qty 1

## 2024-05-28 MED ORDER — POTASSIUM CHLORIDE 20 MEQ PO PACK
40.0000 meq | PACK | ORAL | Status: AC
Start: 2024-05-28 — End: 2024-05-28
  Administered 2024-05-28 (×2): 40 meq via ORAL
  Filled 2024-05-28 (×2): qty 2

## 2024-05-28 MED ORDER — GUAIFENESIN-DM 100-10 MG/5ML PO SYRP
15.0000 mL | ORAL_SOLUTION | Freq: Three times a day (TID) | ORAL | Status: AC
  Administered 2024-05-28 – 2024-05-29 (×3): 15 mL via ORAL
  Filled 2024-05-28 (×2): qty 15

## 2024-05-28 MED ORDER — ALBUTEROL SULFATE (2.5 MG/3ML) 0.083% IN NEBU: 2.5000 mg | INHALATION_SOLUTION | RESPIRATORY_TRACT | Status: DC | PRN

## 2024-05-28 MED ORDER — CARVEDILOL 3.125 MG PO TABS: 6.2500 mg | ORAL_TABLET | Freq: Two times a day (BID) | ORAL | Status: DC

## 2024-05-28 MED ORDER — ACETAMINOPHEN 325 MG PO TABS
650.0000 mg | ORAL_TABLET | Freq: Once | ORAL | Status: AC
  Administered 2024-05-28: 650 mg via ORAL
  Filled 2024-05-28: qty 2

## 2024-05-28 MED ORDER — LORAZEPAM 1 MG PO TABS
1.0000 mg | ORAL_TABLET | Freq: Every day | ORAL | Status: DC
  Administered 2024-05-28 – 2024-05-29 (×2): 1 mg via ORAL
  Filled 2024-05-28 (×2): qty 1

## 2024-05-28 MED ORDER — LEVOTHYROXINE SODIUM 50 MCG PO TABS
50.0000 ug | ORAL_TABLET | Freq: Every day | ORAL | Status: DC
  Administered 2024-05-29 – 2024-05-30 (×2): 50 ug via ORAL
  Filled 2024-05-28 (×2): qty 1

## 2024-05-28 MED ORDER — AMLODIPINE BESYLATE 5 MG PO TABS
5.0000 mg | ORAL_TABLET | Freq: Every day | ORAL | Status: DC
Start: 2024-05-29 — End: 2024-05-28

## 2024-05-28 NOTE — ED Notes (Signed)
 Report given to receiving nurse no further questions at this time

## 2024-05-28 NOTE — Progress Notes (Signed)
 Spoke with patient about using CPAP for tonight.  Patient stated that she would rather use the [place] for tonight.  I made patient aware that if she wanted to change her mind, we have machines here for her to use.

## 2024-05-28 NOTE — ED Notes (Signed)
 Attempted to ween patient off of oxygen, 02 ranging 86-88%, 2L placed.

## 2024-05-28 NOTE — ED Triage Notes (Signed)
 Pt c/o sore throat, states that it hurts to swallow and believes she has strep throat. Also endorses bilateral ear pain/burning, shob, and fever, chills. Rates pain 8/10

## 2024-05-28 NOTE — ED Provider Notes (Signed)
 [place] EMERGENCY DEPARTMENT AT [facility] Provider Note   CSN: [id number] Arrival date & time: 05/28/24  9193     Patient presents with: Sore Throat   Jane Wilkerson is a 83 y.o. female.   HPI     83 year old female comes in with chief complaint of sore throat and fever. Patient has history of hyper lipidemia, CAD, SVT, hypertension.  She reports that over the weekend she developed a sore throat.  The sore throat is described as similar to her previous strep throat.  She has pain with swallowing.  She also is having fevers.  Patient has postnasal drip and has bilateral ear pain.  Review of system is also positive for fevers, chills feeling weak and having some shortness of breath.  But no cough.  Patient noted to have mild hypoxia.  She states that she normally uses CPAP at night because of sleep apnea.  She does not have any underlying lung disease like COPD.  Husband is at the bedside, states that  patient's daughter had tickborne illness at some point and developed alpha gal syndrome.  Patient however denies any tick bites.  She has no rash.  Patient has no chest pain.  Patient was around her grandkids last week and they had some cold like symptoms.  Prior to Admission medications   Medication Sig Start Date End Date Taking? Authorizing Provider  amLODipine  (NORVASC ) 5 MG tablet TAKE 1 TABLET(5 MG) BY MOUTH DAILY 05/04/24   Mallipeddi, Vishnu P, MD  carvedilol  (COREG ) 12.5 MG tablet TAKE 1 TABLET(12.5 MG) BY MOUTH TWICE DAILY 07/13/23   Hilty, Vinie BROCKS, MD  citalopram  (CELEXA ) 20 MG tablet Take 20 mg by mouth daily. 11/19/23   [provider]  famotidine (PEPCID) 20 MG tablet Take 40 mg by mouth daily. Patient not taking: Reported on 04/09/2024 11/19/23   [provider]  gabapentin  (NEURONTIN ) 100 MG capsule Take 1 capsule (100 mg total) by mouth 3 (three) times daily. 03/09/24   Boswell, Chelsa, NP  hydrochlorothiazide  (HYDRODIURIL ) 12.5 MG tablet  Take 12.5 mg by mouth daily. 01/09/24   [provider]  levothyroxine  (SYNTHROID , LEVOTHROID) 50 MCG tablet Take 50 mcg by mouth daily.    [provider]  LORazepam  (ATIVAN ) 1 MG tablet Take 1 mg by mouth at bedtime. For anxiety may take extra tablet if needed    [provider]  nitroGLYCERIN  (NITROSTAT ) 0.4 MG SL tablet Place 1 tablet (0.4 mg total) under the tongue every 5 (five) minutes as needed for chest pain. 12/17/21   Lue Elsie BROCKS, MD  omeprazole  (PRILOSEC) 40 MG capsule TAKE 1 CAPSULE BY MOUTH SHORTLY BEFORE BREAKFAST DAILY. 11/16/21   Teressa Toribio SQUIBB, MD  psyllium (METAMUCIL) 58.6 % powder Take 1 packet by mouth daily as needed (constipation).    [provider]  rosuvastatin  (CRESTOR ) 40 MG tablet TAKE 1 TABLET(40 MG) BY MOUTH DAILY 10/12/23   Parthenia Olivia HERO, PA-C  Spacer/Aero-Holding Chambers (AEROCHAMBER PLUS) inhaler Use with inhaler 04/17/21   Van Knee, MD    Allergies: Bisoprolol-hydrochlorothiazide , Hydrocodone, Lipitor  [atorvastatin ], Lisinopril, Oxycodone , Oxycodone  hcl, Ziac [bisoprolol-hydrochlorothiazide ], and Penicillins    Review of Systems  All other systems reviewed and are negative.   Updated Vital Signs BP 115/62 (BP Location: Left Arm)   Pulse 93   Temp 98.3 F (36.8 C) (Oral)   Resp 18   SpO2 96%   Physical Exam Vitals and nursing note reviewed.  Constitutional:      Appearance:  She is well-developed.  HENT:     Head: Atraumatic.     Mouth/Throat:     Mouth: No oral lesions.     Pharynx: Posterior oropharyngeal erythema present. No oropharyngeal exudate.     Tonsils: No tonsillar exudate or tonsillar abscesses.  Cardiovascular:     Rate and Rhythm: Normal rate.  Pulmonary:     Effort: Pulmonary effort is normal.     Breath sounds: No stridor. No wheezing or rhonchi.  Musculoskeletal:     Cervical back: Normal range of motion and neck supple.  Lymphadenopathy:     Cervical: Cervical adenopathy  present.  Skin:    General: Skin is warm and dry.  Neurological:     Mental Status: She is alert and oriented to person, place, and time.     (all labs ordered are listed, but only abnormal results are displayed) Labs Reviewed  CBC WITH DIFFERENTIAL/PLATELET - Abnormal; Notable for the following components:      Result Value   Hemoglobin 11.1 (*)    HCT 34.7 (*)    Lymphs Abs 0.6 (*)    Monocytes Absolute 1.2 (*)    All other components within normal limits  BASIC METABOLIC PANEL WITH GFR - Abnormal; Notable for the following components:   Potassium 3.3 (*)    Chloride 96 (*)    Glucose, Bld 118 (*)    Creatinine, Ser 1.16 (*)    Calcium  8.5 (*)    GFR, Estimated 47 (*)    All other components within normal limits  RESP PANEL BY RT-PCR (RSV, FLU A&B, COVID)  [id number]  GROUP A STREP BY PCR  CULTURE, GROUP A STREP [facility])  MONONUCLEOSIS SCREEN    EKG: None  Radiology: CT Angio Chest PE W and/or Wo Contrast Result Date: 05/28/2024 CLINICAL DATA:  High probability for PE. Shortness of breath and fever. EXAM: CT ANGIOGRAPHY CHEST WITH CONTRAST TECHNIQUE: Multidetector CT imaging of the chest was performed using the standard protocol during bolus administration of intravenous contrast. Multiplanar CT image reconstructions and MIPs were obtained to evaluate the vascular anatomy. RADIATION DOSE REDUCTION: This exam was performed according to the departmental dose-optimization program which includes automated exposure control, adjustment of the mA and/or kV according to patient size and/or use of iterative reconstruction technique. CONTRAST:  OMNIPAQUE  IOHEXOL  350 MG/ML SOLN COMPARISON:  CT chest 04/29/2023 FINDINGS: Cardiovascular: Satisfactory opacification of the pulmonary arteries to the segmental level. No evidence of pulmonary embolism. Normal heart size. No pericardial effusion. There are atherosclerotic calcifications of the aorta and coronary arteries. Mediastinum/Nodes: No  enlarged mediastinal, hilar, or axillary lymph nodes. Thyroid  gland, trachea, and esophagus demonstrate no significant findings. There is a small hiatal hernia. Lungs/Pleura: Lungs are clear. No pleural effusion or pneumothorax. Upper Abdomen: No acute abnormality. Musculoskeletal: No chest wall abnormality. No acute or significant osseous findings. Review of the MIP images confirms the above findings. IMPRESSION: 1. No evidence for pulmonary embolism. 2. No acute cardiopulmonary process. 3. Small hiatal hernia. 4. Aortic atherosclerosis. Aortic Atherosclerosis (ICD10-I70.0). Electronically Signed   By: Greig Pique M.D.   On: 05/28/2024 15:11   CT Soft Tissue Neck W Contrast Result Date: 05/28/2024 CLINICAL DATA:  Sore throat, fever and chills EXAM: CT NECK WITH CONTRAST TECHNIQUE: Multidetector CT imaging of the neck was performed using the standard protocol following the bolus administration of intravenous contrast. RADIATION DOSE REDUCTION: This exam was performed according to the departmental dose-optimization program which includes automated exposure control, adjustment of the mA  and/or kV according to patient size and/or use of iterative reconstruction technique. CONTRAST:  OMNIPAQUE  IOHEXOL  350 MG/ML SOLN COMPARISON:  None Available. FINDINGS: Pharynx: The nasopharynx, oropharynx and hypopharynx are normal Oral cavity/floor of mouth: Normal Larynx: Normal Salivary glands: The parotid and submandibular glands are normal Thyroid : Normal Lymph nodes: No adenopathy Vascular: No significant abnormality Limited intracranial: No significant abnormality Visualized orbits: No significant abnormality Mastoids and visualized paranasal sinuses: No significant abnormality Skeleton: Severe degenerative disc disease at C5-6 and C6-7 Upper chest: No significant abnormality Other: None IMPRESSION: No peritonsillar abscess or other significant abnormality Electronically Signed   By: Nancyann Burns M.D.   On:  05/28/2024 14:50   DG Neck Soft Tissue Result Date: 05/28/2024 CLINICAL DATA:  Sore throat with pain on swallowing. Evaluate for deep space infection. EXAM: NECK SOFT TISSUES - 1+ VIEW COMPARISON:  None Available. FINDINGS: There is no evidence of retropharyngeal soft tissue swelling or epiglottic enlargement. The cervical airway is unremarkable and no radio-opaque foreign body identified. Mild multilevel cervical spondylosis without evidence of acute osseous abnormality. IMPRESSION: No plain film evidence of deep space infection or airway compromise. CT should be considered for more definitive evaluation, as clinically warranted. Electronically Signed   By: Elsie Perone M.D.   On: 05/28/2024 11:36   DG Chest 2 View Result Date: 05/28/2024 CLINICAL DATA:  Shortness of breath and fever. EXAM: CHEST - 2 VIEW COMPARISON:  Chest radiograph dated 06/29/2023. FINDINGS: Shallow inspiration. No focal consolidation, pleural effusion or pneumothorax. Stable cardiac silhouette. No acute osseous pathology. IMPRESSION: No active cardiopulmonary disease. Electronically Signed   By: Vanetta Chou M.D.   On: 05/28/2024 11:35     .Critical Care  Performed by: Charlyn Sora, MD Authorized by: Charlyn Sora, MD   Critical care provider statement:    Critical care time (minutes):  37   Critical care was necessary to treat or prevent imminent or life-threatening deterioration of the following conditions:  Respiratory failure   Critical care was time spent personally by me on the following activities:  Development of treatment plan with patient or surrogate, discussions with consultants, evaluation of patient's response to treatment, examination of patient, ordering and review of laboratory studies, ordering and review of radiographic studies, ordering and performing treatments and interventions, pulse oximetry, re-evaluation of patient's condition, review of old charts and obtaining history from patient or  surrogate    Medications Ordered in the ED  albuterol  (PROVENTIL ) (2.5 MG/3ML) 0.083% nebulizer solution 5 mg (5 mg Nebulization Given 05/28/24 0850)  acetaminophen  (TYLENOL ) tablet 650 mg (650 mg Oral Given 05/28/24 0947)  dexamethasone  (DECADRON ) injection 10 mg (10 mg Intravenous Given 05/28/24 1043)  iohexol  (OMNIPAQUE ) 350 MG/ML injection 100 mL (100 mLs Intravenous Contrast Given 05/28/24 1407)    Clinical Course as of 05/28/24 1544  Mon May 28, 2024  1146 X-ray of the chest independently interpreted.  There is no evidence of pneumonia.  Soft tissue neck per radiologist does not show any significant swelling.  Patient's fever has come down. [AN]  1541 CT scans are negative for any acute findings.  We attempted to wean patient off of oxygen, but O2 sats dropped to 88%.  She has been put on oxygen.  We will admit her. [AN]    Clinical Course User Index [AN] Charlyn Sora, MD  Medical Decision Making Amount and/or Complexity of Data Reviewed Labs: ordered. Radiology: ordered.  Risk OTC drugs. Prescription drug management. Decision regarding hospitalization.   This patient presents to the ED with chief complaint(s) of sore throat, fevers, ear pain/congestion and some shortness of breath with pertinent past medical history of CAD, hyperlipidemia, sleep apnea and no primary lung disease history.The complaint involves an extensive differential diagnosis and also carries with it a high risk of complications and morbidity.    The differential diagnosis includes : Strep pharyngitis, viral URI including COVID and flu, mononucleosis, pneumonia. Given that patient has no rash, and denies any specific exposure to tick, my suspicion for acute tickborne illness is lower.  We will pursue that workup only if everything in the ER is reassuring.  Patient has no trismus, stridor.  However her O2 sats does drop into the upper 80s, and we have placed her on  oxygen.  I will get x-ray of the chest.   I reviewed patient's records.  In December 2024, she was admitted for SIRS and hypoxia and found to have rhinovirus.  This could be similar process, if the workup is negative, we will consider admission.  We will decide on additional workup at that time if needed.  The initial plan is to get basic labs, chest x-ray, soft tissue neck x-ray.   Additional history obtained: Additional history obtained from spouse Records reviewed previous admission documents  Independent labs interpretation:  The following labs were independently interpreted: all reassuring.  Independent visualization and interpretation of imaging: - I independently visualized the following imaging with scope of interpretation limited to determining acute life threatening conditions related to emergency care: cxr, which revealed no pneumonia.   Treatment and Reassessment: Family informed of the plan to admit the patient for hypoxia.  CT scans pending.  Reassessment: CT is negative.  Will proceed with admission. Final diagnoses:  Acute hypoxic respiratory failure [facility])    ED Discharge Orders     None          Charlyn Sora, MD 05/28/24 1544

## 2024-05-28 NOTE — H&P (Signed)
 History and Physical    Jane Wilkerson FMW:[id number] DOB: Feb 23, 1941 DOA: 05/28/2024  PCP: Jane Sand, NP (Inactive)   Patient coming from: Home  I have personally briefly reviewed patient's old medical records in [facility]  Chief Complaint: Sore throat  HPI: Jane Wilkerson is a 83 y.o. female with medical history significant for CKD 3a, hypertension, OSA, breast cancer. Patient presented to the ED with complaints of sore throat, with pain when swallowing, and pain to her bilateral ears that started Friday night.  Reports difficulty breathing today.  No chest pain.  No lower extremity swelling.  No abdominal bloating.  She also reports subjective fevers and chills.  She reports her grandkids had similar illness.  She is not on home O2.  Never smoked cigarettes. She received COVID influenza and RSV vaccination about 3 weeks ago.  Similar hospitalization a year ago 06/2023 with SIRS, patient was later found to have rhinovirus.  ED Course: Tmax 102.9.  Heart rates 88-108, respiratory rate 16-27.  Blood pressure systolic 94-147.  O2 sats down  86 to 88% on room air, placed on 2 L. Group A strep mononucleosis screen negative. CTA chest negative for acute abnormality.  CT soft tissue neck with contrast-also unremarkable. WBC 7.7. Albuterol  nebs given, dexamethasone  10 mg x 1 given.  Hospitalist to admit for acute hypoxic respiratory failure.  Review of Systems: As per HPI all other systems reviewed and negative.  Past Medical History:  Diagnosis Date   Allergy    Anxiety 07/07/2011   takes Ativan  every 4hrs   Arthritis    back    Asthma    uses inhaler prn   Breast cancer (HCC)    right - local excision, chemotheraphy   Cataract    right eye   Chronic back pain    Depression    has appt to discuss depression 07/26/2011   Diverticulosis    Dizziness    occasionally   Gastric ulcer    GERD (gastroesophageal reflux disease)    takes Nexium daily   Hemorrhoids     History of nuclear stress test 11/2010   lexiscan ; no evidence of inducible ischemia; normal pattern of perfusion    History of shingles 2011   Hx of colonic polyps    Hyperlipidemia    takes Zocor daily   Hypertension    takes Bystolic  and Losartan daily   Hypothyroidism    takes Synthroid  daily   Nausea alone    onset last Wednesday  08/04/11   Nocturia    Osteoporosis    PONV (postoperative nausea and vomiting)    PVC's (premature ventricular contractions)    Shortness of breath    with exertion   Skin rash    Urinary frequency    Urticaria of unknown origin     Past Surgical History:  Procedure Laterality Date   ABDOMINAL HYSTERECTOMY  2001   BREAST LUMPECTOMY Right 07/02/2011   right breast and SNL   BREAST SURGERY Left 1972   left lumpectomy   BREAST SURGERY Right 08/11/2011   margins on rt breast   CATARACT EXTRACTION W/PHACO Right 08/31/2016   Procedure: CATARACT EXTRACTION PHACO AND INTRAOCULAR LENS PLACEMENT (IOC);  Surgeon: Oneil Platts, MD;  Location: [facility];  Service: Ophthalmology;  Laterality: Right;  CDE: 8.46   CATARACT EXTRACTION W/PHACO Left 09/14/2016   Procedure: CATARACT EXTRACTION PHACO AND INTRAOCULAR LENS PLACEMENT (IOC);  Surgeon: Oneil Platts, MD;  Location: [facility];  Service: Ophthalmology;  Laterality:  Left;  CDE: 9.32   COLONOSCOPY     HEMORRHOID SURGERY     LEFT HEART CATH AND CORONARY ANGIOGRAPHY N/A 12/17/2021   Procedure: LEFT HEART CATH AND CORONARY ANGIOGRAPHY;  Surgeon: Burnard Debby LABOR, MD;  Location: [facility];  Service: Cardiovascular;  Laterality: N/A;   TRANSTHORACIC ECHOCARDIOGRAM  2009   normal LV & RV systolic function; mild mitral annular calcif & mild MR; trace TR     reports that she has never smoked. She has been exposed to tobacco smoke. She has never used smokeless tobacco. She reports that she does not drink alcohol and does not use drugs.  Allergies  Allergen Reactions   Bisoprolol-Hydrochlorothiazide  Other (See  Comments)    Unknown   Hydrocodone Other (See Comments)    Unknown   Ziac [Bisoprolol-Hydrochlorothiazide ] Other (See Comments)    Unknown Patient taking hydrochlorothiazide  with no reaction.   Lipitor  [Atorvastatin ] Itching   Lisinopril Itching   Oxycodone  Itching   Oxycodone  Hcl Itching   Penicillins Rash         Family History  Problem Relation Age of Onset   Colon cancer Mother    Stroke Mother    Diabetes Mother    Pancreatic cancer Father    Heart disease Father    Heart attack Father    Hypertension Father    Cirrhosis Sister    Kidney disease Sister    Lung cancer Sister    Other Brother        died at 74 months old   Hyperlipidemia Daughter    Gallbladder disease Daughter    Anxiety disorder Daughter    Diabetes Daughter    Thyroid  disease Daughter    Heart attack Son    Hypertension Son    Deafness Son    Anesthesia problems Neg Hx    Hypotension Neg Hx    Malignant hyperthermia Neg Hx    Pseudochol deficiency Neg Hx    Stomach cancer Neg Hx    Rectal cancer Neg Hx     Prior to Admission medications   Medication Sig Start Date End Date Taking? Authorizing Provider  amLODipine  (NORVASC ) 5 MG tablet TAKE 1 TABLET(5 MG) BY MOUTH DAILY 05/04/24   Mallipeddi, Vishnu P, MD  carvedilol  (COREG ) 6.25 MG tablet Take 6.25 mg by mouth 2 (two) times daily. 03/22/24   [provider]  citalopram  (CELEXA ) 20 MG tablet Take 20 mg by mouth daily. 11/19/23   [provider]  famotidine (PEPCID) 20 MG tablet Take 40 mg by mouth daily. Patient not taking: Reported on 04/09/2024 11/19/23   [provider]  gabapentin  (NEURONTIN ) 100 MG capsule Take 1 capsule (100 mg total) by mouth 3 (three) times daily. 03/09/24   Jane Sand, NP  hydrochlorothiazide  (HYDRODIURIL ) 12.5 MG tablet Take 12.5 mg by mouth daily. 01/09/24   [provider]  levothyroxine  (SYNTHROID , LEVOTHROID) 50 MCG tablet Take 50 mcg by mouth daily.    [provider]   LORazepam  (ATIVAN ) 1 MG tablet Take 1 mg by mouth at bedtime. For anxiety may take extra tablet if needed    [provider]  nitroGLYCERIN  (NITROSTAT ) 0.4 MG SL tablet Place 1 tablet (0.4 mg total) under the tongue every 5 (five) minutes as needed for chest pain. 12/17/21   Lue Elsie BROCKS, MD  omeprazole  (PRILOSEC) 40 MG capsule TAKE 1 CAPSULE BY MOUTH SHORTLY BEFORE BREAKFAST DAILY. 11/16/21   Teressa Toribio SQUIBB, MD  psyllium (METAMUCIL) 58.6 % powder Take  1 packet by mouth daily as needed (constipation).    [provider]  rosuvastatin  (CRESTOR ) 40 MG tablet TAKE 1 TABLET(40 MG) BY MOUTH DAILY 10/12/23   Parthenia Olivia HERO, PA-C  Spacer/Aero-Holding Chambers (AEROCHAMBER PLUS) inhaler Use with inhaler 04/17/21   Van Knee, MD    Physical Exam: Vitals:   05/28/24 1510 05/28/24 1700 05/28/24 1715 05/28/24 1748  BP: 115/62 101/64 99/64 110/62  Pulse: 93 90 88 88  Resp: 18 (!) 35 (!) 27 18  Temp: 98.3 F (36.8 C)   98.9 F (37.2 C)  TempSrc: Oral   Oral  SpO2: 96% 96% 98% 98%    Constitutional: NAD, calm, comfortable Vitals:   05/28/24 1510 05/28/24 1700 05/28/24 1715 05/28/24 1748  BP: 115/62 101/64 99/64 110/62  Pulse: 93 90 88 88  Resp: 18 (!) 35 (!) 27 18  Temp: 98.3 F (36.8 C)   98.9 F (37.2 C)  TempSrc: Oral   Oral  SpO2: 96% 96% 98% 98%   Eyes: PERRL, lids and conjunctivae normal ENMT: Mucous membranes are moist.  Ears examined, no acute abnormality appreciated.  Unable to examine pharynx due to habitus. Neck: normal, supple, no masses, no thyromegaly Respiratory: clear to auscultation bilaterally, no wheezing, no crackles. Normal respiratory effort. No accessory muscle use.  Cardiovascular: Regular rate and rhythm, no murmurs / rubs / gallops. No extremity edema.  Extremities warm. Abdomen: no tenderness, no masses palpated. No hepatosplenomegaly. Bowel sounds positive.  Musculoskeletal: no clubbing / cyanosis. No joint deformity upper and  lower extremities.  Skin: no rashes, lesions, ulcers. No induration Neurologic: No facial asymmetry, moving extremity spontaneously, speech fluent. Psychiatric: Normal judgment and insight. Alert and oriented x 3. Normal mood.   Labs on Admission: I have personally reviewed following labs and imaging studies  CBC: Recent Labs  Lab 05/28/24 1041  WBC 7.7  NEUTROABS 5.8  HGB 11.1*  HCT 34.7*  MCV 85.3  PLT 217   Basic Metabolic Panel: Recent Labs  Lab 05/28/24 1041  NA 135  K 3.3*  CL 96*  CO2 30  GLUCOSE 118*  BUN 12  CREATININE 1.16*  CALCIUM  8.5*   Urine analysis:    Component Value Date/Time   COLORURINE STRAW (A) 06/29/2023 1449   APPEARANCEUR CLEAR 06/29/2023 1449   LABSPEC 1.006 06/29/2023 1449   PHURINE 6.0 06/29/2023 1449   GLUCOSEU NEGATIVE 06/29/2023 1449   HGBUR SMALL (A) 06/29/2023 1449   BILIRUBINUR NEGATIVE 06/29/2023 1449   KETONESUR NEGATIVE 06/29/2023 1449   PROTEINUR NEGATIVE 06/29/2023 1449   NITRITE NEGATIVE 06/29/2023 1449   LEUKOCYTESUR MODERATE (A) 06/29/2023 1449    Radiological Exams on Admission: CT Angio Chest PE W and/or Wo Contrast Result Date: 05/28/2024 CLINICAL DATA:  High probability for PE. Shortness of breath and fever. EXAM: CT ANGIOGRAPHY CHEST WITH CONTRAST TECHNIQUE: Multidetector CT imaging of the chest was performed using the standard protocol during bolus administration of intravenous contrast. Multiplanar CT image reconstructions and MIPs were obtained to evaluate the vascular anatomy. RADIATION DOSE REDUCTION: This exam was performed according to the departmental dose-optimization program which includes automated exposure control, adjustment of the mA and/or kV according to patient size and/or use of iterative reconstruction technique. CONTRAST:  OMNIPAQUE  IOHEXOL  350 MG/ML SOLN COMPARISON:  CT chest 04/29/2023 FINDINGS: Cardiovascular: Satisfactory opacification of the pulmonary arteries to the segmental level. No  evidence of pulmonary embolism. Normal heart size. No pericardial effusion. There are atherosclerotic calcifications of the aorta and coronary arteries. Mediastinum/Nodes: No enlarged  mediastinal, hilar, or axillary lymph nodes. Thyroid  gland, trachea, and esophagus demonstrate no significant findings. There is a small hiatal hernia. Lungs/Pleura: Lungs are clear. No pleural effusion or pneumothorax. Upper Abdomen: No acute abnormality. Musculoskeletal: No chest wall abnormality. No acute or significant osseous findings. Review of the MIP images confirms the above findings. IMPRESSION: 1. No evidence for pulmonary embolism. 2. No acute cardiopulmonary process. 3. Small hiatal hernia. 4. Aortic atherosclerosis. Aortic Atherosclerosis (ICD10-I70.0). Electronically Signed   By: Greig Pique M.D.   On: 05/28/2024 15:11   CT Soft Tissue Neck W Contrast Result Date: 05/28/2024 CLINICAL DATA:  Sore throat, fever and chills EXAM: CT NECK WITH CONTRAST TECHNIQUE: Multidetector CT imaging of the neck was performed using the standard protocol following the bolus administration of intravenous contrast. RADIATION DOSE REDUCTION: This exam was performed according to the departmental dose-optimization program which includes automated exposure control, adjustment of the mA and/or kV according to patient size and/or use of iterative reconstruction technique. CONTRAST:  OMNIPAQUE  IOHEXOL  350 MG/ML SOLN COMPARISON:  None Available. FINDINGS: Pharynx: The nasopharynx, oropharynx and hypopharynx are normal Oral cavity/floor of mouth: Normal Larynx: Normal Salivary glands: The parotid and submandibular glands are normal Thyroid : Normal Lymph nodes: No adenopathy Vascular: No significant abnormality Limited intracranial: No significant abnormality Visualized orbits: No significant abnormality Mastoids and visualized paranasal sinuses: No significant abnormality Skeleton: Severe degenerative disc disease at C5-6 and C6-7 Upper  chest: No significant abnormality Other: None IMPRESSION: No peritonsillar abscess or other significant abnormality Electronically Signed   By: Nancyann Burns M.D.   On: 05/28/2024 14:50   DG Neck Soft Tissue Result Date: 05/28/2024 CLINICAL DATA:  Sore throat with pain on swallowing. Evaluate for deep space infection. EXAM: NECK SOFT TISSUES - 1+ VIEW COMPARISON:  None Available. FINDINGS: There is no evidence of retropharyngeal soft tissue swelling or epiglottic enlargement. The cervical airway is unremarkable and no radio-opaque foreign body identified. Mild multilevel cervical spondylosis without evidence of acute osseous abnormality. IMPRESSION: No plain film evidence of deep space infection or airway compromise. CT should be considered for more definitive evaluation, as clinically warranted. Electronically Signed   By: Elsie Perone M.D.   On: 05/28/2024 11:36   DG Chest 2 View Result Date: 05/28/2024 CLINICAL DATA:  Shortness of breath and fever. EXAM: CHEST - 2 VIEW COMPARISON:  Chest radiograph dated 06/29/2023. FINDINGS: Shallow inspiration. No focal consolidation, pleural effusion or pneumothorax. Stable cardiac silhouette. No acute osseous pathology. IMPRESSION: No active cardiopulmonary disease. Electronically Signed   By: Vanetta Chou M.D.   On: 05/28/2024 11:35    EKG: Independently reviewed.  Sinus rhythm, rate 89, QTc prolonged at 521.  No significant change from prior.  Assessment/Plan Principal Problem:   Acute hypoxic respiratory failure (HCC) Active Problems:   SIRS (systemic inflammatory response syndrome) (HCC)   Hypothyroidism   Essential hypertension   Chronic kidney disease   Assessment and Plan:  Acute hypoxic respiratory failure-O2 sats 86 to 88% on room air, placed on 2 L.  Not on home O2, and took CPAP for OSA.  Never smoked cigarettes.  CTA chest negative for acute abnormality. Possible viral etiology, possible bronchitis, patient is status post DuoNebs and  steroids, with clear lungs on auscultation. - IM Dexamethasone  10 mg x 1 given in ED. - Continue prednisone  40 mg BID for possible viral bronchitis, low threshold to discontinue - Hold off on antibiotics at this time - Albuterol  nebs as needed - CBGs twice daily while on steroids  SIRS-febrile to 102.9, tachycardic heart rate 88-109, tachypnea respirate rate 16-22.  Presented with sore throat, congestion and cough, bilateral ear pain.  Positive sick contacts.  Likely viral etiology.  COVID influenza RSV negative.  She denies urinary symptoms, or GI symptoms.  Both CTA chest, CT neck soft tissue negative. - Hold off on antibiotics at this time - Check respiratory virus panel - Group A strep by PCR, mononucleosis screen negative. - Follow-up group A strep culture - Check UA - Follow-up blood cultures - Check lactic acid - N/s 75cc/hr x 15hrs  Hypokalemia-3.3. Check magnesium  Prolonged QT-521.  - Replete K, check magnesium  HTN-blood pressure soft systolic 94-147. - Hold antihypertensives carvedilol , Norvasc , HCTZ for now  OSA compliant with CPAP.  CKD stage IIIa.  Stable.  Creatinine 1.16.  DVT prophylaxis: Lovenox  Code Status: FULL-confirmed with patient and daughter Olam at bedside.   Family Communication: Daughter Olam at bedside. Disposition Plan: ~ 2 days Consults called: None  Admission status: Obs tele   Author: Tully FORBES Carwin, MD 05/28/2024 6:44 PM  For on call review [email or web address].

## 2024-05-29 DIAGNOSIS — J9601 Acute respiratory failure with hypoxia: Secondary | ICD-10-CM | POA: Diagnosis not present

## 2024-05-29 LAB — RESPIRATORY PANEL BY PCR

## 2024-05-29 LAB — GLUCOSE, CAPILLARY
Glucose-Capillary: 141 mg/dL — ABNORMAL HIGH (ref 70–99)
Glucose-Capillary: 147 mg/dL — ABNORMAL HIGH (ref 70–99)

## 2024-05-29 MED ORDER — MELATONIN 3 MG PO TABS
6.0000 mg | ORAL_TABLET | Freq: Every evening | ORAL | Status: DC | PRN
Start: 2024-05-29 — End: 2024-05-30
  Administered 2024-05-29: 6 mg via ORAL
  Filled 2024-05-29: qty 2

## 2024-05-29 MED ORDER — HYDROXYZINE HCL 10 MG PO TABS
10.0000 mg | ORAL_TABLET | Freq: Three times a day (TID) | ORAL | Status: DC | PRN
  Administered 2024-05-29: 10 mg via ORAL
  Filled 2024-05-29: qty 1

## 2024-05-29 NOTE — Progress Notes (Signed)
 SATURATION QUALIFICATIONS: (This note is used to comply with regulatory documentation for home oxygen)  Patient Saturations on Room Air at Rest = 98%  Patient Saturations on Room Air while Ambulating = 95%  Patient Saturations on 0 Liters of oxygen while Ambulating = 95%  Patient walked to the end of the hallway and back to her room with no signs of distress. No O2 needed during this walk, O2 stayed between 95%-98%. Care team notified.

## 2024-05-29 NOTE — Hospital Course (Signed)
 Jane Wilkerson is a 83 y.o. female with medical history significant for CKD 3a, hypertension, OSA, breast cancer presented to hospital with sore throat and odynophagia with pain radiating to the bilateral ears.  Her grandkids had similar illness as well.  In the ED patient was febrile with a temperature of 102.9 F.  Patient was slightly tachycardic and tachypneic.  Pulse ox was 86 to 88% on room air and was put on 2 L of oxygen by nasal cannula.  Group A streptococcus and mononucleosis was negative.  CTA of the chest was negative for acute findings.  CT soft tissue of the neck was unremarkable as well.  Labs showed WBC at 7.7.  Patient was given albuterol  dexamethasone  and was admitted to hospital for further evaluation and treatment.    Acute hypoxic respiratory failure Patient was hypoxic on presentation.  CTA of the chest without any acute findings.  Possible viral etiology.  Patient received dexamethasone  in the ED and has been transition to prednisone .  Hold off with antibiotic.     SIRS-had a fever tachypnea and tachycardia on presentation.  COVID influenza and RSV was negative.  CTA of the chest CT soft tissue negative as well.  Check respiratory viral panel group A streptococcus and mononucleosis was negative.  Check blood cultures.  Continue normal saline.    Hypokalemia Potassium was 3.3.  Check BMP in AM.   Prolonged QT-521.  Continue to replenish potassium.  Magnesium level at 2.1.   HTN-initial blood pressure was marginally low. Continue to hold antihypertensives carvedilol , Norvasc , HCTZ for now   OSA compliant with CPAP.   CKD stage IIIa.  Stable.  Creatinine 1.1

## 2024-05-29 NOTE — Plan of Care (Signed)

## 2024-05-29 NOTE — Progress Notes (Signed)
 [facility]  PROCEDURAL EXPEDITER PROGRESS NOTE  Patient Name: Jane Wilkerson  DOB:02-Jun-1941 Date of Admission: 05/28/2024  Date of Assessment:05/29/24   ------------------------------------------------------------------------------------------------------------------- No barriers noted at this time for d/c on 05/30/2024  -------------------------------------------------------------------------------------------------------------------  [facility], Ronal DELENA Bald Please contact us  directly via secure chat (search for [facility]) or by calling us  at [phone] [facility]).

## 2024-05-29 NOTE — Progress Notes (Signed)
 Mobility Specialist Progress Note:    05/29/24 1310  Mobility  Activity Ambulated with assistance  Level of Assistance Modified independent, requires aide device or extra time  Assistive Device Other (Comment) (IV pole)  Distance Ambulated (ft) 320 ft  Range of Motion/Exercises Active;All extremities  Activity Response Tolerated well  Mobility Referral Yes  Mobility visit 1 Mobility  Mobility Specialist Start Time (ACUTE ONLY) 1310  Mobility Specialist Stop Time (ACUTE ONLY) 1330  Mobility Specialist Time Calculation (min) (ACUTE ONLY) 20 min   Pt received in bed, agreeable to mobility. ModI to stand and ambulate using IV pole for support. Tolerated well, SpO2 97% on 1.5L at rest. SpO2 93-94% on 2L during ambulation, returned supine. All needs met.  Lian Tanori Mobility Specialist Please contact via Special Educational Needs Teacher or  Rehab office at [phone]

## 2024-05-29 NOTE — Progress Notes (Signed)
 PROGRESS NOTE  Jane Wilkerson FMW:[id number] DOB: 1941-01-09 DOA: 05/28/2024 PCP: Glennon Sand, NP (Inactive)   LOS: 0 days   Brief narrative:   Jane Wilkerson is a 83 y.o. female with medical history significant for CKD 3a, hypertension, OSA, breast cancer presented to hospital with sore throat and odynophagia with pain radiating to the bilateral ears.  Her grandkids had similar illness as well.  In the ED patient was febrile with a temperature of 102.9 F.  Patient was slightly tachycardic and tachypneic.  Pulse ox was 86 to 88% on room air and was put on 2 L of oxygen by nasal cannula.  Group A streptococcus and mononucleosis was negative.  CTA of the chest was negative for acute findings.  CT soft tissue of the neck was unremarkable as well.  Labs showed WBC at 7.7.  Patient was given albuterol , dexamethasone  and was admitted to hospital for further evaluation and treatment.   Assessment/Plan: Principal Problem:   Acute hypoxic respiratory failure (HCC) Active Problems:   SIRS (systemic inflammatory response syndrome) (HCC)   Hypothyroidism   Essential hypertension   Chronic kidney disease   Acute hypoxic respiratory failure Patient was hypoxic on presentation.  CTA of the chest without any acute findings.  Possible viral etiology.  Patient received dexamethasone  in the ED and has been transitioned to prednisone .  Hold off with antibiotic.  Still on 2 L of oxygen by nasal cannula.  Continue to wean off oxygen as able.  Continue bronchodilators.   SIRS-had a fever tachypnea and tachycardia on presentation.  COVID influenza and RSV was negative.  CTA of the chest CT soft tissue negative as well.  Check respiratory viral panel group A streptococcus and mononucleosis was negative.  Check blood cultures.  Continue normal saline.    Hypokalemia Potassium was 3.3.  Check BMP in AM.  No labs from today.   Prolonged QT-521.  Continue to replenish potassium.  Magnesium level at 2.1.    HTN-initial blood pressure was marginally low. Continue to hold antihypertensives carvedilol , Norvasc , HCTZ for now   OSA compliant with CPAP.   CKD stage IIIa.  Stable.  Creatinine 1.1   DVT prophylaxis: enoxaparin  (LOVENOX ) injection 40 mg Start: 05/28/24 2200   Disposition: Home likely in 1 to 2 days  Status is: Observation  The patient will require care spanning > 2 midnights and should be moved to inpatient because: Pending clinical improvement, on supplemental oxygen    Code Status:     Code Status: Full Code  Family Communication: None at bedside  Consultants: None  Procedures: None  Anti-infectives:  None  Anti-infectives (From admission, onward)    None        Subjective: Today, patient was seen and examined at bedside.  Patient complains of feeling little better today but is still has sore throat.  Could not sleep well in the nighttime.  Hard of hearing.  Complains of mild cough and shortness of breath.  Complains of generalized weakness.  Objective: Vitals:   05/28/24 1958 05/29/24 0517  BP: 107/64 (!) 104/56  Pulse: 90 83  Resp: 18 18  Temp: 97.7 F (36.5 C) 98.2 F (36.8 C)  SpO2: 97% 97%    Intake/Output Summary (Last 24 hours) at 05/29/2024 1040 Last data filed at 05/29/2024 0519 Gross per 24 hour  Intake 1254.86 ml  Output --  Net 1254.86 ml   Filed Weights   05/28/24 1833  Weight: 72.3 kg   Body mass index is  29.15 kg/m.   Physical Exam:  GENERAL: Patient is alert awake and oriented. Not in obvious distress.  On nasal cannula oxygen at 2 L/min, elderly female, hard of hearing HENT: No scleral pallor or icterus. Pupils equally reactive to light. Oral mucosa is moist NECK: is supple, no gross swelling noted. CHEST: Coarse breath sounds noted. CVS: S1 and S2 heard, no murmur. Regular rate and rhythm.  ABDOMEN: Soft, non-tender, bowel sounds are present. EXTREMITIES: No edema. CNS: Cranial nerves are intact. No focal motor  deficits. SKIN: warm and dry without rashes.  Data Review: I have personally reviewed the following laboratory data and studies,  CBC: Recent Labs  Lab 05/28/24 1041  WBC 7.7  NEUTROABS 5.8  HGB 11.1*  HCT 34.7*  MCV 85.3  PLT 217   Basic Metabolic Panel: Recent Labs  Lab 05/28/24 1041 05/28/24 1939  NA 135  --   K 3.3*  --   CL 96*  --   CO2 30  --   GLUCOSE 118*  --   BUN 12  --   CREATININE 1.16*  --   CALCIUM  8.5*  --   MG  --  2.1   Liver Function Tests: No results for input(s): AST, ALT, ALKPHOS, BILITOT, PROT, ALBUMIN in the last 168 hours. No results for input(s): LIPASE, AMYLASE in the last 168 hours. No results for input(s): AMMONIA in the last 168 hours. Cardiac Enzymes: No results for input(s): CKTOTAL, CKMB, CKMBINDEX, TROPONINI in the last 168 hours. BNP (last 3 results) No results for input(s): BNP in the last 8760 hours.  ProBNP (last 3 results) No results for input(s): PROBNP in the last 8760 hours.  CBG: Recent Labs  Lab 05/28/24 1958 05/29/24 0718  GLUCAP 258* 141*   Recent Results (from the past 240 hours)  Resp panel by RT-PCR (RSV, Flu A&B, Covid) Anterior Nasal Swab     Status: None   Collection Time: 05/28/24  8:39 AM   Specimen: Anterior Nasal Swab  Result Value Ref Range Status   SARS Coronavirus 2 by RT PCR NEGATIVE NEGATIVE Final    Comment: (NOTE) SARS-CoV-2 target nucleic acids are NOT DETECTED.  The SARS-CoV-2 RNA is generally detectable in upper respiratory specimens during the acute phase of infection. The lowest concentration of SARS-CoV-2 viral copies this assay can detect is 138 copies/mL. A negative result does not preclude SARS-Cov-2 infection and should not be used as the sole basis for treatment or other patient management decisions. A negative result may occur with  improper specimen collection/handling, submission of specimen other than nasopharyngeal swab, presence of viral  mutation(s) within the areas targeted by this assay, and inadequate number of viral copies(<138 copies/mL). A negative result must be combined with clinical observations, patient history, and epidemiological information. The expected result is Negative.  Fact Sheet for Patients:  bloggercourse.com  Fact Sheet for Healthcare Providers:  seriousbroker.it  This test is no t yet approved or cleared by the [place]  FDA and  has been authorized for detection and/or diagnosis of SARS-CoV-2 by FDA under an Emergency Use Authorization (EUA). This EUA will remain  in effect (meaning this test can be used) for the duration of the COVID-19 declaration under Section 564(b)(1) of the Act, 21 U.S.C.section 360bbb-3(b)(1), unless the authorization is terminated  or revoked sooner.       Influenza A by PCR NEGATIVE NEGATIVE Final   Influenza B by PCR NEGATIVE NEGATIVE Final    Comment: (NOTE) The Xpert Xpress SARS-CoV-2/FLU/RSV plus  assay is intended as an aid in the diagnosis of influenza from Nasopharyngeal swab specimens and should not be used as a sole basis for treatment. Nasal washings and aspirates are unacceptable for Xpert Xpress SARS-CoV-2/FLU/RSV testing.  Fact Sheet for Patients: bloggercourse.com  Fact Sheet for Healthcare Providers: seriousbroker.it  This test is not yet approved or cleared by the [place]  FDA and has been authorized for detection and/or diagnosis of SARS-CoV-2 by FDA under an Emergency Use Authorization (EUA). This EUA will remain in effect (meaning this test can be used) for the duration of the COVID-19 declaration under Section 564(b)(1) of the Act, 21 U.S.C. section 360bbb-3(b)(1), unless the authorization is terminated or revoked.     Resp Syncytial Virus by PCR NEGATIVE NEGATIVE Final    Comment: (NOTE) Fact Sheet for  Patients: bloggercourse.com  Fact Sheet for Healthcare Providers: seriousbroker.it  This test is not yet approved or cleared by the [place]  FDA and has been authorized for detection and/or diagnosis of SARS-CoV-2 by FDA under an Emergency Use Authorization (EUA). This EUA will remain in effect (meaning this test can be used) for the duration of the COVID-19 declaration under Section 564(b)(1) of the Act, 21 U.S.C. section 360bbb-3(b)(1), unless the authorization is terminated or revoked.  Performed at [facility], [address], [place], [place] [address]   Group A Strep by PCR     Status: None   Collection Time: 05/28/24 11:25 AM   Specimen: Throat; Sterile Swab  Result Value Ref Range Status   Group A Strep by PCR NOT DETECTED NOT DETECTED Final    Comment: Performed at [facility], [address] [place], [place] [address]  Respiratory (~20 pathogens) panel by PCR     Status: Abnormal   Collection Time: 05/28/24  6:24 PM   Specimen: Nasopharyngeal Swab; Respiratory  Result Value Ref Range Status   Adenovirus NOT DETECTED NOT DETECTED Final   Coronavirus 229E NOT DETECTED NOT DETECTED Final    Comment: (NOTE) The Coronavirus on the Respiratory Panel, DOES NOT test for the novel  Coronavirus (2019 nCoV)    Coronavirus HKU1 NOT DETECTED NOT DETECTED Final   Coronavirus NL63 NOT DETECTED NOT DETECTED Final   Coronavirus OC43 NOT DETECTED NOT DETECTED Final   Metapneumovirus NOT DETECTED NOT DETECTED Final   Rhinovirus / Enterovirus NOT DETECTED NOT DETECTED Final   Influenza A NOT DETECTED NOT DETECTED Final   Influenza B NOT DETECTED NOT DETECTED Final   Parainfluenza Virus 1 DETECTED (A) NOT DETECTED Final   Parainfluenza Virus 2 NOT DETECTED NOT DETECTED Final   Parainfluenza Virus 3 NOT DETECTED NOT DETECTED Final   Parainfluenza Virus 4 NOT DETECTED NOT DETECTED Final   Respiratory Syncytial Virus NOT DETECTED NOT  DETECTED Final   Bordetella pertussis NOT DETECTED NOT DETECTED Final   Bordetella Parapertussis NOT DETECTED NOT DETECTED Final   Chlamydophila pneumoniae NOT DETECTED NOT DETECTED Final   Mycoplasma pneumoniae NOT DETECTED NOT DETECTED Final    Comment: Performed at [facility], [address], [place], [place] [address]  Culture, blood (Routine X 2) w Reflex to ID Panel     Status: None (Preliminary result)   Collection Time: 05/28/24  7:37 PM   Specimen: BLOOD  Result Value Ref Range Status   Specimen Description BLOOD BLOOD RIGHT HAND  Final   Special Requests   Final    BOTTLES DRAWN AEROBIC AND ANAEROBIC Blood Culture adequate volume   Culture   Final  NO GROWTH < 12 HOURS Performed at [facility], [address] [place], [place] [address]    Report Status PENDING  Incomplete  Culture, blood (Routine X 2) w Reflex to ID Panel     Status: None (Preliminary result)   Collection Time: 05/28/24  7:39 PM   Specimen: BLOOD  Result Value Ref Range Status   Specimen Description BLOOD BLOOD RIGHT HAND  Final   Special Requests   Final    BOTTLES DRAWN AEROBIC AND ANAEROBIC Blood Culture adequate volume   Culture   Final    NO GROWTH < 12 HOURS Performed at [facility], [address] [place], [place] [address]    Report Status PENDING  Incomplete     Studies: CT Angio Chest PE W and/or Wo Contrast Result Date: 05/28/2024 CLINICAL DATA:  High probability for PE. Shortness of breath and fever. EXAM: CT ANGIOGRAPHY CHEST WITH CONTRAST TECHNIQUE: Multidetector CT imaging of the chest was performed using the standard protocol during bolus administration of intravenous contrast. Multiplanar CT image reconstructions and MIPs were obtained to evaluate the vascular anatomy. RADIATION DOSE REDUCTION: This exam was performed according to the departmental dose-optimization program which includes automated exposure control, adjustment of the mA and/or kV according to patient size  and/or use of iterative reconstruction technique. CONTRAST:  OMNIPAQUE  IOHEXOL  350 MG/ML SOLN COMPARISON:  CT chest 04/29/2023 FINDINGS: Cardiovascular: Satisfactory opacification of the pulmonary arteries to the segmental level. No evidence of pulmonary embolism. Normal heart size. No pericardial effusion. There are atherosclerotic calcifications of the aorta and coronary arteries. Mediastinum/Nodes: No enlarged mediastinal, hilar, or axillary lymph nodes. Thyroid  gland, trachea, and esophagus demonstrate no significant findings. There is a small hiatal hernia. Lungs/Pleura: Lungs are clear. No pleural effusion or pneumothorax. Upper Abdomen: No acute abnormality. Musculoskeletal: No chest wall abnormality. No acute or significant osseous findings. Review of the MIP images confirms the above findings. IMPRESSION: 1. No evidence for pulmonary embolism. 2. No acute cardiopulmonary process. 3. Small hiatal hernia. 4. Aortic atherosclerosis. Aortic Atherosclerosis (ICD10-I70.0). Electronically Signed   By: Greig Pique M.D.   On: 05/28/2024 15:11   CT Soft Tissue Neck W Contrast Result Date: 05/28/2024 CLINICAL DATA:  Sore throat, fever and chills EXAM: CT NECK WITH CONTRAST TECHNIQUE: Multidetector CT imaging of the neck was performed using the standard protocol following the bolus administration of intravenous contrast. RADIATION DOSE REDUCTION: This exam was performed according to the departmental dose-optimization program which includes automated exposure control, adjustment of the mA and/or kV according to patient size and/or use of iterative reconstruction technique. CONTRAST:  OMNIPAQUE  IOHEXOL  350 MG/ML SOLN COMPARISON:  None Available. FINDINGS: Pharynx: The nasopharynx, oropharynx and hypopharynx are normal Oral cavity/floor of mouth: Normal Larynx: Normal Salivary glands: The parotid and submandibular glands are normal Thyroid : Normal Lymph nodes: No adenopathy Vascular: No significant  abnormality Limited intracranial: No significant abnormality Visualized orbits: No significant abnormality Mastoids and visualized paranasal sinuses: No significant abnormality Skeleton: Severe degenerative disc disease at C5-6 and C6-7 Upper chest: No significant abnormality Other: None IMPRESSION: No peritonsillar abscess or other significant abnormality Electronically Signed   By: Nancyann Burns M.D.   On: 05/28/2024 14:50   DG Neck Soft Tissue Result Date: 05/28/2024 CLINICAL DATA:  Sore throat with pain on swallowing. Evaluate for deep space infection. EXAM: NECK SOFT TISSUES - 1+ VIEW COMPARISON:  None Available. FINDINGS: There is no evidence of retropharyngeal soft tissue swelling or epiglottic enlargement. The cervical airway is unremarkable and no  radio-opaque foreign body identified. Mild multilevel cervical spondylosis without evidence of acute osseous abnormality. IMPRESSION: No plain film evidence of deep space infection or airway compromise. CT should be considered for more definitive evaluation, as clinically warranted. Electronically Signed   By: Elsie Perone M.D.   On: 05/28/2024 11:36   DG Chest 2 View Result Date: 05/28/2024 CLINICAL DATA:  Shortness of breath and fever. EXAM: CHEST - 2 VIEW COMPARISON:  Chest radiograph dated 06/29/2023. FINDINGS: Shallow inspiration. No focal consolidation, pleural effusion or pneumothorax. Stable cardiac silhouette. No acute osseous pathology. IMPRESSION: No active cardiopulmonary disease. Electronically Signed   By: Vanetta Chou M.D.   On: 05/28/2024 11:35      Vernal Alstrom, MD  [facility] 05/29/2024  If 7PM-7AM, please contact night-coverage

## 2024-05-29 NOTE — Care Management Obs Status (Signed)
 MEDICARE OBSERVATION STATUS NOTIFICATION   Patient Details  Name: Jane Wilkerson MRN: [id number] Date of Birth: 1940-08-20   Medicare Observation Status Notification Given:  Yes    Duwaine LITTIE Ada 05/29/2024, 5:17 PM

## 2024-05-29 NOTE — Progress Notes (Signed)
  Transition of Care [facility]) Screening Note   Patient Details  Name: Jane Wilkerson Date of Birth: Mar 04, 1941   Transition of Care [facility]) CM/SW Contact:    Hoy DELENA Bigness, LCSW Phone Number: 05/29/2024, 8:59 AM    Transition of Care Department [facility]) has reviewed patient and no TOC needs have been identified at this time. We will continue to monitor patient advancement through interdisciplinary progression rounds. If new patient transition needs arise, please place a TOC consult.    05/29/24 0858  TOC Brief Assessment  Insurance and Status Reviewed  Patient has primary care physician Yes  Home environment has been reviewed From home w/ spouse  Prior level of function: Independent  Prior/Current Home Services No current home services  Social Drivers of Health Review SDOH reviewed no interventions necessary  Readmission risk has been reviewed Yes  Transition of care needs transition of care needs identified, TOC will continue to follow

## 2024-05-30 DIAGNOSIS — J9601 Acute respiratory failure with hypoxia: Secondary | ICD-10-CM | POA: Diagnosis not present

## 2024-05-30 DIAGNOSIS — B348 Other viral infections of unspecified site: Secondary | ICD-10-CM | POA: Diagnosis not present

## 2024-05-30 DIAGNOSIS — R6510 Systemic inflammatory response syndrome (SIRS) of non-infectious origin without acute organ dysfunction: Secondary | ICD-10-CM | POA: Diagnosis not present

## 2024-05-30 LAB — BASIC METABOLIC PANEL WITH GFR
Anion gap: 7 (ref 5–15)
BUN: 17 mg/dL (ref 8–23)
CO2: 30 mmol/L (ref 22–32)
Calcium: 8.5 mg/dL — ABNORMAL LOW (ref 8.9–10.3)
Chloride: 103 mmol/L (ref 98–111)
Creatinine, Ser: 1.25 mg/dL — ABNORMAL HIGH (ref 0.44–1.00)
GFR, Estimated: 43 mL/min — ABNORMAL LOW (ref >60)
Glucose, Bld: 100 mg/dL — ABNORMAL HIGH (ref 70–99)
Potassium: 4.5 mmol/L (ref 3.5–5.1)
Sodium: 140 mmol/L (ref 135–145)

## 2024-05-30 LAB — CBC
HCT: 32.2 % — ABNORMAL LOW (ref 36.0–46.0)
Hemoglobin: 10.1 g/dL — ABNORMAL LOW (ref 12.0–15.0)
MCH: 26.9 pg (ref 26.0–34.0)
MCHC: 31.4 g/dL (ref 30.0–36.0)
MCV: 85.6 fL (ref 80.0–100.0)
Platelets: 244 K/uL (ref 150–400)
RBC: 3.76 MIL/uL — ABNORMAL LOW (ref 3.87–5.11)
RDW: 15.3 % (ref 11.5–15.5)
WBC: 7.7 K/uL (ref 4.0–10.5)
nRBC: 0 % (ref 0.0–0.2)

## 2024-05-30 LAB — CULTURE, GROUP A STREP (THRC)

## 2024-05-30 LAB — GLUCOSE, CAPILLARY: Glucose-Capillary: 103 mg/dL — ABNORMAL HIGH (ref 70–99)

## 2024-05-30 LAB — MAGNESIUM: Magnesium: 2.6 mg/dL — ABNORMAL HIGH (ref 1.7–2.4)

## 2024-05-30 NOTE — Progress Notes (Signed)
 Mobility Specialist Progress Note:    05/30/24 0915  Mobility  Activity Ambulated with assistance  Level of Assistance Independent  Assistive Device None  Distance Ambulated (ft) 220 ft  Range of Motion/Exercises Active;All extremities  Activity Response Tolerated well  Mobility Referral Yes  Mobility visit 1 Mobility  Mobility Specialist Start Time (ACUTE ONLY) 0915  Mobility Specialist Stop Time (ACUTE ONLY) 0950  Mobility Specialist Time Calculation (min) (ACUTE ONLY) 35 min   Pt received in bed, agreeable to mobility. Independently able to stand and ambulate with no AD. Tolerated well, SpO2 91-93% on RA during ambulation. Left sitting EOB, all needs met.  Deundra Furber Mobility Specialist Please contact via Special Educational Needs Teacher or  Rehab office at [phone]

## 2024-05-30 NOTE — Plan of Care (Signed)

## 2024-05-30 NOTE — Discharge Summary (Signed)
 Physician Discharge Summary  Jane Wilkerson FMW:[id number] DOB: March 05, 1941 DOA: 05/28/2024  PCP: Glennon Sand, NP (Inactive)  Admit date: 05/28/2024 Discharge date: 05/30/2024  Admitted From: Home Disposition: Home  Recommendations for Outpatient Follow-up:  Follow up with PCP in 1 week  Follow up in ED if symptoms worsen or new appear   Home Health: No Equipment/Devices: None  Discharge Condition: Stable CODE STATUS: Full Diet recommendation: Heart healthy  Brief/Interim Summary:   Jane Wilkerson is a 83 y.o. female with medical history significant for CKD 3a, hypertension, OSA, breast cancer presented to hospital with sore throat and odynophagia with pain radiating to the bilateral ears.  Her grandkids had similar illness as well.  In the ED patient was febrile with a temperature of 102.9 F.  Patient was slightly tachycardic and tachypneic.  Pulse ox was 86 to 88% on room air and was put on 2 L of oxygen by nasal cannula.  Group A streptococcus and mononucleosis was negative.  CTA of the chest, CT soft tissue neck were negative for acute findings.    Patient was given albuterol , dexamethasone  and was admitted to hospital for further evaluation and treatment.   She was monitored off antibiotics.  Viral respiratory panel came positive for parainfluenza  likely cause for her symptoms.  She was ultimately discharged to home in a stable condition.  She was weaned off oxygen prior to discharge.  Initially her blood pressure was marginally low and antihypertensives were held.  She will continue her home medications at discharge.      Discharge Diagnoses:  Principal Problem:   Acute hypoxic respiratory failure (HCC) Active Problems:   SIRS (systemic inflammatory response syndrome) (HCC)   Parainfluenza infection   Hypothyroidism   Essential hypertension   Chronic kidney disease    Discharge Instructions  Discharge Instructions     Call MD for:  difficulty breathing,  headache or visual disturbances   Complete by: As directed    Call MD for:  temperature >100.4   Complete by: As directed    Diet - low sodium heart healthy   Complete by: As directed    Discharge instructions   Complete by: As directed    1. F/u with PCP in a week 2. Return to the ER if worsening symptoms   Increase activity slowly   Complete by: As directed       Allergies as of 05/30/2024       Reactions   Bisoprolol-hydrochlorothiazide  Other (See Comments)   Unknown   Hydrocodone Other (See Comments)   Unknown   Ziac [bisoprolol-hydrochlorothiazide ] Other (See Comments)   Unknown Patient taking hydrochlorothiazide  with no reaction.   Lipitor  [atorvastatin ] Itching   Lisinopril Itching   Oxycodone  Itching   Oxycodone  Hcl Itching   Penicillins Rash           Medication List     TAKE these medications    acetaminophen  500 MG tablet Commonly known as: TYLENOL  Take 500 mg by mouth every 6 (six) hours as needed for mild pain (pain score 1-3).   AeroChamber Plus inhaler Use with inhaler   amLODipine  5 MG tablet Commonly known as: NORVASC  TAKE 1 TABLET(5 MG) BY MOUTH DAILY What changed: See the new instructions.   carvedilol  6.25 MG tablet Commonly known as: COREG  Take 6.25 mg by mouth 2 (two) times daily.   citalopram  20 MG tablet Commonly known as: CELEXA  Take 20 mg by mouth daily.   gabapentin  100 MG capsule Commonly known as:  NEURONTIN  Take 1 capsule (100 mg total) by mouth 3 (three) times daily.   hydrochlorothiazide  12.5 MG tablet Commonly known as: HYDRODIURIL  Take 12.5 mg by mouth daily.   levothyroxine  50 MCG tablet Commonly known as: SYNTHROID  Take 50 mcg by mouth daily.   LORazepam  1 MG tablet Commonly known as: ATIVAN  Take 1 mg by mouth at bedtime. For anxiety may take extra tablet if needed   nitroGLYCERIN  0.4 MG SL tablet Commonly known as: NITROSTAT  Place 1 tablet (0.4 mg total) under the tongue every 5 (five) minutes as needed  for chest pain.   omeprazole  40 MG capsule Commonly known as: PRILOSEC TAKE 1 CAPSULE BY MOUTH SHORTLY BEFORE BREAKFAST DAILY.   psyllium 58.6 % powder Commonly known as: METAMUCIL Take 1 packet by mouth daily as needed (constipation).   rosuvastatin  40 MG tablet Commonly known as: CRESTOR  TAKE 1 TABLET(40 MG) BY MOUTH DAILY What changed: See the new instructions.        Follow-up Information     Glennon Sand, NP. Schedule an appointment as soon as possible for a visit in 3 day(s).   Specialty: Nurse Practitioner Contact information: [address] [place] [address] [phone]                Allergies  Allergen Reactions   Bisoprolol-Hydrochlorothiazide  Other (See Comments)    Unknown   Hydrocodone Other (See Comments)    Unknown   Ziac [Bisoprolol-Hydrochlorothiazide ] Other (See Comments)    Unknown Patient taking hydrochlorothiazide  with no reaction.   Lipitor  [Atorvastatin ] Itching   Lisinopril Itching   Oxycodone  Itching   Oxycodone  Hcl Itching   Penicillins Rash         Consultations:    Procedures/Studies: CT Angio Chest PE W and/or Wo Contrast Result Date: 05/28/2024 CLINICAL DATA:  High probability for PE. Shortness of breath and fever. EXAM: CT ANGIOGRAPHY CHEST WITH CONTRAST TECHNIQUE: Multidetector CT imaging of the chest was performed using the standard protocol during bolus administration of intravenous contrast. Multiplanar CT image reconstructions and MIPs were obtained to evaluate the vascular anatomy. RADIATION DOSE REDUCTION: This exam was performed according to the departmental dose-optimization program which includes automated exposure control, adjustment of the mA and/or kV according to patient size and/or use of iterative reconstruction technique. CONTRAST:  OMNIPAQUE  IOHEXOL  350 MG/ML SOLN COMPARISON:  CT chest 04/29/2023 FINDINGS: Cardiovascular: Satisfactory opacification of the pulmonary arteries  to the segmental level. No evidence of pulmonary embolism. Normal heart size. No pericardial effusion. There are atherosclerotic calcifications of the aorta and coronary arteries. Mediastinum/Nodes: No enlarged mediastinal, hilar, or axillary lymph nodes. Thyroid  gland, trachea, and esophagus demonstrate no significant findings. There is a small hiatal hernia. Lungs/Pleura: Lungs are clear. No pleural effusion or pneumothorax. Upper Abdomen: No acute abnormality. Musculoskeletal: No chest wall abnormality. No acute or significant osseous findings. Review of the MIP images confirms the above findings. IMPRESSION: 1. No evidence for pulmonary embolism. 2. No acute cardiopulmonary process. 3. Small hiatal hernia. 4. Aortic atherosclerosis. Aortic Atherosclerosis (ICD10-I70.0). Electronically Signed   By: Greig Pique M.D.   On: 05/28/2024 15:11   CT Soft Tissue Neck W Contrast Result Date: 05/28/2024 CLINICAL DATA:  Sore throat, fever and chills EXAM: CT NECK WITH CONTRAST TECHNIQUE: Multidetector CT imaging of the neck was performed using the standard protocol following the bolus administration of intravenous contrast. RADIATION DOSE REDUCTION: This exam was performed according to the departmental dose-optimization program which includes automated exposure control, adjustment of the mA  and/or kV according to patient size and/or use of iterative reconstruction technique. CONTRAST:  OMNIPAQUE  IOHEXOL  350 MG/ML SOLN COMPARISON:  None Available. FINDINGS: Pharynx: The nasopharynx, oropharynx and hypopharynx are normal Oral cavity/floor of mouth: Normal Larynx: Normal Salivary glands: The parotid and submandibular glands are normal Thyroid : Normal Lymph nodes: No adenopathy Vascular: No significant abnormality Limited intracranial: No significant abnormality Visualized orbits: No significant abnormality Mastoids and visualized paranasal sinuses: No significant abnormality Skeleton: Severe degenerative disc  disease at C5-6 and C6-7 Upper chest: No significant abnormality Other: None IMPRESSION: No peritonsillar abscess or other significant abnormality Electronically Signed   By: Nancyann Burns M.D.   On: 05/28/2024 14:50   DG Neck Soft Tissue Result Date: 05/28/2024 CLINICAL DATA:  Sore throat with pain on swallowing. Evaluate for deep space infection. EXAM: NECK SOFT TISSUES - 1+ VIEW COMPARISON:  None Available. FINDINGS: There is no evidence of retropharyngeal soft tissue swelling or epiglottic enlargement. The cervical airway is unremarkable and no radio-opaque foreign body identified. Mild multilevel cervical spondylosis without evidence of acute osseous abnormality. IMPRESSION: No plain film evidence of deep space infection or airway compromise. CT should be considered for more definitive evaluation, as clinically warranted. Electronically Signed   By: Elsie Perone M.D.   On: 05/28/2024 11:36   DG Chest 2 View Result Date: 05/28/2024 CLINICAL DATA:  Shortness of breath and fever. EXAM: CHEST - 2 VIEW COMPARISON:  Chest radiograph dated 06/29/2023. FINDINGS: Shallow inspiration. No focal consolidation, pleural effusion or pneumothorax. Stable cardiac silhouette. No acute osseous pathology. IMPRESSION: No active cardiopulmonary disease. Electronically Signed   By: Vanetta Chou M.D.   On: 05/28/2024 11:35      Subjective:   Discharge Exam: Vitals:   05/29/24 2026 05/30/24 0407  BP: 107/73 104/73  Pulse: 86 84  Resp: 18 18  Temp: 98.9 F (37.2 C) 98.7 F (37.1 C)  SpO2: 96% 97%    General: Pt is alert, awake, not in acute distress Cardiovascular: rate controlled, S1/S2 + Respiratory: bilateral decreased breath sounds at bases Abdominal: Soft, NT, ND, bowel sounds + Extremities: no edema, no cyanosis    The results of significant diagnostics from this hospitalization (including imaging, microbiology, ancillary and laboratory) are listed below for reference.      Microbiology: Recent Results (from the past 240 hours)  Resp panel by RT-PCR (RSV, Flu A&B, Covid) Anterior Nasal Swab     Status: None   Collection Time: 05/28/24  8:39 AM   Specimen: Anterior Nasal Swab  Result Value Ref Range Status   SARS Coronavirus 2 by RT PCR NEGATIVE NEGATIVE Final    Comment: (NOTE) SARS-CoV-2 target nucleic acids are NOT DETECTED.  The SARS-CoV-2 RNA is generally detectable in upper respiratory specimens during the acute phase of infection. The lowest concentration of SARS-CoV-2 viral copies this assay can detect is 138 copies/mL. A negative result does not preclude SARS-Cov-2 infection and should not be used as the sole basis for treatment or other patient management decisions. A negative result may occur with  improper specimen collection/handling, submission of specimen other than nasopharyngeal swab, presence of viral mutation(s) within the areas targeted by this assay, and inadequate number of viral copies(<138 copies/mL). A negative result must be combined with clinical observations, patient history, and epidemiological information. The expected result is Negative.  Fact Sheet for Patients:  bloggercourse.com  Fact Sheet for Healthcare Providers:  seriousbroker.it  This test is no t yet approved or cleared by the [place]  FDA and  has been authorized for detection and/or diagnosis of SARS-CoV-2 by FDA under an Emergency Use Authorization (EUA). This EUA will remain  in effect (meaning this test can be used) for the duration of the COVID-19 declaration under Section 564(b)(1) of the Act, 21 U.S.C.section 360bbb-3(b)(1), unless the authorization is terminated  or revoked sooner.       Influenza A by PCR NEGATIVE NEGATIVE Final   Influenza B by PCR NEGATIVE NEGATIVE Final    Comment: (NOTE) The Xpert Xpress SARS-CoV-2/FLU/RSV plus assay is intended as an aid in the diagnosis of  influenza from Nasopharyngeal swab specimens and should not be used as a sole basis for treatment. Nasal washings and aspirates are unacceptable for Xpert Xpress SARS-CoV-2/FLU/RSV testing.  Fact Sheet for Patients: bloggercourse.com  Fact Sheet for Healthcare Providers: seriousbroker.it  This test is not yet approved or cleared by the [place]  FDA and has been authorized for detection and/or diagnosis of SARS-CoV-2 by FDA under an Emergency Use Authorization (EUA). This EUA will remain in effect (meaning this test can be used) for the duration of the COVID-19 declaration under Section 564(b)(1) of the Act, 21 U.S.C. section 360bbb-3(b)(1), unless the authorization is terminated or revoked.     Resp Syncytial Virus by PCR NEGATIVE NEGATIVE Final    Comment: (NOTE) Fact Sheet for Patients: bloggercourse.com  Fact Sheet for Healthcare Providers: seriousbroker.it  This test is not yet approved or cleared by the [place]  FDA and has been authorized for detection and/or diagnosis of SARS-CoV-2 by FDA under an Emergency Use Authorization (EUA). This EUA will remain in effect (meaning this test can be used) for the duration of the COVID-19 declaration under Section 564(b)(1) of the Act, 21 U.S.C. section 360bbb-3(b)(1), unless the authorization is terminated or revoked.  Performed at [facility], [address], [place], [place] [address]   Culture, group A strep     Status: None   Collection Time: 05/28/24  8:39 AM   Specimen: Throat  Result Value Ref Range Status   Specimen Description   Final    THROAT Performed at [facility], [address] [place], [place] [address]    Special Requests   Final    NONE Performed at [facility], [address] [place], [place] [address]    Culture   Final    NO GROUP A STREP (S.PYOGENES) ISOLATED Performed at [facility], [address] [place], [place] [address]    Report Status 05/30/2024 FINAL  Final  Group A Strep by PCR     Status: None   Collection Time: 05/28/24 11:25 AM   Specimen: Throat; Sterile Swab  Result Value Ref Range Status   Group A Strep by PCR NOT DETECTED NOT DETECTED Final    Comment: Performed at [facility], [address], [place], [place] [address]  Respiratory (~20 pathogens) panel by PCR     Status: Abnormal   Collection Time: 05/28/24  6:24 PM   Specimen: Nasopharyngeal Swab; Respiratory  Result Value Ref Range Status   Adenovirus NOT DETECTED NOT DETECTED Final   Coronavirus 229E NOT DETECTED NOT DETECTED Final    Comment: (NOTE) The Coronavirus on the Respiratory Panel, DOES NOT test for the novel  Coronavirus (2019 nCoV)    Coronavirus HKU1 NOT DETECTED NOT DETECTED Final   Coronavirus NL63 NOT DETECTED NOT DETECTED Final   Coronavirus OC43 NOT DETECTED NOT DETECTED Final   Metapneumovirus NOT DETECTED NOT DETECTED Final   Rhinovirus / Enterovirus NOT  DETECTED NOT DETECTED Final   Influenza A NOT DETECTED NOT DETECTED Final   Influenza B NOT DETECTED NOT DETECTED Final   Parainfluenza Virus 1 DETECTED (A) NOT DETECTED Final   Parainfluenza Virus 2 NOT DETECTED NOT DETECTED Final   Parainfluenza Virus 3 NOT DETECTED NOT DETECTED Final   Parainfluenza Virus 4 NOT DETECTED NOT DETECTED Final   Respiratory Syncytial Virus NOT DETECTED NOT DETECTED Final   Bordetella pertussis NOT DETECTED NOT DETECTED Final   Bordetella Parapertussis NOT DETECTED NOT DETECTED Final   Chlamydophila pneumoniae NOT DETECTED NOT DETECTED Final   Mycoplasma pneumoniae NOT DETECTED NOT DETECTED Final    Comment: Performed at [facility], [address] [place], [place] [address]  Culture, blood (Routine X 2) w Reflex to ID Panel     Status: None (Preliminary result)   Collection Time: 05/28/24  7:37 PM   Specimen: BLOOD  Result Value Ref Range Status   Specimen  Description BLOOD BLOOD RIGHT HAND  Final   Special Requests   Final    BOTTLES DRAWN AEROBIC AND ANAEROBIC Blood Culture adequate volume   Culture   Final    NO GROWTH 2 DAYS Performed at [facility], [address], [place], [place] [address]    Report Status PENDING  Incomplete  Culture, blood (Routine X 2) w Reflex to ID Panel     Status: None (Preliminary result)   Collection Time: 05/28/24  7:39 PM   Specimen: BLOOD  Result Value Ref Range Status   Specimen Description BLOOD BLOOD RIGHT HAND  Final   Special Requests   Final    BOTTLES DRAWN AEROBIC AND ANAEROBIC Blood Culture adequate volume   Culture   Final    NO GROWTH 2 DAYS Performed at [facility], [address], [place], [place] [address]    Report Status PENDING  Incomplete     Labs: BNP (last 3 results) No results for input(s): BNP in the last 8760 hours. Basic Metabolic Panel: Recent Labs  Lab 05/28/24 1041 05/28/24 1939 05/30/24 0413  NA 135  --  140  K 3.3*  --  4.5  CL 96*  --  103  CO2 30  --  30  GLUCOSE 118*  --  100*  BUN 12  --  17  CREATININE 1.16*  --  1.25*  CALCIUM  8.5*  --  8.5*  MG  --  2.1 2.6*   Liver Function Tests: No results for input(s): AST, ALT, ALKPHOS, BILITOT, PROT, ALBUMIN in the last 168 hours. No results for input(s): LIPASE, AMYLASE in the last 168 hours. No results for input(s): AMMONIA in the last 168 hours. CBC: Recent Labs  Lab 05/28/24 1041 05/30/24 0413  WBC 7.7 7.7  NEUTROABS 5.8  --   HGB 11.1* 10.1*  HCT 34.7* 32.2*  MCV 85.3 85.6  PLT 217 244   Cardiac Enzymes: No results for input(s): CKTOTAL, CKMB, CKMBINDEX, TROPONINI in the last 168 hours. BNP: Invalid input(s): POCBNP CBG: Recent Labs  Lab 05/28/24 1958 05/29/24 0718 05/29/24 2036 05/30/24 0729  GLUCAP 258* 141* 147* 103*   D-Dimer No results for input(s): DDIMER in the last 72 hours. Hgb A1c No results for input(s): HGBA1C in the last 72  hours. Lipid Profile No results for input(s): CHOL, HDL, LDLCALC, TRIG, CHOLHDL, LDLDIRECT in the last 72 hours. Thyroid  function studies No results for input(s): TSH, T4TOTAL, T3FREE, THYROIDAB in the last 72 hours.  Invalid input(s): FREET3 Anemia work up No  results for input(s): VITAMINB12, FOLATE, FERRITIN, TIBC, IRON, RETICCTPCT in the last 72 hours. Urinalysis    Component Value Date/Time   COLORURINE YELLOW 05/28/2024 2330   APPEARANCEUR CLEAR 05/28/2024 2330   LABSPEC >1.046 (H) 05/28/2024 2330   PHURINE 6.0 05/28/2024 2330   GLUCOSEU 50 (A) 05/28/2024 2330   HGBUR NEGATIVE 05/28/2024 2330   BILIRUBINUR NEGATIVE 05/28/2024 2330   KETONESUR NEGATIVE 05/28/2024 2330   PROTEINUR 30 (A) 05/28/2024 2330   NITRITE NEGATIVE 05/28/2024 2330   LEUKOCYTESUR NEGATIVE 05/28/2024 2330   Sepsis Labs Recent Labs  Lab 05/28/24 1041 05/30/24 0413  WBC 7.7 7.7   Microbiology Recent Results (from the past 240 hours)  Resp panel by RT-PCR (RSV, Flu A&B, Covid) Anterior Nasal Swab     Status: None   Collection Time: 05/28/24  8:39 AM   Specimen: Anterior Nasal Swab  Result Value Ref Range Status   SARS Coronavirus 2 by RT PCR NEGATIVE NEGATIVE Final    Comment: (NOTE) SARS-CoV-2 target nucleic acids are NOT DETECTED.  The SARS-CoV-2 RNA is generally detectable in upper respiratory specimens during the acute phase of infection. The lowest concentration of SARS-CoV-2 viral copies this assay can detect is 138 copies/mL. A negative result does not preclude SARS-Cov-2 infection and should not be used as the sole basis for treatment or other patient management decisions. A negative result may occur with  improper specimen collection/handling, submission of specimen other than nasopharyngeal swab, presence of viral mutation(s) within the areas targeted by this assay, and inadequate number of viral copies(<138 copies/mL). A negative result must be  combined with clinical observations, patient history, and epidemiological information. The expected result is Negative.  Fact Sheet for Patients:  bloggercourse.com  Fact Sheet for Healthcare Providers:  seriousbroker.it  This test is no t yet approved or cleared by the [place]  FDA and  has been authorized for detection and/or diagnosis of SARS-CoV-2 by FDA under an Emergency Use Authorization (EUA). This EUA will remain  in effect (meaning this test can be used) for the duration of the COVID-19 declaration under Section 564(b)(1) of the Act, 21 U.S.C.section 360bbb-3(b)(1), unless the authorization is terminated  or revoked sooner.       Influenza A by PCR NEGATIVE NEGATIVE Final   Influenza B by PCR NEGATIVE NEGATIVE Final    Comment: (NOTE) The Xpert Xpress SARS-CoV-2/FLU/RSV plus assay is intended as an aid in the diagnosis of influenza from Nasopharyngeal swab specimens and should not be used as a sole basis for treatment. Nasal washings and aspirates are unacceptable for Xpert Xpress SARS-CoV-2/FLU/RSV testing.  Fact Sheet for Patients: bloggercourse.com  Fact Sheet for Healthcare Providers: seriousbroker.it  This test is not yet approved or cleared by the [place]  FDA and has been authorized for detection and/or diagnosis of SARS-CoV-2 by FDA under an Emergency Use Authorization (EUA). This EUA will remain in effect (meaning this test can be used) for the duration of the COVID-19 declaration under Section 564(b)(1) of the Act, 21 U.S.C. section 360bbb-3(b)(1), unless the authorization is terminated or revoked.     Resp Syncytial Virus by PCR NEGATIVE NEGATIVE Final    Comment: (NOTE) Fact Sheet for Patients: bloggercourse.com  Fact Sheet for Healthcare Providers: seriousbroker.it  This test is not yet  approved or cleared by the [place]  FDA and has been authorized for detection and/or diagnosis of SARS-CoV-2 by FDA under an Emergency Use Authorization (EUA). This EUA will remain in effect (meaning this test can be used) for the  duration of the COVID-19 declaration under Section 564(b)(1) of the Act, 21 U.S.C. section 360bbb-3(b)(1), unless the authorization is terminated or revoked.  Performed at [facility], [address], [place], [place] [address]   Culture, group A strep     Status: None   Collection Time: 05/28/24  8:39 AM   Specimen: Throat  Result Value Ref Range Status   Specimen Description   Final    THROAT Performed at [facility], [address] [place], [place] [address]    Special Requests   Final    NONE Performed at [facility], [address], [place], [place] [address]    Culture   Final    NO GROUP A STREP (S.PYOGENES) ISOLATED Performed at [facility], [address] [place], [place] [address]    Report Status 05/30/2024 FINAL  Final  Group A Strep by PCR     Status: None   Collection Time: 05/28/24 11:25 AM   Specimen: Throat; Sterile Swab  Result Value Ref Range Status   Group A Strep by PCR NOT DETECTED NOT DETECTED Final    Comment: Performed at [facility], [address] [place], [place] [address]  Respiratory (~20 pathogens) panel by PCR     Status: Abnormal   Collection Time: 05/28/24  6:24 PM   Specimen: Nasopharyngeal Swab; Respiratory  Result Value Ref Range Status   Adenovirus NOT DETECTED NOT DETECTED Final   Coronavirus 229E NOT DETECTED NOT DETECTED Final    Comment: (NOTE) The Coronavirus on the Respiratory Panel, DOES NOT test for the novel  Coronavirus (2019 nCoV)    Coronavirus HKU1 NOT DETECTED NOT DETECTED Final   Coronavirus NL63 NOT DETECTED NOT DETECTED Final   Coronavirus OC43 NOT DETECTED NOT DETECTED Final   Metapneumovirus NOT DETECTED NOT DETECTED Final   Rhinovirus / Enterovirus NOT DETECTED NOT  DETECTED Final   Influenza A NOT DETECTED NOT DETECTED Final   Influenza B NOT DETECTED NOT DETECTED Final   Parainfluenza Virus 1 DETECTED (A) NOT DETECTED Final   Parainfluenza Virus 2 NOT DETECTED NOT DETECTED Final   Parainfluenza Virus 3 NOT DETECTED NOT DETECTED Final   Parainfluenza Virus 4 NOT DETECTED NOT DETECTED Final   Respiratory Syncytial Virus NOT DETECTED NOT DETECTED Final   Bordetella pertussis NOT DETECTED NOT DETECTED Final   Bordetella Parapertussis NOT DETECTED NOT DETECTED Final   Chlamydophila pneumoniae NOT DETECTED NOT DETECTED Final   Mycoplasma pneumoniae NOT DETECTED NOT DETECTED Final    Comment: Performed at [facility], [address], [place], [place] [address]  Culture, blood (Routine X 2) w Reflex to ID Panel     Status: None (Preliminary result)   Collection Time: 05/28/24  7:37 PM   Specimen: BLOOD  Result Value Ref Range Status   Specimen Description BLOOD BLOOD RIGHT HAND  Final   Special Requests   Final    BOTTLES DRAWN AEROBIC AND ANAEROBIC Blood Culture adequate volume   Culture   Final    NO GROWTH 2 DAYS Performed at [facility], [address], [place], [place] [address]    Report Status PENDING  Incomplete  Culture, blood (Routine X 2) w Reflex to ID Panel     Status: None (Preliminary result)   Collection Time: 05/28/24  7:39 PM   Specimen: BLOOD  Result Value Ref Range Status   Specimen Description BLOOD BLOOD RIGHT HAND  Final   Special Requests   Final    BOTTLES  DRAWN AEROBIC AND ANAEROBIC Blood Culture adequate volume   Culture   Final    NO GROWTH 2 DAYS Performed at [facility], [address] [place], [place] [address]    Report Status PENDING  Incomplete     Time coordinating discharge: 35 minutes  SIGNED:   Derryl Duval, MD  [facility] 05/30/2024, 10:55 AM

## 2024-06-02 LAB — CULTURE, BLOOD (ROUTINE X 2)
Culture: NO GROWTH
Culture: NO GROWTH
Special Requests: ADEQUATE
Special Requests: ADEQUATE

## 2024-06-08 ENCOUNTER — Ambulatory Visit: Admitting: Nurse Practitioner

## 2024-06-13 ENCOUNTER — Ambulatory Visit: Payer: Self-pay

## 2024-06-13 VITALS — BP 121/77 | HR 87 | Resp 16 | Ht 62.0 in | Wt 158.1 lb

## 2024-06-13 DIAGNOSIS — N1831 Chronic kidney disease, stage 3a: Secondary | ICD-10-CM | POA: Diagnosis not present

## 2024-06-13 DIAGNOSIS — Z23 Encounter for immunization: Secondary | ICD-10-CM

## 2024-06-13 DIAGNOSIS — B348 Other viral infections of unspecified site: Secondary | ICD-10-CM | POA: Diagnosis not present

## 2024-06-13 DIAGNOSIS — J9601 Acute respiratory failure with hypoxia: Secondary | ICD-10-CM

## 2024-06-13 NOTE — Progress Notes (Addendum)
 "  Established Patient Office Visit  Subjective   Patient ID: Jane Wilkerson, female    DOB: 1941-01-20  Age: 83 y.o. MRN: [id number]  Chief Complaint  Patient presents with   Hospitalization Follow-up    Admitted 11/24-11/26 for respiratory failure     HPI  Jane Wilkerson is a 83 y.o. female with medical history significant for CKD 3a, hypertension, OSA, breast cancer presented to hospital with sore throat and odynophagia with pain radiating to the bilateral ears.  Her grandkids had similar illness as well.  In the ED patient was febrile with a temperature of 102.9 F.  Patient was slightly tachycardic and tachypneic.  Pulse ox was 86 to 88% on room air and was put on 2 L of oxygen by nasal cannula.  Group A streptococcus and mononucleosis was negative.  CTA of the chest, CT soft tissue neck were negative for acute findings.    Patient was given albuterol , dexamethasone  and was admitted to hospital for further evaluation and treatment.    She was monitored off antibiotics.  Viral respiratory panel came positive for parainfluenza  likely cause for her symptoms.  She was ultimately discharged to home in a stable condition.  She was weaned off oxygen prior to discharge.   Initially her blood pressure was marginally low and antihypertensives were held.  She will continue her home medications at discharge.    Patient Active Problem List   Diagnosis Date Noted   Parainfluenza infection 05/30/2024   Acute hypoxic respiratory failure (HCC) 05/28/2024   Other chronic pain 03/09/2024   Pneumonia 06/30/2023   SIRS (systemic inflammatory response syndrome) (HCC) 06/29/2023   Chronic kidney disease 01/12/2023   CAD (coronary artery disease) 10/08/2022   Paroxysmal SVT (supraventricular tachycardia) 10/08/2022   OSA (obstructive sleep apnea) 10/08/2022   NSTEMI (non-ST elevated myocardial infarction) (HCC) 12/15/2021   Upper airway cough syndrome 11/19/2019   Fracture of left great toe 11/19/17  12/22/2017   Fracture of left great toe 12/22/2017   Dilatation of thoracic aorta 10/04/2016   Hypothyroidism 10/04/2016   Essential hypertension 10/04/2016   Chest pressure 08/15/2015   DOE (dyspnea on exertion) 08/15/2015   Dyslipidemia 05/08/2013   Palpitations 05/08/2013   Anxiety 07/07/2011   History of right breast cancer 06/16/2011    ROS    Objective:     BP 121/77   Pulse 87   Resp 16   Ht 5' 2 (1.575 m)   Wt 158 lb 1.3 oz (71.7 kg)   SpO2 93%   BMI 28.91 kg/m  BP Readings from Last 3 Encounters:  06/13/24 121/77  05/30/24 104/73  04/09/24 130/80   Wt Readings from Last 3 Encounters:  06/13/24 158 lb 1.3 oz (71.7 kg)  05/28/24 159 lb 6.3 oz (72.3 kg)  04/09/24 163 lb 3.2 oz (74 kg)     Physical Exam Vitals and nursing note reviewed.  Constitutional:      Appearance: Normal appearance.  HENT:     Head: Normocephalic.  Eyes:     Extraocular Movements: Extraocular movements intact.     Pupils: Pupils are equal, round, and reactive to light.  Cardiovascular:     Rate and Rhythm: Normal rate and regular rhythm.  Pulmonary:     Effort: Pulmonary effort is normal.     Breath sounds: Normal breath sounds.  Musculoskeletal:     Cervical back: Normal range of motion and neck supple.  Neurological:     Mental Status: She is alert  and oriented to person, place, and time.  Psychiatric:        Mood and Affect: Mood normal.        Thought Content: Thought content normal.      No results found for any visits on 06/13/24.    The ASCVD Risk score (Arnett DK, et al., 2019) failed to calculate for the following reasons:   The 2019 ASCVD risk score is only valid for ages 63 to 55   Risk score cannot be calculated because patient has a medical history suggesting prior/existing ASCVD   * - Cholesterol units were assumed    Assessment & Plan:   Problem List Items Addressed This Visit       Genitourinary   Chronic kidney disease   Managed by  nephrologist. No current acute issues noted. - Continue follow-up with nephrologist Dr. Rachele. - Ensure lab work is completed as scheduled by nephrologist.      Other Visit Diagnoses       Acute respiratory failure with hypoxia (HCC)    -  Primary   Symptoms improved significantly. No current respiratory distress. Possible reactive airway disease considered. - Monitor symptoms and rest as needed.     Parainfluenza       Symptoms improved significantly. No current respiratory distress. Possible reactive airway disease considered. - Monitor symptoms and rest as needed.       Return in about 6 months (around 12/12/2024) for chronic follow-up with PCP.    Leita Longs, FNP  "

## 2024-06-14 NOTE — Assessment & Plan Note (Signed)
 Managed by nephrologist. No current acute issues noted. - Continue follow-up with nephrologist Dr. Rachele. - Ensure lab work is completed as scheduled by nephrologist.

## 2024-06-19 ENCOUNTER — Other Ambulatory Visit: Payer: Self-pay

## 2024-06-19 DIAGNOSIS — D0510 Intraductal carcinoma in situ of unspecified breast: Secondary | ICD-10-CM

## 2024-06-19 NOTE — Telephone Encounter (Unsigned)
 Copied from CRM #[id number]. Topic: Clinical - Medication Refill >> Jun 19, 2024 11:47 AM [name] wrote: Medication: LORazepam  (ATIVAN ) 1 MG tablet * prescribed by her previous PCP  Has the patient contacted their pharmacy? Yes, call dr  This is the patient's preferred pharmacy:  [facility] [phone] - [place],  - [address] AT [facility] OF [address] & [address] [place] [address] Phone: [phone] Fax: [phone]  Is this the correct pharmacy for this prescription? Yes If no, delete pharmacy and type the correct one.   Has the prescription been filled recently? No  Is the patient out of the medication? No  Has the patient been seen for an appointment in the last year OR does the patient have an upcoming appointment? Yes  Can we respond through MyChart? No  Agent: Please be advised that Rx refills may take up to 3 business days. We ask that you follow-up with your pharmacy.

## 2024-06-20 MED ORDER — LORAZEPAM 1 MG PO TABS: 1.0000 mg | ORAL_TABLET | Freq: Every day | ORAL | 0 refills | Status: AC

## 2024-07-03 ENCOUNTER — Ambulatory Visit

## 2024-12-12 ENCOUNTER — Ambulatory Visit

## 2024-12-24 ENCOUNTER — Other Ambulatory Visit

## 2024-12-31 ENCOUNTER — Ambulatory Visit: Admitting: Physician Assistant
